# Patient Record
Sex: Male | Born: 1980 | Race: White | Hispanic: No | Marital: Single | Smoking: Current every day smoker
Health system: Southern US, Community
[De-identification: ages and names within clinical notes are randomized; demographics above are authoritative.]

## PROBLEM LIST (undated history)

## (undated) DIAGNOSIS — E785 Hyperlipidemia, unspecified: Secondary | ICD-10-CM

## (undated) DIAGNOSIS — F1010 Alcohol abuse, uncomplicated: Secondary | ICD-10-CM

## (undated) DIAGNOSIS — I609 Nontraumatic subarachnoid hemorrhage, unspecified: Secondary | ICD-10-CM

## (undated) DIAGNOSIS — Z765 Malingerer [conscious simulation]: Secondary | ICD-10-CM

## (undated) DIAGNOSIS — D1802 Hemangioma of intracranial structures: Secondary | ICD-10-CM

## (undated) DIAGNOSIS — G8929 Other chronic pain: Secondary | ICD-10-CM

## (undated) DIAGNOSIS — R45851 Suicidal ideations: Secondary | ICD-10-CM

---

## 2021-03-28 ENCOUNTER — Emergency Department (HOSPITAL_COMMUNITY): Payer: PRIVATE HEALTH INSURANCE

## 2021-03-28 ENCOUNTER — Inpatient Hospital Stay (HOSPITAL_COMMUNITY)
Admission: EM | Admit: 2021-03-28 | Discharge: 2021-04-02 | DRG: 082 | Disposition: A | Discharge status: Home / Self Care | Payer: PRIVATE HEALTH INSURANCE | Attending: Family Medicine | Admitting: Family Medicine

## 2021-03-28 ENCOUNTER — Emergency Department (HOSPITAL_COMMUNITY)
Admission: EM | Admit: 2021-03-28 | Discharge: 2021-03-28 | Discharge status: Absent without leave | Payer: Self-pay | Source: Home / Self Care | Attending: Emergency Medicine | Admitting: Emergency Medicine

## 2021-03-28 ENCOUNTER — Inpatient Hospital Stay (HOSPITAL_COMMUNITY): Payer: PRIVATE HEALTH INSURANCE

## 2021-03-28 ENCOUNTER — Encounter (HOSPITAL_COMMUNITY): Payer: Self-pay

## 2021-03-28 ENCOUNTER — Other Ambulatory Visit: Payer: Self-pay

## 2021-03-28 DIAGNOSIS — S0990XA Unspecified injury of head, initial encounter: Secondary | ICD-10-CM

## 2021-03-28 DIAGNOSIS — Z681 Body mass index (BMI) 19 or less, adult: Secondary | ICD-10-CM

## 2021-03-28 DIAGNOSIS — F10931 Alcohol use, unspecified with withdrawal delirium: Secondary | ICD-10-CM | POA: Diagnosis present

## 2021-03-28 DIAGNOSIS — S066X9A Traumatic subarachnoid hemorrhage with loss of consciousness of unspecified duration, initial encounter: Secondary | ICD-10-CM | POA: Diagnosis present

## 2021-03-28 DIAGNOSIS — I609 Nontraumatic subarachnoid hemorrhage, unspecified: Secondary | ICD-10-CM | POA: Diagnosis not present

## 2021-03-28 DIAGNOSIS — E871 Hypo-osmolality and hyponatremia: Secondary | ICD-10-CM | POA: Diagnosis present

## 2021-03-28 DIAGNOSIS — Z23 Encounter for immunization: Secondary | ICD-10-CM | POA: Diagnosis not present

## 2021-03-28 DIAGNOSIS — Z87891 Personal history of nicotine dependence: Secondary | ICD-10-CM

## 2021-03-28 DIAGNOSIS — I10 Essential (primary) hypertension: Secondary | ICD-10-CM | POA: Diagnosis present

## 2021-03-28 DIAGNOSIS — E43 Unspecified severe protein-calorie malnutrition: Secondary | ICD-10-CM | POA: Diagnosis present

## 2021-03-28 DIAGNOSIS — Z79899 Other long term (current) drug therapy: Secondary | ICD-10-CM | POA: Diagnosis not present

## 2021-03-28 DIAGNOSIS — Y92481 Parking lot as the place of occurrence of the external cause: Secondary | ICD-10-CM

## 2021-03-28 DIAGNOSIS — Y92009 Unspecified place in unspecified non-institutional (private) residence as the place of occurrence of the external cause: Secondary | ICD-10-CM

## 2021-03-28 DIAGNOSIS — W1839XA Other fall on same level, initial encounter: Secondary | ICD-10-CM | POA: Diagnosis present

## 2021-03-28 DIAGNOSIS — S0003XA Contusion of scalp, initial encounter: Secondary | ICD-10-CM | POA: Diagnosis present

## 2021-03-28 DIAGNOSIS — Z20822 Contact with and (suspected) exposure to covid-19: Secondary | ICD-10-CM | POA: Diagnosis present

## 2021-03-28 DIAGNOSIS — F10231 Alcohol dependence with withdrawal delirium: Principal | ICD-10-CM | POA: Diagnosis present

## 2021-03-28 DIAGNOSIS — E876 Hypokalemia: Secondary | ICD-10-CM | POA: Diagnosis present

## 2021-03-28 DIAGNOSIS — W19XXXA Unspecified fall, initial encounter: Secondary | ICD-10-CM

## 2021-03-28 DIAGNOSIS — R519 Headache, unspecified: Secondary | ICD-10-CM | POA: Diagnosis present

## 2021-03-28 LAB — CBC WITH DIFFERENTIAL/PLATELET
Abs Immature Granulocytes: 0.03 10*3/uL (ref 0.00–0.07)
Basophils Absolute: 0 10*3/uL (ref 0.0–0.1)
Basophils Relative: 0 %
Eosinophils Absolute: 0 10*3/uL (ref 0.0–0.5)
Eosinophils Relative: 0 %
HCT: 45.4 % (ref 39.0–52.0)
Hemoglobin: 16.1 g/dL (ref 13.0–17.0)
Immature Granulocytes: 0 %
Lymphocytes Relative: 9 %
Lymphs Abs: 0.8 10*3/uL (ref 0.7–4.0)
MCH: 32.2 pg (ref 26.0–34.0)
MCHC: 35.5 g/dL (ref 30.0–36.0)
MCV: 90.8 fL (ref 80.0–100.0)
Monocytes Absolute: 0.9 10*3/uL (ref 0.1–1.0)
Monocytes Relative: 10 %
Neutro Abs: 7.3 10*3/uL (ref 1.7–7.7)
Neutrophils Relative %: 81 %
Platelets: 120 10*3/uL — ABNORMAL LOW (ref 150–400)
RBC: 5 MIL/uL (ref 4.22–5.81)
RDW: 12.5 % (ref 11.5–15.5)
WBC: 9.1 10*3/uL (ref 4.0–10.5)
nRBC: 0 % (ref 0.0–0.2)

## 2021-03-28 LAB — CBC
HCT: 40.1 % (ref 39.0–52.0)
Hemoglobin: 13.9 g/dL (ref 13.0–17.0)
MCH: 31.9 pg (ref 26.0–34.0)
MCHC: 34.7 g/dL (ref 30.0–36.0)
MCV: 92 fL (ref 80.0–100.0)
Platelets: 104 10*3/uL — ABNORMAL LOW (ref 150–400)
RBC: 4.36 MIL/uL (ref 4.22–5.81)
RDW: 12.5 % (ref 11.5–15.5)
WBC: 6.2 10*3/uL (ref 4.0–10.5)
nRBC: 0 % (ref 0.0–0.2)

## 2021-03-28 LAB — HIV ANTIBODY (ROUTINE TESTING W REFLEX): HIV Screen 4th Generation wRfx: NONREACTIVE

## 2021-03-28 LAB — COMPREHENSIVE METABOLIC PANEL
ALT: 42 U/L (ref 0–44)
AST: 50 U/L — ABNORMAL HIGH (ref 15–41)
Albumin: 4.5 g/dL (ref 3.5–5.0)
Alkaline Phosphatase: 94 U/L (ref 38–126)
Anion gap: 19 — ABNORMAL HIGH (ref 5–15)
BUN: 6 mg/dL (ref 6–20)
CO2: 23 mmol/L (ref 22–32)
Calcium: 9.4 mg/dL (ref 8.9–10.3)
Chloride: 87 mmol/L — ABNORMAL LOW (ref 98–111)
Creatinine, Ser: 0.69 mg/dL (ref 0.61–1.24)
GFR, Estimated: >60 mL/min (ref >60)
Glucose, Bld: 142 mg/dL — ABNORMAL HIGH (ref 70–99)
Potassium: 3 mmol/L — ABNORMAL LOW (ref 3.5–5.1)
Sodium: 129 mmol/L — ABNORMAL LOW (ref 135–145)
Total Bilirubin: 2.8 mg/dL — ABNORMAL HIGH (ref 0.3–1.2)
Total Protein: 7.8 g/dL (ref 6.5–8.1)

## 2021-03-28 LAB — CREATININE, SERUM
Creatinine, Ser: 0.58 mg/dL — ABNORMAL LOW (ref 0.61–1.24)
GFR, Estimated: >60 mL/min (ref >60)

## 2021-03-28 LAB — URINALYSIS, ROUTINE W REFLEX MICROSCOPIC
Bilirubin Urine: NEGATIVE
Glucose, UA: NEGATIVE mg/dL
Ketones, ur: 5 mg/dL — AB
Leukocytes,Ua: NEGATIVE
Nitrite: NEGATIVE
Protein, ur: 100 mg/dL — AB
Specific Gravity, Urine: >1.046 — ABNORMAL HIGH (ref 1.005–1.030)
pH: 6 (ref 5.0–8.0)

## 2021-03-28 LAB — PHOSPHORUS: Phosphorus: 2.7 mg/dL (ref 2.5–4.6)

## 2021-03-28 LAB — MRSA NEXT GEN BY PCR, NASAL: MRSA by PCR Next Gen: NOT DETECTED

## 2021-03-28 LAB — MAGNESIUM: Magnesium: 1.5 mg/dL — ABNORMAL LOW (ref 1.7–2.4)

## 2021-03-28 LAB — ETHANOL: Alcohol, Ethyl (B): <10 mg/dL (ref <10)

## 2021-03-28 MED ORDER — ORAL CARE MOUTH RINSE
15.0000 mL | Freq: Two times a day (BID) | OROMUCOSAL | Status: DC
  Administered 2021-03-29 – 2021-04-02 (×6): 15 mL via OROMUCOSAL

## 2021-03-28 MED ORDER — POLYETHYLENE GLYCOL 3350 17 G PO PACK: 17.0000 g | PACK | Freq: Every day | ORAL | Status: DC | PRN

## 2021-03-28 MED ORDER — ENOXAPARIN SODIUM 40 MG/0.4ML IJ SOSY: 40.0000 mg | PREFILLED_SYRINGE | INTRAMUSCULAR | Status: DC

## 2021-03-28 MED ORDER — SODIUM CHLORIDE 0.9 % IV BOLUS
500.0000 mL | Freq: Once | INTRAVENOUS | Status: AC
  Administered 2021-03-28: 500 mL via INTRAVENOUS

## 2021-03-28 MED ORDER — POTASSIUM CHLORIDE CRYS ER 20 MEQ PO TBCR: 40.0000 meq | EXTENDED_RELEASE_TABLET | Freq: Two times a day (BID) | ORAL | Status: DC

## 2021-03-28 MED ORDER — PANTOPRAZOLE SODIUM 40 MG PO TBEC: 40.0000 mg | DELAYED_RELEASE_TABLET | Freq: Every day | ORAL | Status: DC

## 2021-03-28 MED ORDER — ADULT MULTIVITAMIN W/MINERALS CH: 1.0000 | ORAL_TABLET | Freq: Every day | ORAL | Status: DC

## 2021-03-28 MED ORDER — SODIUM CHLORIDE 0.9 % IV SOLN: INTRAVENOUS | Status: DC

## 2021-03-28 MED ORDER — SODIUM CHLORIDE (PF) 0.9 % IJ SOLN
INTRAMUSCULAR | Status: AC
  Filled 2021-03-28: qty 50

## 2021-03-28 MED ORDER — ENSURE ENLIVE PO LIQD
237.0000 mL | Freq: Two times a day (BID) | ORAL | Status: DC
  Administered 2021-03-29: 237 mL via ORAL

## 2021-03-28 MED ORDER — CHLORHEXIDINE GLUCONATE CLOTH 2 % EX PADS
6.0000 | MEDICATED_PAD | Freq: Every day | CUTANEOUS | Status: DC
  Administered 2021-03-28 – 2021-03-31 (×4): 6 via TOPICAL

## 2021-03-28 MED ORDER — LORAZEPAM 1 MG PO TABS: 1.0000 mg | ORAL_TABLET | ORAL | Status: DC | PRN

## 2021-03-28 MED ORDER — LORAZEPAM 2 MG/ML IJ SOLN: 0.0000 mg | Freq: Two times a day (BID) | INTRAMUSCULAR | Status: DC

## 2021-03-28 MED ORDER — LORAZEPAM 2 MG/ML IJ SOLN
0.0000 mg | Freq: Four times a day (QID) | INTRAMUSCULAR | Status: DC
  Filled 2021-03-28: qty 2

## 2021-03-28 MED ORDER — FOLIC ACID 1 MG PO TABS: 1.0000 mg | ORAL_TABLET | Freq: Every day | ORAL | Status: DC

## 2021-03-28 MED ORDER — DEXTROSE IN LACTATED RINGERS 5 % IV SOLN: INTRAVENOUS | Status: DC

## 2021-03-28 MED ORDER — DOCUSATE SODIUM 100 MG PO CAPS: 100.0000 mg | ORAL_CAPSULE | Freq: Two times a day (BID) | ORAL | Status: DC | PRN

## 2021-03-28 MED ORDER — ADULT MULTIVITAMIN LIQUID CH
15.0000 mL | Freq: Every day | ORAL | Status: DC
  Administered 2021-03-28 – 2021-03-29 (×2): 15 mL via ORAL
  Filled 2021-03-28 (×2): qty 15

## 2021-03-28 MED ORDER — THIAMINE HCL 100 MG/ML IJ SOLN
100.0000 mg | Freq: Every day | INTRAMUSCULAR | Status: DC
  Administered 2021-03-28: 100 mg via INTRAVENOUS
  Filled 2021-03-28: qty 2

## 2021-03-28 MED ORDER — FOLIC ACID 1 MG PO TABS
1.0000 mg | ORAL_TABLET | Freq: Every day | ORAL | Status: DC
  Administered 2021-03-29 – 2021-04-02 (×5): 1 mg via ORAL
  Filled 2021-03-28 (×6): qty 1

## 2021-03-28 MED ORDER — NIMODIPINE 6 MG/ML PO SOLN
60.0000 mg | ORAL | Status: DC
  Administered 2021-03-28 – 2021-03-29 (×6): 60 mg via ORAL
  Filled 2021-03-28 (×9): qty 10

## 2021-03-28 MED ORDER — MAGNESIUM SULFATE 2 GM/50ML IV SOLN
2.0000 g | Freq: Once | INTRAVENOUS | Status: AC
  Administered 2021-03-28: 2 g via INTRAVENOUS
  Filled 2021-03-28: qty 50

## 2021-03-28 MED ORDER — LABETALOL HCL 5 MG/ML IV SOLN
20.0000 mg | INTRAVENOUS | Status: DC | PRN
  Administered 2021-03-30: 20 mg via INTRAVENOUS
  Filled 2021-03-28: qty 4

## 2021-03-28 MED ORDER — THIAMINE HCL 100 MG/ML IJ SOLN: 100.0000 mg | Freq: Every day | INTRAMUSCULAR | Status: DC

## 2021-03-28 MED ORDER — IOHEXOL 350 MG/ML SOLN
100.0000 mL | Freq: Once | INTRAVENOUS | Status: AC | PRN
  Administered 2021-03-28: 100 mL via INTRAVENOUS

## 2021-03-28 MED ORDER — LORAZEPAM 2 MG/ML IJ SOLN
1.0000 mg | Freq: Once | INTRAMUSCULAR | Status: DC
Start: 2021-03-28 — End: 2021-03-28
  Filled 2021-03-28: qty 1

## 2021-03-28 MED ORDER — POTASSIUM CHLORIDE 20 MEQ PO PACK
40.0000 meq | PACK | ORAL | Status: AC
  Administered 2021-03-28 (×2): 40 meq via ORAL
  Filled 2021-03-28 (×2): qty 2

## 2021-03-28 MED ORDER — LORAZEPAM 2 MG/ML IJ SOLN
1.0000 mg | INTRAMUSCULAR | Status: DC | PRN
Start: 2021-03-28 — End: 2021-03-29
  Administered 2021-03-28 (×2): 1 mg via INTRAVENOUS

## 2021-03-28 MED ORDER — LORAZEPAM 2 MG/ML IJ SOLN: 1.0000 mg | INTRAMUSCULAR | Status: DC | PRN

## 2021-03-28 MED ORDER — THIAMINE HCL 100 MG PO TABS: 100.0000 mg | ORAL_TABLET | Freq: Every day | ORAL | Status: DC

## 2021-03-28 MED ORDER — DEXMEDETOMIDINE HCL IN NACL 200 MCG/50ML IV SOLN
0.2000 ug/kg/h | INTRAVENOUS | Status: DC
  Administered 2021-03-28 – 2021-03-29 (×2): 0.2 ug/kg/h via INTRAVENOUS
  Filled 2021-03-28 (×3): qty 50

## 2021-03-28 MED ORDER — PNEUMOCOCCAL VAC POLYVALENT 25 MCG/0.5ML IJ INJ
0.5000 mL | INJECTION | INTRAMUSCULAR | Status: DC
  Filled 2021-03-28: qty 0.5

## 2021-03-28 MED ORDER — PANTOPRAZOLE SODIUM 40 MG PO PACK
40.0000 mg | PACK | Freq: Every day | ORAL | Status: DC
  Administered 2021-03-28: 40 mg
  Filled 2021-03-28 (×2): qty 20

## 2021-03-28 MED ORDER — LEVETIRACETAM 100 MG/ML PO SOLN
500.0000 mg | Freq: Two times a day (BID) | ORAL | Status: DC
  Administered 2021-03-28 – 2021-03-29 (×3): 500 mg via ORAL
  Filled 2021-03-28 (×4): qty 5

## 2021-03-28 MED ORDER — THIAMINE HCL 100 MG PO TABS
100.0000 mg | ORAL_TABLET | Freq: Every day | ORAL | Status: DC
  Administered 2021-03-29 – 2021-04-02 (×5): 100 mg via ORAL
  Filled 2021-03-28 (×5): qty 1

## 2021-03-28 MED ORDER — LORAZEPAM 2 MG/ML IJ SOLN
0.0000 mg | Freq: Four times a day (QID) | INTRAMUSCULAR | Status: DC
  Administered 2021-03-28: 1 mg via INTRAVENOUS
  Administered 2021-03-28: 2 mg via INTRAVENOUS
  Administered 2021-03-28: 3 mg via INTRAVENOUS
  Filled 2021-03-28 (×3): qty 1

## 2021-03-28 NOTE — Progress Notes (Signed)
[facility] Progress Note Patient Name: Charles Graves DOB: 1981/03/10 MRN: [id number]   Date of Service  03/28/2021  HPI/Events of Note  Patient not tested for COVID on admission. Nursing request for order for COVID screening test. Patient asymptomatic.   eICU Interventions  Plan: 6 hour TAT PCR COVID test.      Intervention Category Major Interventions: Other:  [name] 03/28/2021, 9:16 PM

## 2021-03-28 NOTE — ED Provider Notes (Signed)
[facility] DEPT Provider Note   CSN: [id number] Arrival date & time: 03/28/21  3532     History No chief complaint on file.   Charles Graves is a 40 y.o. male.  Level 5 caveat for altered mental status.  Patient brought by EMS after fall.  Fall apparently occurred 12 hours ago.  Patient does not recall details.  States he fell backwards and struck his head and his having pain in his head and upper back.  States he was trying to detox from alcohol on his own and has not had anything to drink in the past 5 days.  He normally drinks 12 pack of beer a day.  States he did have 3 beers last night and attempt to sleep.  He endorses feeling shaky and believes someone is spying on him and he is seeing someone at the doorway to his room.  He denies any other drug use.  States he has a history of hypertension but has not been a has an unloader pain for a month or more.  Denies any chest pain or shortness of breath.  No abdominal pain, nausea or vomiting.  No focal weakness, numbness or tingling.  The history is provided by the patient.      No past medical history on file.  There are no problems to display for this patient.   * The histories are not reviewed yet. Please review them in the "History" navigator section and refresh this [place].     No family history on file.     Home Medications Prior to Admission medications   Not on File    Allergies    Patient has no allergy information on record.  Review of Systems   Review of Systems  Unable to perform ROS: Mental status change   Physical Exam Updated Vital Signs BP (!) 189/129   Pulse (!) 145   Temp 98 F (36.7 C)   Resp (!) 22   SpO2 100%   Physical Exam Vitals and nursing note reviewed.  Constitutional:      General: He is in acute distress.     Appearance: He is well-developed.     Comments: Tremulous and paranoid.  Tachycardic to the 150s. Refusing to stay in bed because he is  concerned someone is trying to look through the doorway at him  HENT:     Head: Normocephalic.     Comments: 3 cm hematoma and abrasion to the left occipital scalp    Mouth/Throat:     Pharynx: No oropharyngeal exudate.  Eyes:     Conjunctiva/sclera: Conjunctivae normal.     Pupils: Pupils are equal, round, and reactive to light.  Neck:     Comments: No meningismus. Cardiovascular:     Rate and Rhythm: Regular rhythm.     Heart sounds: Normal heart sounds. No murmur heard. Pulmonary:     Effort: Pulmonary effort is normal. No respiratory distress.     Breath sounds: Normal breath sounds.  Abdominal:     Palpations: Abdomen is soft.     Tenderness: There is no abdominal tenderness. There is no guarding or rebound.  Musculoskeletal:        General: Tenderness present. Normal range of motion.     Cervical back: Normal range of motion and neck supple.     Comments: Abrasion to mid thoracic spine no step-off  Skin:    General: Skin is warm.  Neurological:     Mental Status: He  is alert.     Cranial Nerves: No cranial nerve deficit.     Motor: No abnormal muscle tone.     Coordination: Coordination normal.     Comments: Oriented to person place and time but having hallucinations.  Answers questions appropriately but is seeing people in the room who are not there.  Moves all extremities equally.  5/5 strength throughout, cranial nerves II to XII intact  Psychiatric:        Behavior: Behavior normal.    ED Results / Procedures / Treatments   Labs (all labs ordered are listed, but only abnormal results are displayed) Labs Reviewed  CBC WITH DIFFERENTIAL/PLATELET - Abnormal; Notable for the following components:      Result Value   Platelets 120 (*)    All other components within normal limits  COMPREHENSIVE METABOLIC PANEL - Abnormal; Notable for the following components:   Sodium 129 (*)    Potassium 3.0 (*)    Chloride 87 (*)    Glucose, Bld 142 (*)    AST 50 (*)    Total  Bilirubin 2.8 (*)    Anion gap 19 (*)    All other components within normal limits  MAGNESIUM - Abnormal; Notable for the following components:   Magnesium 1.5 (*)    All other components within normal limits  ETHANOL  PHOSPHORUS    EKG None  Radiology No results found.  Procedures .Critical Care  Date/Time: 03/28/2021 7:26 AM Performed by: Ezequiel Essex, MD Authorized by: Ezequiel Essex, MD   Critical care provider statement:    Critical care time (minutes):  60   Critical care was necessary to treat or prevent imminent or life-threatening deterioration of the following conditions: delirium tremens.   Critical care was time spent personally by me on the following activities:  Discussions with consultants, evaluation of patient's response to treatment, examination of patient, ordering and performing treatments and interventions, ordering and review of laboratory studies, ordering and review of radiographic studies, pulse oximetry, re-evaluation of patient's condition, obtaining history from patient or surrogate and review of old charts   Medications Ordered in ED Medications  LORazepam (ATIVAN) tablet 1-4 mg (has no administration in time range)    Or  LORazepam (ATIVAN) injection 1-4 mg (has no administration in time range)  thiamine tablet 100 mg (has no administration in time range)    Or  thiamine (B-1) injection 100 mg (has no administration in time range)  folic acid (FOLVITE) tablet 1 mg (has no administration in time range)  multivitamin with minerals tablet 1 tablet (has no administration in time range)  LORazepam (ATIVAN) injection 0-4 mg (has no administration in time range)    Followed by  LORazepam (ATIVAN) injection 0-4 mg (has no administration in time range)    ED Course  I have reviewed the triage vital signs and the nursing notes.  Pertinent labs & imaging results that were available during my care of the patient were reviewed by me and considered  in my medical decision making (see chart for details).    MDM Rules/Calculators/A&P                         Fall with alcohol withdrawals and delirium tremens.  Patient is tachycardic, tremulous, hypertensive, hallucinating.  IV ativan, CIWA Protocol initiated.  CT head, C spine, T spine.   IV fluids, thiamine folate.  Patient paranoid, hallucinating, cowering in corner of room. Believes his ex-coworker is "around  the corner with two guns". CIWA scores elevated.  Patient has received 6 mg ativan so far with some improvement.  Will initiate precedex gttt.  Admission d/w Theadora Rama NP for CCM.  CT head delayed. Found to have small SAH, likely post traumatic but concern for possible vascular density at R MCA. CTA recommended to r/o aneurysm.  Critical care team aware and will arrange transfer to [facility] neuro ICU. Protecting airway.    Final Clinical Impression(s) / ED Diagnoses Final diagnoses:  Delirium tremens ([place])  Fall in home, initial encounter  Closed head injury, initial encounter    Rx / DC Orders ED Discharge Orders     None        Darnette Lampron, Annie Main, MD 03/28/21 [phone]

## 2021-03-28 NOTE — Progress Notes (Signed)
Unable to get a bed at neuro ICU at present  Will admit to [facility] ICU

## 2021-03-28 NOTE — Progress Notes (Signed)
Ct head shows no aneurysmal dilatation

## 2021-03-28 NOTE — ED Triage Notes (Signed)
Pt to ED by EMS from hotel he resides in. Pt was sitting on a curb yesterday around 1700 when he fell backwards from a sitting position and hit his head, mild bleeding and moderate swelling noted. Pt is also actively withdrawing from alcohol and arrives with a considerable tremor and audial and visual hallucinations.

## 2021-03-28 NOTE — ED Triage Notes (Signed)
Pt to ED by EMS from [facility] with c/o fall. Pt was sitting on a curb last night when he fell backwards and struck the back of his head, denies any LOC or neck or back pain. Pt is currently going through alcohol withdrawal and is experiencing visual hallucinations. Fall occurred at [place] yesterday.

## 2021-03-28 NOTE — H&P (Signed)
NAME:  Charles Graves, MRN:  [id number], DOB:  [birth year], LOS: 0 ADMISSION DATE:  03/28/2021, CONSULTATION DATE:  03/28/2021 REFERRING MD:  Dr Wyvonnia Dusky, CHIEF COMPLAINT:  Alcohol withdrawal with Delirium tremens   History of Present Illness:  Patient brought in by EMS following a fall Quirt drinking cold [place] 5 days ago Having auditory and visual hallucinations [place] about 12 hours prior to presentation  Hypertensive, tachycardic  Denies any pain, no focal weakness on exam  History of HTN  Pertinent  Medical History  HTN  Significant Hospital Events: Including procedures, antibiotic start and stop dates in addition to other pertinent events   SAH on CT head  Interim History / Subjective:  Feels fair, no pain or discomfort, denies a HA t present  Objective   Blood pressure 128/86, pulse (!) 126, temperature 98 F (36.7 C), resp. rate (!) 24, height 5\' 6"  (1.676 m), weight 59.9 kg, SpO2 99 %.       No intake or output data in the 24 hours ending 03/28/21 0805 Filed Weights   03/28/21 0722  Weight: 59.9 kg    Examination: General: middle aged, comfortable HENT: moist oral mucosa Lungs: clear breath sounds Cardiovascular: S1S2 appreciated Abdomen: BS appreciated, no hepatomegaly Extremities: no edema Neuro: awake, alert, moving all extremities, following commands GU: fair output  Resolved Hospital Problem list     Assessment & Plan:  Delirium tremens -hallucinations,tremulous,tachycardic, hypertensive  Alcohol withdrawal - daily drinker, quit 5 days ago cold [place]  Sub arachnoid hemorrhage secondary to fall - concern for aneurysm  Hx of HTN - was on Amlodipine  . Admit to neuro ICU . Obtain CTA . Precedex . Correct lytes . Anti seizure prophylaxis . Nimotop . BP control- MAP<160 . Stool softeners . MVI . Thiamine . Analgesia as needed . Saline drip  Easily arousable and interactive, able to protect his airway    Best Practice (right click and  "Reselect all SmartList Selections" daily)   Diet/type: Regular consistency (see orders) DVT prophylaxis: SCD GI prophylaxis: PPI Lines: N/A Foley:  N/A Code Status:  full code Last date of multidisciplinary goals of care discussion [pending]  Labs   CBC: Recent Labs  Lab 03/28/21 0610  WBC 9.1  NEUTROABS 7.3  HGB 16.1  HCT 45.4  MCV 90.8  PLT 120*    Basic Metabolic Panel: Recent Labs  Lab 03/28/21 0610  NA 129*  K 3.0*  CL 87*  CO2 23  GLUCOSE 142*  BUN 6  CREATININE 0.69  CALCIUM 9.4  MG 1.5*  PHOS 2.7   GFR: Estimated Creatinine Clearance: 103 mL/min (by C-G formula based on SCr of 0.69 mg/dL). Recent Labs  Lab 03/28/21 0610  WBC 9.1    Liver Function Tests: Recent Labs  Lab 03/28/21 0610  AST 50*  ALT 42  ALKPHOS 94  BILITOT 2.8*  PROT 7.8  ALBUMIN 4.5   No results for input(s): LIPASE, AMYLASE in the last 168 hours. No results for input(s): AMMONIA in the last 168 hours.  ABG No results found for: PHART, PCO2ART, PO2ART, HCO3, TCO2, ACIDBASEDEF, O2SAT   Coagulation Profile: No results for input(s): INR, PROTIME in the last 168 hours.  Cardiac Enzymes: No results for input(s): CKTOTAL, CKMB, CKMBINDEX, TROPONINI in the last 168 hours.  HbA1C: No results found for: HGBA1C  CBG: No results for input(s): GLUCAP in the last 168 hours.  Review of Systems:   Denies HA, no pain or discomfort  Past Medical History:  He,  has no past medical history on file.   Surgical History:  History reviewed. No pertinent surgical history.   Social History:      Family History:  His family history is not on file.   Allergies No Known Allergies   Home Medications  Prior to Admission medications   Not on File     Critical care time: 30    Risk of decompensation is very high with his SAH and alcohol withdrawal

## 2021-03-28 NOTE — ED Notes (Addendum)
Unable to obtain EKG due to extreme shaking.

## 2021-03-29 ENCOUNTER — Encounter (HOSPITAL_COMMUNITY): Payer: Self-pay | Admitting: Pulmonary Disease

## 2021-03-29 DIAGNOSIS — E43 Unspecified severe protein-calorie malnutrition: Secondary | ICD-10-CM | POA: Insufficient documentation

## 2021-03-29 LAB — CBC
HCT: 39 % (ref 39.0–52.0)
Hemoglobin: 13.2 g/dL (ref 13.0–17.0)
MCH: 32.2 pg (ref 26.0–34.0)
MCHC: 33.8 g/dL (ref 30.0–36.0)
MCV: 95.1 fL (ref 80.0–100.0)
Platelets: 100 10*3/uL — ABNORMAL LOW (ref 150–400)
RBC: 4.1 MIL/uL — ABNORMAL LOW (ref 4.22–5.81)
RDW: 12.7 % (ref 11.5–15.5)
WBC: 5.3 10*3/uL (ref 4.0–10.5)
nRBC: 0 % (ref 0.0–0.2)

## 2021-03-29 LAB — BASIC METABOLIC PANEL
Anion gap: 9 (ref 5–15)
BUN: 7 mg/dL (ref 6–20)
CO2: 22 mmol/L (ref 22–32)
Calcium: 8.4 mg/dL — ABNORMAL LOW (ref 8.9–10.3)
Chloride: 102 mmol/L (ref 98–111)
Creatinine, Ser: 0.42 mg/dL — ABNORMAL LOW (ref 0.61–1.24)
GFR, Estimated: >60 mL/min (ref >60)
Glucose, Bld: 94 mg/dL (ref 70–99)
Potassium: 3.4 mmol/L — ABNORMAL LOW (ref 3.5–5.1)
Sodium: 133 mmol/L — ABNORMAL LOW (ref 135–145)

## 2021-03-29 LAB — C DIFFICILE QUICK SCREEN W PCR REFLEX
C Diff antigen: NEGATIVE
C Diff interpretation: NOT DETECTED
C Diff toxin: NEGATIVE

## 2021-03-29 LAB — SARS CORONAVIRUS 2 (TAT 6-24 HRS): SARS Coronavirus 2: NEGATIVE

## 2021-03-29 MED ORDER — LORAZEPAM 1 MG PO TABS
1.0000 mg | ORAL_TABLET | Freq: Two times a day (BID) | ORAL | Status: AC
  Administered 2021-03-29 – 2021-03-30 (×4): 1 mg via ORAL
  Filled 2021-03-29 (×4): qty 1

## 2021-03-29 MED ORDER — POTASSIUM CHLORIDE CRYS ER 20 MEQ PO TBCR
40.0000 meq | EXTENDED_RELEASE_TABLET | Freq: Once | ORAL | Status: AC
  Administered 2021-03-29: 40 meq via ORAL
  Filled 2021-03-29: qty 2

## 2021-03-29 MED ORDER — ADULT MULTIVITAMIN W/MINERALS CH
1.0000 | ORAL_TABLET | Freq: Every day | ORAL | Status: DC
  Administered 2021-03-30 – 2021-04-02 (×4): 1 via ORAL
  Filled 2021-03-29 (×4): qty 1

## 2021-03-29 MED ORDER — PROSOURCE PLUS PO LIQD
30.0000 mL | Freq: Every day | ORAL | Status: DC
  Administered 2021-03-29 – 2021-03-31 (×3): 30 mL via ORAL
  Filled 2021-03-29 (×4): qty 30

## 2021-03-29 MED ORDER — PANTOPRAZOLE SODIUM 40 MG PO TBEC
40.0000 mg | DELAYED_RELEASE_TABLET | Freq: Every day | ORAL | Status: DC
  Administered 2021-03-29 – 2021-04-02 (×5): 40 mg via ORAL
  Filled 2021-03-29 (×5): qty 1

## 2021-03-29 MED ORDER — ENSURE ENLIVE PO LIQD
237.0000 mL | Freq: Two times a day (BID) | ORAL | Status: DC
  Administered 2021-03-29 – 2021-04-02 (×8): 237 mL via ORAL

## 2021-03-29 NOTE — Progress Notes (Signed)
Initial Nutrition Assessment  DOCUMENTATION CODES:   Underweight, Severe malnutrition in context of social or environmental circumstances  INTERVENTION:  - continue Ensure Enlive po BID, each supplement provides 350 kcal and 20 grams of protein - will order 30 ml Prosource Plus once/day, each supplement provides 100 kcal and 15 grams protein.    NUTRITION DIAGNOSIS:   Severe Malnutrition related to social / environmental circumstances (alcohol abuse) as evidenced by moderate fat depletion, moderate muscle depletion, severe fat depletion, severe muscle depletion.  GOAL:   Patient will meet greater than or equal to 90% of their needs  MONITOR:   PO intake, Supplement acceptance, Labs, Weight trends  REASON FOR ASSESSMENT:   Malnutrition Screening Tool  ASSESSMENT:   40 year-old male with medical history of alcohol abuse. Patient presented to the ED after a fall. He quit drinking cold [place] 5 days PTA.  Patient sitting in the chair with no family or visitors present. His responses are slightly delayed and his speech is slightly delayed.  Patient discussed in rounds this AM and with RN after visit to patient's room.   Patient reports that he ate 100% of breakfast this AM which consisted of pancakes and bacon. He denies abdominal pain/pressure or nausea after eating.   Prior to stopping drinking, he had a great appetite. He would graze throughout the day and eat a large dinner meal each day. Since he stopped drinking, he has been eating and drinking non-alcoholic beverages very minimally. He was feeling very nauseous during those days and experienced vomiting x1 after drinking chocolate milk.  He reports having some muscle cramp-like discomfort to calves intermittently PTA, but has not experienced that today. He reports severe R hip pain that "feels like it is breaking in half." Let RN know.  He weighs himself at home and the last time was 2 weeks ago at which time he weighed 133  lb. Reports UBW of 132-135 lb and that he has weighed this for a long time.   Weight yesterday documented as both 119 lb and 132 lb. No other weight recordings in the chart.    Labs reviewed; Na: 133 mmol/l, K: 3.4 mmol/l, creatinine: 0.42 mg/dl, Ca: 8.4 mg/dl. Medications reviewed; 1 mg folvite/day, 1 mg ativan BID, 1 tablet multivitamin with minerals/day, 40 mg oral protonix/day, 40 mEq Klor-Con x2 doses 7/18 and x1 dose 7/19, 100 mg oral thiamine/day.  IVF; NS @ 100 ml/hr.     NUTRITION - FOCUSED PHYSICAL EXAM:  Flowsheet Row Most Recent Value  Orbital Region Moderate depletion  Upper Arm Region Severe depletion  Thoracic and Lumbar Region Unable to assess  Buccal Region Moderate depletion  Temple Region Moderate depletion  Clavicle Bone Region Moderate depletion  Clavicle and Acromion Bone Region Severe depletion  Scapular Bone Region Unable to assess  Dorsal Hand Moderate depletion  Patellar Region Severe depletion  Anterior Thigh Region Severe depletion  Posterior Calf Region Severe depletion  Edema (RD Assessment) None  Hair Reviewed  Eyes Reviewed  Mouth Reviewed  Skin Reviewed  Nails Reviewed       Diet Order:   Diet Order             Diet Carb Modified Fluid consistency: Thin; Room service appropriate? Yes  Diet effective now                   EDUCATION NEEDS:   Not appropriate for education at this time  Skin:  Skin Assessment: Reviewed RN Assessment  Last  BM:  7/19 x2  Height:   Ht Readings from Last 1 Encounters:  03/28/21 5\' 8"  (1.727 m)    Weight:   Wt Readings from Last 1 Encounters:  03/28/21 54.2 kg      Estimated Nutritional Needs:  Kcal:  1800-2000 kcal Protein:  90-105 grams Fluid:  >/= 2 L/day     Jarome Matin, MS, RD, LDN, CNSC Inpatient Clinical Dietitian RD pager # available in [facility]  After hours/weekend pager # available in [facility]

## 2021-03-29 NOTE — Progress Notes (Signed)
Did discuss with neurosurgery, CT reviewed  Trivial amount of hemorrhage  Patient has remained hemodynamically stable  Will discontinue the Nimotop, discontinue Keppra  Continue management for his DTs  Will continue to follow closely

## 2021-03-29 NOTE — Progress Notes (Signed)
NAME:  Charles Graves, MRN:  [id number], DOB:  1980/12/19, LOS: 1 ADMISSION DATE:  03/28/2021, CONSULTATION DATE:  03/28/2021 REFERRING MD:  Dr Wyvonnia Dusky, CHIEF COMPLAINT:  Alcohol withdrawal with Delirium tremens   History of Present Illness:  Patient brought in by EMS following a fall Quirt drinking cold [place] 5 days ago Having auditory and visual hallucinations [place] about 12 hours prior to presentation  Hypertensive, tachycardic  Denies any pain, no focal weakness on exam  History of HTN  Pertinent  Medical History  HTN  Significant Hospital Events: Including procedures, antibiotic start and stop dates in addition to other pertinent events   SAH on CT head 7/19-stable  Interim History / Subjective:  No overnight events Still on low-dose Precedex Denies pain or discomfort, does not appear agitated  Objective   Blood pressure 100/79, pulse 61, temperature (!) 97.1 F (36.2 C), temperature source Axillary, resp. rate 11, height 5\' 8"  (1.727 m), weight 54.2 kg, SpO2 98 %.        Intake/Output Summary (Last 24 hours) at 03/29/2021 0750 Last data filed at 03/29/2021 0442 Gross per 24 hour  Intake 926.6 ml  Output 1000 ml  Net -73.4 ml   Filed Weights   03/28/21 0722 03/28/21 1755  Weight: 59.9 kg 54.2 kg    Examination: General: Middle-aged, comfortable HENT: Moist oral mucosa, anicteric Lungs: Clear breath sounds bilaterally Cardiovascular: S1-S2 appreciated, no murmur Abdomen: Bowel sounds appreciated Extremities: No clubbing, no edema Neuro: Moving all extremities with no focal findings GU: fair output  Resolved Hospital Problem list     Assessment & Plan:   Delirium tremens -Appears to be settling -Hypotension resolved, tachycardia resolved -Convert to oral medications  Alcohol withdrawal -Was a daily drinker, quit cold [place] 5 days prior to presentation  Subarachnoid hemorrhage secondary to fall -CTA negative for aneurysmal dilatation -On  antiseizure prophylaxis -On nimodipine GCS -15  History of hypertension -Continue to monitor  .  Wean off Precedex .  Ativan 1 mg p.o. twice daily .  Continue CIWA .  Blood pressure control, maintain MAP less than 160 .  Analgesia as needed .  Continue MVI .  Continue thiamine .  Advance diet  Best Practice (right click and "Reselect all SmartList Selections" daily)   Diet/type: Regular consistency (see orders) DVT prophylaxis: SCD GI prophylaxis: PPI Lines: N/A Foley:  N/A Code Status:  full code Last date of multidisciplinary goals of care discussion [pending]  Labs   CBC: Recent Labs  Lab 03/28/21 0610 03/28/21 1702 03/29/21 0253  WBC 9.1 6.2 5.3  NEUTROABS 7.3  --   --   HGB 16.1 13.9 13.2  HCT 45.4 40.1 39.0  MCV 90.8 92.0 95.1  PLT 120* 104* 100*    Basic Metabolic Panel: Recent Labs  Lab 03/28/21 0610 03/28/21 1702 03/29/21 0253  NA 129*  --  133*  K 3.0*  --  3.4*  CL 87*  --  102  CO2 23  --  22  GLUCOSE 142*  --  94  BUN 6  --  7  CREATININE 0.69 0.58* 0.42*  CALCIUM 9.4  --  8.4*  MG 1.5*  --   --   PHOS 2.7  --   --    GFR: Estimated Creatinine Clearance: 93.2 mL/min (A) (by C-G formula based on SCr of 0.42 mg/dL (L)). Recent Labs  Lab 03/28/21 0610 03/28/21 1702 03/29/21 0253  WBC 9.1 6.2 5.3    Liver Function Tests: Recent Labs  Lab 03/28/21 0610  AST 50*  ALT 42  ALKPHOS 94  BILITOT 2.8*  PROT 7.8  ALBUMIN 4.5   No results for input(s): LIPASE, AMYLASE in the last 168 hours. No results for input(s): AMMONIA in the last 168 hours.  ABG No results found for: PHART, PCO2ART, PO2ART, HCO3, TCO2, ACIDBASEDEF, O2SAT   Coagulation Profile: No results for input(s): INR, PROTIME in the last 168 hours.  Cardiac Enzymes: No results for input(s): CKTOTAL, CKMB, CKMBINDEX, TROPONINI in the last 168 hours.  HbA1C: No results found for: HGBA1C  CBG: No results for input(s): GLUCAP in the last 168 hours.  Review of  Systems:   Denies HA, no pain or discomfort  Past Medical History:  He,  has no past medical history on file.   Surgical History:  History reviewed. No pertinent surgical history.   Social History:      Family History:  His family history is not on file.   Allergies No Known Allergies   Home Medications  Prior to Admission medications   Not on File     Critical care time: 65

## 2021-03-29 NOTE — Progress Notes (Signed)
[facility] ADULT ICU REPLACEMENT PROTOCOL   The patient does apply for the [facility] Adult ICU Electrolyte Replacment Protocol based on the criteria listed below:   1.Exclusion criteria: TCTS patients, ECMO patients and Hypothermia Protocol, and   Dialysis patients 2. Is GFR >/= 30 ml/min? Yes.    Patient's GFR today is >60 3. Is SCr </= 2? No. Patient's SCr is 0.42 mg/dL 4. Did SCr increase >/= 0.5 in 24 hours? No. 5.Pt's weight >40kg  Yes.   6. Abnormal electrolyte(s): K+ 3.4  7. Electrolytes replaced per protocol 8.  Call MD STAT for K+ </= 2.5, Phos </= 1, or Mag </= 1 Physician:  n/a  Charles Graves 03/29/2021 4:02 AM

## 2021-03-30 ENCOUNTER — Encounter (HOSPITAL_COMMUNITY): Payer: Self-pay | Admitting: Pulmonary Disease

## 2021-03-30 DIAGNOSIS — E43 Unspecified severe protein-calorie malnutrition: Secondary | ICD-10-CM

## 2021-03-30 LAB — COMPREHENSIVE METABOLIC PANEL
ALT: 45 U/L — ABNORMAL HIGH (ref 0–44)
AST: 66 U/L — ABNORMAL HIGH (ref 15–41)
Albumin: 3.4 g/dL — ABNORMAL LOW (ref 3.5–5.0)
Alkaline Phosphatase: 77 U/L (ref 38–126)
Anion gap: 10 (ref 5–15)
BUN: 6 mg/dL (ref 6–20)
CO2: 23 mmol/L (ref 22–32)
Calcium: 8.6 mg/dL — ABNORMAL LOW (ref 8.9–10.3)
Chloride: 104 mmol/L (ref 98–111)
Creatinine, Ser: 0.51 mg/dL — ABNORMAL LOW (ref 0.61–1.24)
GFR, Estimated: >60 mL/min (ref >60)
Glucose, Bld: 99 mg/dL (ref 70–99)
Potassium: 3.1 mmol/L — ABNORMAL LOW (ref 3.5–5.1)
Sodium: 137 mmol/L (ref 135–145)
Total Bilirubin: 1 mg/dL (ref 0.3–1.2)
Total Protein: 5.9 g/dL — ABNORMAL LOW (ref 6.5–8.1)

## 2021-03-30 LAB — URINE CULTURE
Culture: <10000 — AB
Special Requests: NORMAL

## 2021-03-30 LAB — CBC WITH DIFFERENTIAL/PLATELET
Abs Immature Granulocytes: 0.02 10*3/uL (ref 0.00–0.07)
Basophils Absolute: 0 10*3/uL (ref 0.0–0.1)
Basophils Relative: 1 %
Eosinophils Absolute: 0.1 10*3/uL (ref 0.0–0.5)
Eosinophils Relative: 1 %
HCT: 38.7 % — ABNORMAL LOW (ref 39.0–52.0)
Hemoglobin: 13 g/dL (ref 13.0–17.0)
Immature Granulocytes: 0 %
Lymphocytes Relative: 25 %
Lymphs Abs: 1.3 10*3/uL (ref 0.7–4.0)
MCH: 31.9 pg (ref 26.0–34.0)
MCHC: 33.6 g/dL (ref 30.0–36.0)
MCV: 94.9 fL (ref 80.0–100.0)
Monocytes Absolute: 0.5 10*3/uL (ref 0.1–1.0)
Monocytes Relative: 10 %
Neutro Abs: 3.3 10*3/uL (ref 1.7–7.7)
Neutrophils Relative %: 63 %
Platelets: 130 10*3/uL — ABNORMAL LOW (ref 150–400)
RBC: 4.08 MIL/uL — ABNORMAL LOW (ref 4.22–5.81)
RDW: 12.8 % (ref 11.5–15.5)
WBC: 5.2 10*3/uL (ref 4.0–10.5)
nRBC: 0 % (ref 0.0–0.2)

## 2021-03-30 LAB — MAGNESIUM: Magnesium: 1.6 mg/dL — ABNORMAL LOW (ref 1.7–2.4)

## 2021-03-30 LAB — POTASSIUM: Potassium: 3.9 mmol/L (ref 3.5–5.1)

## 2021-03-30 MED ORDER — MAGNESIUM SULFATE 4 GM/100ML IV SOLN
4.0000 g | Freq: Once | INTRAVENOUS | Status: AC
  Administered 2021-03-30: 4 g via INTRAVENOUS
  Filled 2021-03-30: qty 100

## 2021-03-30 MED ORDER — POTASSIUM CHLORIDE CRYS ER 20 MEQ PO TBCR
40.0000 meq | EXTENDED_RELEASE_TABLET | Freq: Once | ORAL | Status: AC
  Administered 2021-03-30: 40 meq via ORAL
  Filled 2021-03-30: qty 2

## 2021-03-30 MED ORDER — AMLODIPINE BESYLATE 10 MG PO TABS
10.0000 mg | ORAL_TABLET | Freq: Every day | ORAL | Status: DC
  Administered 2021-03-30 – 2021-04-02 (×4): 10 mg via ORAL
  Filled 2021-03-30 (×4): qty 1

## 2021-03-30 MED ORDER — HYDRALAZINE HCL 20 MG/ML IJ SOLN
10.0000 mg | INTRAMUSCULAR | Status: AC | PRN
  Administered 2021-03-30 (×2): 20 mg via INTRAVENOUS
  Filled 2021-03-30 (×2): qty 1

## 2021-03-30 MED ORDER — POTASSIUM CHLORIDE 10 MEQ/100ML IV SOLN
10.0000 meq | INTRAVENOUS | Status: DC
  Administered 2021-03-30: 10 meq via INTRAVENOUS
  Filled 2021-03-30 (×2): qty 100

## 2021-03-30 MED ORDER — POTASSIUM CHLORIDE CRYS ER 10 MEQ PO TBCR
20.0000 meq | EXTENDED_RELEASE_TABLET | ORAL | Status: AC
  Administered 2021-03-30 (×2): 20 meq via ORAL
  Filled 2021-03-30: qty 1
  Filled 2021-03-30: qty 2

## 2021-03-30 NOTE — Progress Notes (Signed)
NAME:  Charles Graves, MRN:  [id number], DOB:  [birth year], LOS: 2 ADMISSION DATE:  03/28/2021, CONSULTATION DATE:  03/28/2021 REFERRING MD:  Dr Wyvonnia Dusky, CHIEF COMPLAINT:  Alcohol withdrawal with Delirium tremens   History of Present Illness:  Patient brought in by EMS following a fall Quit drinking cold [place] 5 days prior to presentation He was having auditory and visual hallucinations [place] about 12 hours prior to presentation  Hypertensive, tachycardic  Denies any pain, no focal weakness on exam  History of HTN  Pertinent  Medical History  HTN  Significant Hospital Events: Including procedures, antibiotic start and stop dates in addition to other pertinent events   SAH on CT head 7/19-stable  Interim History / Subjective:  No overnight events Discontinued low-dose Precedex On Ativan  Objective   Blood pressure (!) 172/121, pulse 86, temperature 97.6 F (36.4 C), temperature source Oral, resp. rate 12, height 5\' 8"  (1.727 m), weight 56.6 kg, SpO2 99 %.        Intake/Output Summary (Last 24 hours) at 03/30/2021 0845 Last data filed at 03/30/2021 0800 Gross per 24 hour  Intake 3841.12 ml  Output 2450 ml  Net 1391.12 ml   Filed Weights   03/28/21 0722 03/28/21 1755 03/30/21 0438  Weight: 59.9 kg 54.2 kg 56.6 kg    Examination: General: Middle-aged, comfortable HENT: Moist oral mucosa, anicteric Lungs: Clear breath sounds bilaterally Cardiovascular: S1-S2 appreciated, no murmur Abdomen: Bowel sounds appreciated Extremities: No clubbing, no edema Neuro: Moving all extremities with no focal findings GU: fair output  Resolved Hospital Problem list     Assessment & Plan:   Delirium tremens -Improving symptoms -On oral Ativan  Alcohol withdrawal -Daily drink adequate 5 days prior to presentation  Subarachnoid hemorrhage secondary to fall -Discussed and reviewed CT with neuro surgery -Very small hemorrhage -Antiseizure medications, pneumonia TPN  discontinued -GCS of 15  History of hypertension -Required hydralazine -We will initiate amlodipine  Continue Ativan 1 mg p.o. twice daily Replete electrolytes -On potassium replacement  Continue MVI, continue thiamine, advance diet  Transfer to medical floor  Best Practice (right click and "Reselect all SmartList Selections" daily)   Diet/type: Regular consistency (see orders) DVT prophylaxis: SCD GI prophylaxis: PPI Lines: N/A Foley:  N/A Code Status:  full code Last date of multidisciplinary goals of care discussion [pending]  Labs   CBC: Recent Labs  Lab 03/28/21 0610 03/28/21 1702 03/29/21 0253 03/30/21 0547  WBC 9.1 6.2 5.3 5.2  NEUTROABS 7.3  --   --  3.3  HGB 16.1 13.9 13.2 13.0  HCT 45.4 40.1 39.0 38.7*  MCV 90.8 92.0 95.1 94.9  PLT 120* 104* 100* 130*    Basic Metabolic Panel: Recent Labs  Lab 03/28/21 0610 03/28/21 1702 03/29/21 0253 03/30/21 0547  NA 129*  --  133* 137  K 3.0*  --  3.4* 3.1*  CL 87*  --  102 104  CO2 23  --  22 23  GLUCOSE 142*  --  94 99  BUN 6  --  7 6  CREATININE 0.69 0.58* 0.42* 0.51*  CALCIUM 9.4  --  8.4* 8.6*  MG 1.5*  --   --  1.6*  PHOS 2.7  --   --   --    GFR: Estimated Creatinine Clearance: 97.3 mL/min (A) (by C-G formula based on SCr of 0.51 mg/dL (L)). Recent Labs  Lab 03/28/21 0610 03/28/21 1702 03/29/21 0253 03/30/21 0547  WBC 9.1 6.2 5.3 5.2    Liver Function  Tests: Recent Labs  Lab 03/28/21 0610 03/30/21 0547  AST 50* 66*  ALT 42 45*  ALKPHOS 94 77  BILITOT 2.8* 1.0  PROT 7.8 5.9*  ALBUMIN 4.5 3.4*   No results for input(s): LIPASE, AMYLASE in the last 168 hours. No results for input(s): AMMONIA in the last 168 hours.  ABG No results found for: PHART, PCO2ART, PO2ART, HCO3, TCO2, ACIDBASEDEF, O2SAT   Coagulation Profile: No results for input(s): INR, PROTIME in the last 168 hours.  Cardiac Enzymes: No results for input(s): CKTOTAL, CKMB, CKMBINDEX, TROPONINI in the last 168  hours.  HbA1C: No results found for: HGBA1C  CBG: No results for input(s): GLUCAP in the last 168 hours.  Review of Systems:   Denies HA, no pain or discomfort  Past Medical History:  He,  has no past medical history on file.   Surgical History:  History reviewed. No pertinent surgical history.   Social History:   reports that he has quit smoking. His smoking use included cigarettes. He has never used smokeless tobacco. He reports current alcohol use. He reports previous drug use.   Family History:  His family history is not on file.   Allergies Allergies  Allergen Reactions   Oxycodone Hcl Swelling     Home Medications  Prior to Admission medications   Not on File    Sherrilyn Rist, MD [place] [facility] Pager: See Shea Evans

## 2021-03-30 NOTE — Progress Notes (Signed)
[facility] ADULT ICU REPLACEMENT PROTOCOL   The patient does apply for the [facility] Adult ICU Electrolyte Replacment Protocol based on the criteria listed below:   1.Exclusion criteria: TCTS patients, ECMO patients and Hypothermia Protocol, and   Dialysis patients 2. Is GFR >/= 30 ml/min? Yes.    Patient's GFR today is >60 3. Is SCr </= 2? Yes.   Patient's SCr is 0.51 mg/dL 4. Did SCr increase >/= 0.5 in 24 hours? No. 5.Pt's weight >40kg  Yes.   6. Abnormal electrolyte(s): K= 3.1, mag 1.6  7. Electrolytes replaced per protocol 8.  Call MD STAT for K+ </= 2.5, Phos </= 1, or Mag </= 1 Physician:  n/a  Charles Graves 03/30/2021 6:35 AM

## 2021-03-30 NOTE — TOC Initial Note (Signed)
Transition of Care [facility]) - Initial/Assessment Note    Patient Details  Name: Charles Graves MRN: [id number] Date of Birth: [birth year]  [facility] CM/SW Contact:    Leeroy Cha, RN Phone Number: 03/30/2021, 8:49 AM  Clinical Narrative:                 Patient brought in by EMS following a fall [place]irt drinking cold [place] 5 days ago Having auditory and visual hallucinations [place] about 12 hours prior to presentation   Hypertensive, tachycardic   Denies any pain, no focal weakness on exam   History of HTN   Pertinent  Medical History  HTN   Significant Hospital Events: Including procedures, antibiotic start and stop dates in addition to other pertinent events  SAH on CT head 7/19-stable TOC PLAN OF CARE: following for progression and toc needs.  Patient is from [employer]. no insurance.   Expected Discharge Plan: Home/Self Care Barriers to Discharge: Continued Medical Work up   Patient Goals and CMS Choice        Expected Discharge Plan and Services Expected Discharge Plan: Home/Self Care   Discharge Planning Services: CM Consult                                          Prior Living Arrangements/Services                       Activities of Daily Living Home Assistive Devices/Equipment: Eyeglasses ADL Screening (condition at time of admission) Patient's cognitive ability adequate to safely complete daily activities?: No Is the patient deaf or have difficulty hearing?: No Does the patient have difficulty seeing, even when wearing glasses/contacts?: No Does the patient have difficulty concentrating, remembering, or making decisions?: Yes Patient able to express need for assistance with ADLs?: Yes Does the patient have difficulty dressing or bathing?: No Independently performs ADLs?: Yes (appropriate for developmental age) Does the patient have difficulty walking or climbing stairs?: Yes Weakness of Legs: Both Weakness of  Arms/Hands: Both  Permission Sought/Granted                  Emotional Assessment Appearance:: Appears stated age     Orientation: : Fluctuating Orientation (Suspected and/or reported Sundowners) Alcohol / Substance Use: Alcohol Use, Illicit Drugs Psych Involvement: No (comment)  Admission diagnosis:  Subarachnoid hemorrhage ([place]) [I60.9] Delirium tremens ([place]) [F10.231] Closed head injury, initial encounter [S09.90XA] Fall in home, initial encounter [W19.Merril Abbe, E72.094] Patient Active Problem List   Diagnosis Date Noted   Protein-calorie malnutrition, severe 03/29/2021   Delirium tremens ([place]) 03/28/2021   Subarachnoid hemorrhage ([place]) 03/28/2021   PCP:  Pcp, No Pharmacy:   [id number] - [address], [place] [address] Phone: [phone] Fax: [phone]     Social Determinants of Health (SDOH) Interventions    Readmission Risk Interventions No flowsheet data found.

## 2021-03-30 NOTE — Progress Notes (Signed)
Pt received on the unit from ICU. Patient was oriented to the unit, as well as the phone and call bell. Pt appears comfortable at this time.

## 2021-03-30 NOTE — Progress Notes (Signed)
Patient complaining that left arm was hurting due to potassium infusion. Patient already received 20 meq PO. MD Olalere ordered to discontinue IV piggy back potassium order and order 40 meq PO once. Patient's K on morning BMET was 3.1.

## 2021-03-30 NOTE — Progress Notes (Signed)
[facility] Progress Note Patient Name: Charles Graves DOB: [birth year] MRN: [id number]   Date of Service  03/30/2021  HPI/Events of Note  Patient with sub-optimal blood pressure control.  eICU Interventions  PRN Hydralazine ordered.        [name] [name] 03/30/2021, 4:10 AM

## 2021-03-31 DIAGNOSIS — I609 Nontraumatic subarachnoid hemorrhage, unspecified: Secondary | ICD-10-CM

## 2021-03-31 DIAGNOSIS — E43 Unspecified severe protein-calorie malnutrition: Secondary | ICD-10-CM

## 2021-03-31 MED ORDER — LORAZEPAM 1 MG PO TABS
1.0000 mg | ORAL_TABLET | ORAL | Status: DC | PRN
  Administered 2021-04-01: 1 mg via ORAL
  Filled 2021-03-31: qty 1

## 2021-03-31 MED ORDER — ACETAMINOPHEN 325 MG PO TABS
650.0000 mg | ORAL_TABLET | Freq: Four times a day (QID) | ORAL | Status: DC | PRN
  Administered 2021-03-31 – 2021-04-01 (×2): 650 mg via ORAL
  Filled 2021-03-31: qty 2

## 2021-03-31 NOTE — Progress Notes (Signed)
PROGRESS NOTE    Charles Graves  KDX:[id number] DOB: September 03, 1981 DOA: 03/28/2021 PCP: Pcp, No    Brief Narrative:  Charles Graves was admitted to the hospital with alcohol withdrawal syndrome.   40 year old male who was brought to the hospital by EMS after experiencing a fall.  Apparently patient stopped drinking alcohol about 5 days ago he has been experiencing auditory and visual hallucinations for about 12 hours prior to presentation.  On his initial physical examination his blood pressure was 128/86, heart rate 126, temperature 98, respiratory rate 24, oxygen saturation 99%.  She was awake and alert moving all 4 extremities and following commands, lungs clear to auscultation bilaterally, heart S1-S2, present, rhythmic, soft abdomen, no lower extremity edema.  Sodium 129, potassium 3.0, chloride 87, bicarb 23, glucose 142, BUN 6, creatinine 0.69, magnesium 1.5, white count 9.1, hemoglobin 16.1, hematocrit 45.4, platelets 120. SARS COVID negative.  Urinalysis specific gravity > 1.046, 100 protein, negative nitrates. Alcohol level less than 10.  CT head with small focus of subarachnoid hemorrhage in the right parietal region.  Moderate sized left posterior scalp hematoma. 8 mm vascular prominence seen in the region of the right middle cerebral artery.   Multilevel degenerative disc disease cervical spine. CT thoracic spine no acute fracture or traumatic malalignment.  Multilevel degenerative disc disease.  EKG 118 bpm, normal axis, normal intervals, sinus rhythm, no ST segment or T wave changes.  Patient was admitted to the intensive care unit, he was placed on dexmedetomidine for his acute withdrawal syndrome.  CT angiography negative for aneurysmal dilatation.  Placed on seizure prophylaxis, and nimodipine for blood pressure control. Neurosurgery recommended continue conservative medical therapy.  Transferred to [facility] on 07/21   Assessment & Plan:   Active Problems:   Delirium tremens  (HCC)   Subarachnoid hemorrhage (HCC)   Protein-calorie malnutrition, severe   Severe alcohol withdrawal with delirium tremens. Patient is more calm. No tremors, tolerating po well, no nausea or vomiting.   Continue as needed lorazepam and multivitamins.  2. Subarachnoid hemorrhage. Clinically stable, mild headache this am that improved with acetaminophen.   No further inpatient workup per neurosurgery.  Need to check with neurosurgery if still need seizure prophylaxis.   3. Severe protein calorie malnutrition. Continue with nutritional supplements.   4. HTN. Continue blood pressure control with amlodipine.   5. Hypokalemia and hypomagnesemia., renal function stable with serum cr at 0,51, K is 3,8 and serum Mg at 1,6 labs from yesterday. Will check electrolytes in am.   Status is: Inpatient  Remains inpatient appropriate because:Inpatient level of care appropriate due to severity of illness  Dispo: The patient is from: Home              Anticipated d/c is to: Home              Patient currently is not medically stable to d/c.   Difficult to place patient No   DVT prophylaxis: Scd   Code Status:    full  Family Communication:  No family at the bedside      Nutrition Status: Nutrition Problem: Severe Malnutrition Etiology: social / environmental circumstances (alcohol abuse) Signs/Symptoms: moderate fat depletion, moderate muscle depletion, severe fat depletion, severe muscle depletion Interventions: Ensure Enlive (each supplement provides 350kcal and 20 grams of protein), Prostat, MVI      Consultants:  Neurosurgery    Subjective: Patient is calm and cooperative, no nausea or vomiting, no chest pain or dyspnea   Objective: Vitals:  03/31/21 0415 03/31/21 0444 03/31/21 0500 03/31/21 1325  BP: (!) 148/104 (!) 143/99  139/86  Pulse: 79 82  83  Resp: 15 16  20   Temp: 98.4 F (36.9 C)   (!) 97.5 F (36.4 C)  TempSrc:    Oral  SpO2: 100% 100%  100%  Weight:    57.6 kg   Height:        Intake/Output Summary (Last 24 hours) at 03/31/2021 1720 Last data filed at 03/31/2021 1503 Gross per 24 hour  Intake 708 ml  Output --  Net 708 ml   Filed Weights   03/28/21 1755 03/30/21 0438 03/31/21 0500  Weight: 54.2 kg 56.6 kg 57.6 kg    Examination:   General: Not in pain or dyspnea, deconditioned  Neurology: Awake and alert, non focal, no tremors or anxiety.  E ENT: no pallor, no icterus, oral mucosa moist Cardiovascular: No JVD. S1-S2 present, rhythmic, no gallops, rubs, or murmurs. No lower extremity edema. Pulmonary:  positive breath sounds bilaterally, adequate air movement, no wheezing, rhonchi or rales. Gastrointestinal. Abdomen soft and non tender Skin. No rashes Musculoskeletal: no joint deformities     Data Reviewed: I have personally reviewed following labs and imaging studies  CBC: Recent Labs  Lab 03/28/21 0610 03/28/21 1702 03/29/21 0253 03/30/21 0547  WBC 9.1 6.2 5.3 5.2  NEUTROABS 7.3  --   --  3.3  HGB 16.1 13.9 13.2 13.0  HCT 45.4 40.1 39.0 38.7*  MCV 90.8 92.0 95.1 94.9  PLT 120* 104* 100* 259*   Basic Metabolic Panel: Recent Labs  Lab 03/28/21 0610 03/28/21 1702 03/29/21 0253 03/30/21 0547 03/30/21 1616  NA 129*  --  133* 137  --   K 3.0*  --  3.4* 3.1* 3.9  CL 87*  --  102 104  --   CO2 23  --  22 23  --   GLUCOSE 142*  --  94 99  --   BUN 6  --  7 6  --   CREATININE 0.69 0.58* 0.42* 0.51*  --   CALCIUM 9.4  --  8.4* 8.6*  --   MG 1.5*  --   --  1.6*  --   PHOS 2.7  --   --   --   --    GFR: Estimated Creatinine Clearance: 99 mL/min (A) (by C-G formula based on SCr of 0.51 mg/dL (L)). Liver Function Tests: Recent Labs  Lab 03/28/21 0610 03/30/21 0547  AST 50* 66*  ALT 42 45*  ALKPHOS 94 77  BILITOT 2.8* 1.0  PROT 7.8 5.9*  ALBUMIN 4.5 3.4*   No results for input(s): LIPASE, AMYLASE in the last 168 hours. No results for input(s): AMMONIA in the last 168 hours. Coagulation Profile: No  results for input(s): INR, PROTIME in the last 168 hours. Cardiac Enzymes: No results for input(s): CKTOTAL, CKMB, CKMBINDEX, TROPONINI in the last 168 hours. BNP (last 3 results) No results for input(s): PROBNP in the last 8760 hours. HbA1C: No results for input(s): HGBA1C in the last 72 hours. CBG: No results for input(s): GLUCAP in the last 168 hours. Lipid Profile: No results for input(s): CHOL, HDL, LDLCALC, TRIG, CHOLHDL, LDLDIRECT in the last 72 hours. Thyroid Function Tests: No results for input(s): TSH, T4TOTAL, FREET4, T3FREE, THYROIDAB in the last 72 hours. Anemia Panel: No results for input(s): VITAMINB12, FOLATE, FERRITIN, TIBC, IRON, RETICCTPCT in the last 72 hours.    Radiology Studies: I have reviewed all of the imaging  during this hospital visit personally     Scheduled Meds:  (feeding supplement) PROSource Plus  30 mL Oral Daily   amLODipine  10 mg Oral Daily   Chlorhexidine Gluconate Cloth  6 each Topical Daily   feeding supplement  237 mL Oral BID BM   folic acid  1 mg Oral Daily   mouth rinse  15 mL Mouth Rinse BID   multivitamin with minerals  1 tablet Oral Daily   pantoprazole  40 mg Oral Daily   pneumococcal 23 valent vaccine  0.5 mL Intramuscular Tomorrow-1000   thiamine  100 mg Oral Daily   Or   thiamine  100 mg Intravenous Daily   Continuous Infusions:   LOS: 3 days        Elia Nunley Gerome Apley, MD

## 2021-04-01 DIAGNOSIS — F10231 Alcohol dependence with withdrawal delirium: Secondary | ICD-10-CM

## 2021-04-01 LAB — BASIC METABOLIC PANEL
Anion gap: 7 (ref 5–15)
BUN: 10 mg/dL (ref 6–20)
CO2: 27 mmol/L (ref 22–32)
Calcium: 9.7 mg/dL (ref 8.9–10.3)
Chloride: 101 mmol/L (ref 98–111)
Creatinine, Ser: 0.63 mg/dL (ref 0.61–1.24)
GFR, Estimated: >60 mL/min (ref >60)
Glucose, Bld: 103 mg/dL — ABNORMAL HIGH (ref 70–99)
Potassium: 4.6 mmol/L (ref 3.5–5.1)
Sodium: 135 mmol/L (ref 135–145)

## 2021-04-01 LAB — MAGNESIUM: Magnesium: 1.9 mg/dL (ref 1.7–2.4)

## 2021-04-01 NOTE — Progress Notes (Signed)
PROGRESS NOTE    Charles Graves  [id number] DOB: 19-Jan-1981 DOA: 03/28/2021 PCP: Pcp, No   Brief Narrative:  This 40 years old male with PMH significant for alcohol abuse brought to the hospital by EMS after experiencing a fall.  Apparently patient stopped drinking alcohol about 5 days ago, he has been experiencing auditory and visual hallucination for about 12 hours prior to the presentation.  He was found to have hyponatremia sodium 129 , hypokalemia potassium 3.0.  CT head with small focus of subarachnoid hemorrhage in the right parietal region,  moderate size left posterior scalp hematoma.  CT thoracic spine no acute fracture or traumatic malalignment. Patient was initially admitted in the ICU,  he was placed on Precedex drip for his acute withdrawal syndrome.  CTA head and neck negative for aneurysmal dilatation.  Patient placed on seizure prophylaxis and nimodipine  for blood pressure control.  Neurosurgery recommended conservative medical therapy.  Assessment & Plan:   Active Problems:   Delirium tremens (HCC)   Subarachnoid hemorrhage (HCC)   Protein-calorie malnutrition, severe   Severe alcohol withdrawal with delirium tremens.  Patient presented with severe delirium tremens.  Initially admitted in ICU. Patient is much calmer now, tremors improved.  Patient is tolerating p.o. He denies any nausea vomiting or abdominal pain. Continue as needed lorazepam and multivitamins.   Subarachnoid hemorrhage.  Clinically stable, He reports mild headache this am that improved with acetaminophen.   No further inpatient workup per neurosurgery. Need to check with neurosurgery if still need seizure prophylaxis.   Severe protein calorie malnutrition. Continue with nutritional supplements.   HTN. Continue blood pressure control with amlodipine.   Hypokalemia and hypomagnesemia.: Replaced, continue to monitor. Renal function back to normal, electrolytes improved.   DVT prophylaxis:  SCDs Code Status: Full code. Family Communication: No family at bed side. Disposition Plan:   Status is: Inpatient  Remains inpatient appropriate because:Inpatient level of care appropriate due to severity of illness  Dispo: The patient is from: Home              Anticipated d/c is to: Home              Patient currently is not medically stable to d/c.   Difficult to place patient No  Consultants:  PCCM Neurosurgery  Procedures:  None Antimicrobials:  None  Subjective: Patient was seen and examined at bedside.  Overnight events noted.   Patient reports feeling improved.  he was brushing his teeth,  denies any cough and shortness of breath.  Objective: Vitals:   04/01/21 0553 04/01/21 0603 04/01/21 1034 04/01/21 1412  BP:  (!) 154/116 (!) 133/102 124/89  Pulse: 82 86  83  Resp:  (!) 22  18  Temp:  98.8 F (37.1 C)  98.7 F (37.1 C)  TempSrc:  Oral  Oral  SpO2: 100%   100%  Weight:      Height:        Intake/Output Summary (Last 24 hours) at 04/01/2021 1428 Last data filed at 04/01/2021 0900 Gross per 24 hour  Intake 712 ml  Output --  Net 712 ml   Filed Weights   03/30/21 0438 03/31/21 0500 04/01/21 0500  Weight: 56.6 kg 57.6 kg 58.9 kg    Examination:  General exam: Appears calm and comfortable, not in any acute distress. Respiratory system: Clear to auscultation. Respiratory effort normal. Cardiovascular system: S1 & S2 heard, RRR. No JVD, murmurs, rubs, gallops or clicks. No pedal edema. Gastrointestinal system: Abdomen  is nondistended, soft and nontender. No organomegaly or masses felt. Normal bowel sounds heard. Central nervous system: Alert and oriented. No focal neurological deficits. Extremities: Symmetric 5 x 5 power. No edema, no cyanosis, no clubbing. Skin: No rashes, lesions or ulcers Psychiatry: Judgement and insight appear normal. Mood & affect appropriate.     Data Reviewed: I have personally reviewed following labs and imaging  studies  CBC: Recent Labs  Lab 03/28/21 0610 03/28/21 1702 03/29/21 0253 03/30/21 0547  WBC 9.1 6.2 5.3 5.2  NEUTROABS 7.3  --   --  3.3  HGB 16.1 13.9 13.2 13.0  HCT 45.4 40.1 39.0 38.7*  MCV 90.8 92.0 95.1 94.9  PLT 120* 104* 100* [id number]   Basic Metabolic Panel: Recent Labs  Lab 03/28/21 0610 03/28/21 1702 03/29/21 0253 03/30/21 0547 03/30/21 1616 04/01/21 0441  NA 129*  --  133* 137  --  135  K 3.0*  --  3.4* 3.1* 3.9 4.6  CL 87*  --  102 104  --  101  CO2 23  --  22 23  --  27  GLUCOSE 142*  --  94 99  --  103*  BUN 6  --  7 6  --  10  CREATININE 0.69 0.58* 0.42* 0.51*  --  0.63  CALCIUM 9.4  --  8.4* 8.6*  --  9.7  MG 1.5*  --   --  1.6*  --  1.9  PHOS 2.7  --   --   --   --   --    GFR: Estimated Creatinine Clearance: 100.2 mL/min (by C-G formula based on SCr of 0.63 mg/dL). Liver Function Tests: Recent Labs  Lab 03/28/21 0610 03/30/21 0547  AST 50* 66*  ALT 42 45*  ALKPHOS 94 77  BILITOT 2.8* 1.0  PROT 7.8 5.9*  ALBUMIN 4.5 3.4*   No results for input(s): LIPASE, AMYLASE in the last 168 hours. No results for input(s): AMMONIA in the last 168 hours. Coagulation Profile: No results for input(s): INR, PROTIME in the last 168 hours. Cardiac Enzymes: No results for input(s): CKTOTAL, CKMB, CKMBINDEX, TROPONINI in the last 168 hours. BNP (last 3 results) No results for input(s): PROBNP in the last 8760 hours. HbA1C: No results for input(s): HGBA1C in the last 72 hours. CBG: No results for input(s): GLUCAP in the last 168 hours. Lipid Profile: No results for input(s): CHOL, HDL, LDLCALC, TRIG, CHOLHDL, LDLDIRECT in the last 72 hours. Thyroid Function Tests: No results for input(s): TSH, T4TOTAL, FREET4, T3FREE, THYROIDAB in the last 72 hours. Anemia Panel: No results for input(s): VITAMINB12, FOLATE, FERRITIN, TIBC, IRON, RETICCTPCT in the last 72 hours. Sepsis Labs: No results for input(s): PROCALCITON, LATICACIDVEN in the last 168 hours.  Recent  Results (from the past 240 hour(s))  MRSA Next Gen by PCR, Nasal     Status: None   Collection Time: 03/28/21  5:47 PM   Specimen: Nasal Mucosa; Nasal Swab  Result Value Ref Range Status   MRSA by PCR Next Gen NOT DETECTED NOT DETECTED Final    Comment: (NOTE) The GeneXpert MRSA Assay (FDA approved for NASAL specimens only), is one component of a comprehensive MRSA colonization surveillance program. It is not intended to diagnose MRSA infection nor to guide or monitor treatment for MRSA infections. Test performance is not FDA approved in patients less than 13 years old. Performed at [facility], [place] [address] [place], [place] [address]   Urine Culture  Status: Abnormal   Collection Time: 03/28/21  6:57 PM   Specimen: Urine, Clean Catch  Result Value Ref Range Status   Specimen Description   Final    URINE, CLEAN CATCH Performed at [facility], [place] [address] [place], [place] [address]    Special Requests   Final    Normal Performed at [facility], [place] [address], [place], [place] [address]    Culture (A)  Final    <10,000 COLONIES/mL INSIGNIFICANT GROWTH Performed at [place] [address] [place], [place] [address]    Report Status 03/30/2021 FINAL  Final  SARS CORONAVIRUS 2 (TAT 6-24 HRS) Nasopharyngeal Nasopharyngeal Swab     Status: None   Collection Time: 03/28/21  9:14 PM   Specimen: Nasopharyngeal Swab  Result Value Ref Range Status   SARS Coronavirus 2 NEGATIVE NEGATIVE Final    Comment: (NOTE) SARS-CoV-2 target nucleic acids are NOT DETECTED.  The SARS-CoV-2 RNA is generally detectable in upper and lower respiratory specimens during the acute phase of infection. Negative results do not preclude SARS-CoV-2 infection, do not rule out co-infections with other pathogens, and should not be used as the sole basis for treatment or other patient management decisions. Negative results must be  combined with clinical observations, patient history, and epidemiological information. The expected result is Negative.  Fact Sheet for Patients: SugarRoll.be  Fact Sheet for Healthcare Providers: https://www.woods-mathews.com/  This test is not yet approved or cleared by the [place] FDA and  has been authorized for detection and/or diagnosis of SARS-CoV-2 by FDA under an Emergency Use Authorization (EUA). This EUA will remain  in effect (meaning this test can be used) for the duration of the COVID-19 declaration under Se ction 564(b)(1) of the Act, 21 U.S.C. section 360bbb-3(b)(1), unless the authorization is terminated or revoked sooner.  Performed at [facility], [place] [address] [place], [place] [address]   C Difficile Quick Screen w PCR reflex     Status: None   Collection Time: 03/29/21  7:00 PM   Specimen: STOOL  Result Value Ref Range Status   C Diff antigen NEGATIVE NEGATIVE Final   C Diff toxin NEGATIVE NEGATIVE Final   C Diff interpretation No C. difficile detected.  Final    Comment: Performed at [place], [place] [address] [place], [place] [address]    Radiology Studies: No results found.  Scheduled Meds:  (feeding supplement) PROSource Plus  30 mL Oral Daily   amLODipine  10 mg Oral Daily   feeding supplement  237 mL Oral BID BM   folic acid  1 mg Oral Daily   mouth rinse  15 mL Mouth Rinse BID   multivitamin with minerals  1 tablet Oral Daily   pantoprazole  40 mg Oral Daily   pneumococcal 23 valent vaccine  0.5 mL Intramuscular Tomorrow-1000   thiamine  100 mg Oral Daily   Continuous Infusions:   LOS: 4 days    Time spent: 25 mins    Shawna Clamp, MD [facility]   If 7PM-7AM, please contact night-coverage

## 2021-04-02 LAB — MAGNESIUM: Magnesium: 2 mg/dL (ref 1.7–2.4)

## 2021-04-02 LAB — BASIC METABOLIC PANEL
Anion gap: 10 (ref 5–15)
BUN: 16 mg/dL (ref 6–20)
CO2: 29 mmol/L (ref 22–32)
Calcium: 9.9 mg/dL (ref 8.9–10.3)
Chloride: 99 mmol/L (ref 98–111)
Creatinine, Ser: 0.64 mg/dL (ref 0.61–1.24)
GFR, Estimated: >60 mL/min (ref >60)
Glucose, Bld: 91 mg/dL (ref 70–99)
Potassium: 4.3 mmol/L (ref 3.5–5.1)
Sodium: 138 mmol/L (ref 135–145)

## 2021-04-02 LAB — PHOSPHORUS: Phosphorus: 4.9 mg/dL — ABNORMAL HIGH (ref 2.5–4.6)

## 2021-04-02 MED ORDER — PANTOPRAZOLE SODIUM 40 MG PO TBEC: 40.0000 mg | DELAYED_RELEASE_TABLET | Freq: Every day | ORAL | 0 refills | Status: DC

## 2021-04-02 MED ORDER — HYDRALAZINE HCL 25 MG PO TABS: 25.0000 mg | ORAL_TABLET | Freq: Three times a day (TID) | ORAL | 1 refills | Status: DC

## 2021-04-02 MED ORDER — AMLODIPINE BESYLATE 10 MG PO TABS: 10.0000 mg | ORAL_TABLET | Freq: Every day | ORAL | 1 refills | Status: DC

## 2021-04-02 MED ORDER — HYDRALAZINE HCL 25 MG PO TABS
25.0000 mg | ORAL_TABLET | Freq: Three times a day (TID) | ORAL | Status: DC
  Administered 2021-04-02: 25 mg via ORAL
  Filled 2021-04-02: qty 1

## 2021-04-02 NOTE — Discharge Instructions (Signed)
Advised to follow-up with primary care physician in 1 week. Advised to take amlodipine 10 mg and hydralazine 25 mg 3 times daily for blood pressure. Will get PCP to adjust his blood pressure medications as needed.

## 2021-04-02 NOTE — Progress Notes (Signed)
Patient Discharged to home through safe transport. Education about medications provided.

## 2021-04-02 NOTE — Discharge Summary (Addendum)
Physician Discharge Summary  Charles Graves [id number] DOB: 1981/01/19 DOA: 03/28/2021  PCP: Pcp, No  Admit date: 03/28/2021  Discharge date: 04/02/2021  Admitted From: Home. Disposition:  Home.  Recommendations for Outpatient Follow-up:  Follow up with PCP in 1-2 weeks. Please obtain BMP/CBC in one week Advised to take amlodipine 10 mg daily and hydralazine 25 mg 3 times daily for HTN. Will request PCP to adjust his blood pressure medications as needed. Advised to follow-up with neurosurgery as scheduled.  Home Health: None Equipment/Devices: None  Discharge Condition: Good CODE STATUS:Full code Diet recommendation: Heart Healthy   Brief [facility] Course: This 40 years old male with PMH significant for alcohol abuse brought to the hospital by EMS after experiencing a fall.  Apparently patient stopped drinking alcohol about 5 days ago, he has been experiencing auditory and visual hallucination for about 12 hours prior to the presentation.  He was found to have hyponatremia (sodium 129) , hypokalemia (potassium 3.0). CT head with small focus of subarachnoid hemorrhage in the right parietal region,  moderate size left posterior scalp hematoma. CT thoracic spine no acute fracture or traumatic malalignment. Patient was initially admitted in the ICU,  he was placed on Precedex drip for his acute withdrawal syndrome. CTA head and neck negative for aneurysmal dilatation. Patient placed on seizure prophylaxis and nimodipine for blood pressure control.  Neurosurgery recommended conservative medical therapy.  Patient was transferred to the medical floor.  Patient continued to have elevated blood pressure.  Patient is started on amlodipine 10 mg daily and later hydralazine 25 mg 3 times daily was added.  Delirium tremens has resolved.  Patient has been tolerating p.o. medications.  Electrolyte deraingement has improved.  Patient has ambulated in the hallway without any problems.  Patient feels  better and wants to be discharged.  Patient is being discharged home.  He was managed for below problems.  Discharge Diagnoses:  Active Problems:   Delirium tremens (HCC)   Subarachnoid hemorrhage (HCC)   Protein-calorie malnutrition, severe  Severe alcohol withdrawal with delirium tremens.  Patient presented with severe delirium tremens.  Initially admitted in ICU. Patient is much calmer now, tremors improved.  Patient is tolerating p.o. He denies any nausea, vomiting or abdominal pain. Continue as needed lorazepam and multivitamins.   Subarachnoid hemorrhage. Clinically stable, He reports mild headache this am that improved with acetaminophen.   No further inpatient workup per neurosurgery. Needs outpatient neurosurgical follow-up.   Severe protein calorie malnutrition. Continue with nutritional supplements.   HTN. Continue blood pressure control with amlodipine. Hydralazine 25 mg 3 times daily was added for better blood pressure control.   Hypokalemia and hypomagnesemia.: Replaced, continue to monitor. Renal function back to normal, electrolytes improved.   Discharge Instructions  Discharge Instructions     Call MD for:  difficulty breathing, headache or visual disturbances   Complete by: As directed    Call MD for:  persistant dizziness or light-headedness   Complete by: As directed    Call MD for:  persistant nausea and vomiting   Complete by: As directed    Diet - low sodium heart healthy   Complete by: As directed    Diet Carb Modified   Complete by: As directed    Discharge instructions   Complete by: As directed    Advised to follow-up with primary care physician in 1 week. Advised to take amlodipine 10 mg and hydralazine 25 mg 3 times daily for blood pressure. Will get PCP to adjust his  blood pressure medications as needed.   Increase activity slowly   Complete by: As directed       Allergies as of 04/02/2021       Reactions   Oxycodone Hcl Swelling         Medication List     TAKE these medications    acetaminophen 500 MG tablet Commonly known as: TYLENOL Take 1,000 mg by mouth every 6 (six) hours as needed for mild pain.   amLODipine 10 MG tablet Commonly known as: NORVASC Take 1 tablet (10 mg total) by mouth daily. Start taking on: April 03, 2021   hydrALAZINE 25 MG tablet Commonly known as: APRESOLINE Take 1 tablet (25 mg total) by mouth every 8 (eight) hours.   pantoprazole 40 MG tablet Commonly known as: PROTONIX Take 1 tablet (40 mg total) by mouth daily. Start taking on: April 03, 2021        Follow-up Information     Primary [facility] at [facility] Follow up.   Specialty: Family Medicine Why: One of your options for a local primary care clinic Contact information: [address] [place] Follow up.   Specialty: Internal Medicine Why: The other option Contact information: [place] [id number] [place], [place] [facility] Follow up in 1 week(s).   Specialty: Neurosurgery Contact information: [address] [place]  [address] [phone]                Allergies  Allergen Reactions   Oxycodone Hcl Swelling    Consultations: PCCM   Procedures/Studies: CT ANGIO HEAD W OR WO CONTRAST  Result Date: 03/28/2021 CLINICAL DATA:  Fall with subarachnoid hemorrhage in suspicion of right MCA aneurysm. EXAM: CT ANGIOGRAPHY HEAD TECHNIQUE: Multidetector CT imaging of the head was performed using the standard protocol during bolus administration of intravenous contrast. Multiplanar CT image reconstructions and MIPs were obtained to evaluate the vascular anatomy. CONTRAST:  137m OMNIPAQUE IOHEXOL 350 MG/ML SOLN COMPARISON:  Head CT earlier same day. FINDINGS: CTA HEAD Anterior circulation: Both internal carotid arteries are  widely patent through the skull base and siphon regions. The anterior and middle cerebral vessels are patent without proximal stenosis, aneurysm or vascular malformation. Specifically, no MCA aneurysm on the right. Posterior circulation: Both vertebral arteries widely patent to the basilar. No basilar stenosis. Posterior circulation branch vessels are normal. Venous sinuses: Patent and normal. Anatomic variants: None significant. Review of the MIP images confirms the above findings. IMPRESSION: Normal intracranial CT angiography. No aneurysm. Density questioned at CT was artifactual. No intracranial atherosclerotic disease.  No stenosis or occlusion. Electronically Signed   By: MNelson ChimesM.D.   On: 03/28/2021 12:56   CT Head Wo Contrast  Result Date: 03/28/2021 CLINICAL DATA:  Head injury after fall. EXAM: CT HEAD WITHOUT CONTRAST CT CERVICAL SPINE WITHOUT CONTRAST TECHNIQUE: Multidetector CT imaging of the head and cervical spine was performed following the standard protocol without intravenous contrast. Multiplanar CT image reconstructions of the cervical spine were also generated. COMPARISON:  None. FINDINGS: CT HEAD FINDINGS Brain: There is a small focus of subarachnoid hemorrhage seen in right parietal region. Ventricular size is within normal limits. No mass effect or midline shift is noted. No acute infarction or mass lesion is noted. Vascular: 8 mm vascular prominence is seen in the  region of the right middle cerebral artery concerning for possible aneurysm. Skull: Normal. Negative for fracture or focal lesion. Sinuses/Orbits: No acute finding. Other: Left posterior scalp hematoma is noted. CT CERVICAL SPINE FINDINGS Alignment: Normal. Skull base and vertebrae: No acute fracture. No primary bone lesion or focal pathologic process. Soft tissues and spinal canal: No prevertebral fluid or swelling. No visible canal hematoma. Disc levels: Mild degenerative disc disease is noted at C4-5, C5-6 and C6-7.  Upper chest: Negative. Other: None. IMPRESSION: Small focus of subarachnoid hemorrhage is seen in right parietal region. Moderate size left posterior scalp hematoma is noted. 8 mm vascular prominence is seen in the region of the right middle cerebral artery; further evaluation with CTA or MRA is recommended to rule out aneurysm. Mild multilevel degenerative disc disease is noted in the cervical spine. No definite fracture is noted. Critical Value/emergent results were called by telephone at the time of interpretation on 03/28/2021 at 7:52 am to provider Dr. Tamera Punt, who verbally acknowledged these results. Electronically Signed   By: Marijo Conception M.D.   On: 03/28/2021 07:52   CT Cervical Spine Wo Contrast  Result Date: 03/28/2021 CLINICAL DATA:  Head injury after fall. EXAM: CT HEAD WITHOUT CONTRAST CT CERVICAL SPINE WITHOUT CONTRAST TECHNIQUE: Multidetector CT imaging of the head and cervical spine was performed following the standard protocol without intravenous contrast. Multiplanar CT image reconstructions of the cervical spine were also generated. COMPARISON:  None. FINDINGS: CT HEAD FINDINGS Brain: There is a small focus of subarachnoid hemorrhage seen in right parietal region. Ventricular size is within normal limits. No mass effect or midline shift is noted. No acute infarction or mass lesion is noted. Vascular: 8 mm vascular prominence is seen in the region of the right middle cerebral artery concerning for possible aneurysm. Skull: Normal. Negative for fracture or focal lesion. Sinuses/Orbits: No acute finding. Other: Left posterior scalp hematoma is noted. CT CERVICAL SPINE FINDINGS Alignment: Normal. Skull base and vertebrae: No acute fracture. No primary bone lesion or focal pathologic process. Soft tissues and spinal canal: No prevertebral fluid or swelling. No visible canal hematoma. Disc levels: Mild degenerative disc disease is noted at C4-5, C5-6 and C6-7. Upper chest: Negative. Other: None.  IMPRESSION: Small focus of subarachnoid hemorrhage is seen in right parietal region. Moderate size left posterior scalp hematoma is noted. 8 mm vascular prominence is seen in the region of the right middle cerebral artery; further evaluation with CTA or MRA is recommended to rule out aneurysm. Mild multilevel degenerative disc disease is noted in the cervical spine. No definite fracture is noted. Critical Value/emergent results were called by telephone at the time of interpretation on 03/28/2021 at 7:52 am to provider Dr. Tamera Punt, who verbally acknowledged these results. Electronically Signed   By: Marijo Conception M.D.   On: 03/28/2021 07:52   CT Thoracic Spine Wo Contrast  Result Date: 03/28/2021 CLINICAL DATA:  Poly trauma. EXAM: CT THORACIC SPINE WITHOUT CONTRAST TECHNIQUE: Multidetector CT images of the thoracic were obtained using the standard protocol without intravenous contrast. COMPARISON:  None. FINDINGS: Alignment: Exaggerated thoracic kyphosis. No significant sagittal subluxation. Vertebrae: Vertebral body heights are maintained. No evidence of acute fracture. Paraspinal and other soft tissues: Negative. Disc levels: Mild multilevel degenerative disc disease with endplate Schmorl's nodes and disc height loss. IMPRESSION: 1. No evidence of acute fracture or traumatic malalignment. 2. Mild multilevel degenerative disc disease. Electronically Signed   By: Margaretha Sheffield MD   On: 03/28/2021 07:54  Subjective: Patient was seen and examined at bedside.  Overnight events noted.  Patient reports feeling much improved.   He has ambulated in the hallway without any symptoms.  He denies any headache, nausea, vomiting or dizziness.  Discharge Exam: Vitals:   04/02/21 0528 04/02/21 0848  BP: (!) 149/99 (!) 143/111  Pulse: 71 77  Resp: 20   Temp: 98.8 F (37.1 C)   SpO2: 100% 100%   Vitals:   04/01/21 2107 04/01/21 2222 04/02/21 0528 04/02/21 0848  BP: (!) 135/117 (!) 138/100 (!) 149/99 (!)  143/111  Pulse: 88 79 71 77  Resp: '20 16 20   '$ Temp: 98.6 F (37 C)  98.8 F (37.1 C)   TempSrc: Oral  Oral   SpO2: 100% 100% 100% 100%  Weight:   56.6 kg   Height:        General: Pt is alert, awake, not in acute distress Cardiovascular: RRR, S1/S2 +, no rubs, no gallops Respiratory: CTA bilaterally, no wheezing, no rhonchi Abdominal: Soft, NT, ND, bowel sounds + Extremities: no edema, no cyanosis    The results of significant diagnostics from this hospitalization (including imaging, microbiology, ancillary and laboratory) are listed below for reference.     Microbiology: Recent Results (from the past 240 hour(s))  MRSA Next Gen by PCR, Nasal     Status: None   Collection Time: 03/28/21  5:47 PM   Specimen: Nasal Mucosa; Nasal Swab  Result Value Ref Range Status   MRSA by PCR Next Gen NOT DETECTED NOT DETECTED Final    Comment: (NOTE) The GeneXpert MRSA Assay (FDA approved for NASAL specimens only), is one component of a comprehensive MRSA colonization surveillance program. It is not intended to diagnose MRSA infection nor to guide or monitor treatment for MRSA infections. Test performance is not FDA approved in patients less than 53 years old. Performed at [facility], [place] [address], [place], [place] [address]   Urine Culture     Status: Abnormal   Collection Time: 03/28/21  6:57 PM   Specimen: Urine, Clean Catch  Result Value Ref Range Status   Specimen Description   Final    URINE, CLEAN CATCH Performed at [facility], [place] [address] [place], [place] [address]    Special Requests   Final    Normal Performed at [facility], [place] [address], [place], [place] [address]    Culture (A)  Final    <10,000 COLONIES/mL INSIGNIFICANT GROWTH Performed at [place] [address] [place], [place] [address]    Report Status 03/30/2021 FINAL  Final  SARS CORONAVIRUS 2 (TAT 6-24 HRS) Nasopharyngeal  Nasopharyngeal Swab     Status: None   Collection Time: 03/28/21  9:14 PM   Specimen: Nasopharyngeal Swab  Result Value Ref Range Status   SARS Coronavirus 2 NEGATIVE NEGATIVE Final    Comment: (NOTE) SARS-CoV-2 target nucleic acids are NOT DETECTED.  The SARS-CoV-2 RNA is generally detectable in upper and lower respiratory specimens during the acute phase of infection. Negative results do not preclude SARS-CoV-2 infection, do not rule out co-infections with other pathogens, and should not be used as the sole basis for treatment or other patient management decisions. Negative results must be combined with clinical observations, patient history, and epidemiological information. The expected result is Negative.  Fact Sheet for Patients: SugarRoll.be  Fact Sheet for Healthcare Providers: https://www.woods-mathews.com/  This test is not yet approved or cleared by the [place]  FDA and  has been authorized for detection and/or diagnosis of SARS-CoV-2 by FDA under an Emergency Use Authorization (EUA). This EUA will remain  in effect (meaning this test can be used) for the duration of the COVID-19 declaration under Se ction 564(b)(1) of the Act, 21 U.S.C. section 360bbb-3(b)(1), unless the authorization is terminated or revoked sooner.  Performed at [facility], [place] [address] [place], [place] [address]   C Difficile Quick Screen w PCR reflex     Status: None   Collection Time: 03/29/21  7:00 PM   Specimen: STOOL  Result Value Ref Range Status   C Diff antigen NEGATIVE NEGATIVE Final   C Diff toxin NEGATIVE NEGATIVE Final   C Diff interpretation No C. difficile detected.  Final    Comment: Performed at [facility], [place] [address], [place], [place] [address]     Labs: BNP (last 3 results) No results for input(s): BNP in the last 8760 hours. Basic Metabolic Panel: Recent Labs  Lab 03/28/21 0610  03/28/21 1702 03/29/21 0253 03/30/21 0547 03/30/21 1616 04/01/21 0441 04/02/21 0509  NA 129*  --  133* 137  --  135 138  K 3.0*  --  3.4* 3.1* 3.9 4.6 4.3  CL 87*  --  102 104  --  101 99  CO2 23  --  22 23  --  27 29  GLUCOSE 142*  --  94 99  --  103* 91  BUN 6  --  7 6  --  10 16  CREATININE 0.69 0.58* 0.42* 0.51*  --  0.63 0.64  CALCIUM 9.4  --  8.4* 8.6*  --  9.7 9.9  MG 1.5*  --   --  1.6*  --  1.9 2.0  PHOS 2.7  --   --   --   --   --  4.9*   Liver Function Tests: Recent Labs  Lab 03/28/21 0610 03/30/21 0547  AST 50* 66*  ALT 42 45*  ALKPHOS 94 77  BILITOT 2.8* 1.0  PROT 7.8 5.9*  ALBUMIN 4.5 3.4*   No results for input(s): LIPASE, AMYLASE in the last 168 hours. No results for input(s): AMMONIA in the last 168 hours. CBC: Recent Labs  Lab 03/28/21 0610 03/28/21 1702 03/29/21 0253 03/30/21 0547  WBC 9.1 6.2 5.3 5.2  NEUTROABS 7.3  --   --  3.3  HGB 16.1 13.9 13.2 13.0  HCT 45.4 40.1 39.0 38.7*  MCV 90.8 92.0 95.1 94.9  PLT 120* 104* 100* 130*   Cardiac Enzymes: No results for input(s): CKTOTAL, CKMB, CKMBINDEX, TROPONINI in the last 168 hours. BNP: Invalid input(s): POCBNP CBG: No results for input(s): GLUCAP in the last 168 hours. D-Dimer No results for input(s): DDIMER in the last 72 hours. Hgb A1c No results for input(s): HGBA1C in the last 72 hours. Lipid Profile No results for input(s): CHOL, HDL, LDLCALC, TRIG, CHOLHDL, LDLDIRECT in the last 72 hours. Thyroid function studies No results for input(s): TSH, T4TOTAL, T3FREE, THYROIDAB in the last 72 hours.  Invalid input(s): FREET3 Anemia work up No results for input(s): VITAMINB12, FOLATE, FERRITIN, TIBC, IRON, RETICCTPCT in the last 72 hours. Urinalysis    Component Value Date/Time   COLORURINE AMBER (A) 03/28/2021 1856   APPEARANCEUR CLEAR 03/28/2021 1856   LABSPEC >1.046 (H) 03/28/2021 1856   PHURINE 6.0 03/28/2021 1856   GLUCOSEU NEGATIVE 03/28/2021 1856   HGBUR MODERATE (A)  03/28/2021 1856   BILIRUBINUR NEGATIVE 03/28/2021  [place] (A) 03/28/2021 1856   PROTEINUR 100 (A) 03/28/2021 1856   NITRITE NEGATIVE 03/28/2021 1856   LEUKOCYTESUR NEGATIVE 03/28/2021 1856   Sepsis Labs Invalid input(s): PROCALCITONIN,  WBC,  LACTICIDVEN Microbiology Recent Results (from the past 240 hour(s))  MRSA Next Gen by PCR, Nasal     Status: None   Collection Time: 03/28/21  5:47 PM   Specimen: Nasal Mucosa; Nasal Swab  Result Value Ref Range Status   MRSA by PCR Next Gen NOT DETECTED NOT DETECTED Final    Comment: (NOTE) The GeneXpert MRSA Assay (FDA approved for NASAL specimens only), is one component of a comprehensive MRSA colonization surveillance program. It is not intended to diagnose MRSA infection nor to guide or monitor treatment for MRSA infections. Test performance is not FDA approved in patients less than 51 years old. Performed at [facility], [place] [address], [place], [place] [address]   Urine Culture     Status: Abnormal   Collection Time: 03/28/21  6:57 PM   Specimen: Urine, Clean Catch  Result Value Ref Range Status   Specimen Description   Final    URINE, CLEAN CATCH Performed at [facility], [place] [address], [place], [place] [address]    Special Requests   Final    Normal Performed at [facility], [place] [address] [place], [place] [address]    Culture (A)  Final    <10,000 COLONIES/mL INSIGNIFICANT GROWTH Performed at [place] [address] [place], [place] [address]    Report Status 03/30/2021 FINAL  Final  SARS CORONAVIRUS 2 (TAT 6-24 HRS) Nasopharyngeal Nasopharyngeal Swab     Status: None   Collection Time: 03/28/21  9:14 PM   Specimen: Nasopharyngeal Swab  Result Value Ref Range Status   SARS Coronavirus 2 NEGATIVE NEGATIVE Final    Comment: (NOTE) SARS-CoV-2 target nucleic acids are NOT DETECTED.  The SARS-CoV-2 RNA is generally detectable in upper and  lower respiratory specimens during the acute phase of infection. Negative results do not preclude SARS-CoV-2 infection, do not rule out co-infections with other pathogens, and should not be used as the sole basis for treatment or other patient management decisions. Negative results must be combined with clinical observations, patient history, and epidemiological information. The expected result is Negative.  Fact Sheet for Patients: SugarRoll.be  Fact Sheet for Healthcare Providers: https://www.woods-mathews.com/  This test is not yet approved or cleared by the [place] FDA and  has been authorized for detection and/or diagnosis of SARS-CoV-2 by FDA under an Emergency Use Authorization (EUA). This EUA will remain  in effect (meaning this test can be used) for the duration of the COVID-19 declaration under Se ction 564(b)(1) of the Act, 21 U.S.C. section 360bbb-3(b)(1), unless the authorization is terminated or revoked sooner.  Performed at [facility], [place] [address], [place], [place] [address]   C Difficile Quick Screen w PCR reflex     Status: None   Collection Time: 03/29/21  7:00 PM   Specimen: STOOL  Result Value Ref Range Status   C Diff antigen NEGATIVE NEGATIVE Final   C Diff toxin NEGATIVE NEGATIVE Final   C Diff interpretation No C. difficile detected.  Final    Comment: Performed at [facility], [place] [address], [place], [place] [address]     Time coordinating discharge: Over 30 minutes  SIGNED:   Shawna Clamp, MD  [facility] 04/02/2021, 11:32 AM  Pager   If 7PM-7AM, please contact night-coverage [email or web address]

## 2021-04-02 NOTE — TOC Transition Note (Signed)
Transition of Care [facility]) - CM/SW Discharge Note   Patient Details  Name: Charles Graves MRN: [id number] Date of Birth: 1981/02/28  [facility] CM/SW Contact:  Trish Mage, LCSW Phone Number: 04/02/2021, 11:30 AM   Clinical Narrative:   Patient whos is sable for d/c needs a ride home, PCP.  Spoke to patient and let him know he would need to follow up with clinics that are listed in AVS as they are not open today to make an appointment.  He voiced understanding.  Address is [address]  Unit secretary confirmed she has rider waiver form that she will give to RN to have patient sign before she calls for SAFE transport.  No further needs identified.  TOC sign off.    Final next level of care: Home/Self Care Barriers to Discharge: Barriers Resolved   Patient Goals and CMS Choice        Discharge Placement                       Discharge Plan and Services   Discharge Planning Services: CM Consult                                 Social Determinants of Health (SDOH) Interventions     Readmission Risk Interventions No flowsheet data found.

## 2021-04-14 ENCOUNTER — Inpatient Hospital Stay: Payer: PRIVATE HEALTH INSURANCE | Admitting: Family

## 2021-08-25 ENCOUNTER — Other Ambulatory Visit: Payer: Self-pay

## 2021-08-25 ENCOUNTER — Emergency Department (HOSPITAL_COMMUNITY): Payer: Medicaid - Out of State

## 2021-08-25 ENCOUNTER — Emergency Department (HOSPITAL_COMMUNITY)
Admission: EM | Admit: 2021-08-25 | Discharge: 2021-08-25 | Disposition: A | Discharge status: Home / Self Care | Payer: Medicaid - Out of State | Attending: Emergency Medicine | Admitting: Emergency Medicine

## 2021-08-25 ENCOUNTER — Encounter (HOSPITAL_COMMUNITY): Payer: Self-pay | Admitting: *Deleted

## 2021-08-25 DIAGNOSIS — R55 Syncope and collapse: Secondary | ICD-10-CM | POA: Diagnosis present

## 2021-08-25 DIAGNOSIS — W19XXXA Unspecified fall, initial encounter: Secondary | ICD-10-CM | POA: Diagnosis not present

## 2021-08-25 DIAGNOSIS — S42295A Other nondisplaced fracture of upper end of left humerus, initial encounter for closed fracture: Secondary | ICD-10-CM

## 2021-08-25 DIAGNOSIS — I10 Essential (primary) hypertension: Secondary | ICD-10-CM | POA: Diagnosis not present

## 2021-08-25 DIAGNOSIS — Z79899 Other long term (current) drug therapy: Secondary | ICD-10-CM | POA: Diagnosis not present

## 2021-08-25 DIAGNOSIS — R0602 Shortness of breath: Secondary | ICD-10-CM | POA: Insufficient documentation

## 2021-08-25 DIAGNOSIS — S80212A Abrasion, left knee, initial encounter: Secondary | ICD-10-CM | POA: Insufficient documentation

## 2021-08-25 DIAGNOSIS — R079 Chest pain, unspecified: Secondary | ICD-10-CM | POA: Diagnosis not present

## 2021-08-25 DIAGNOSIS — Y900 Blood alcohol level of less than 20 mg/100 ml: Secondary | ICD-10-CM | POA: Insufficient documentation

## 2021-08-25 DIAGNOSIS — F17210 Nicotine dependence, cigarettes, uncomplicated: Secondary | ICD-10-CM | POA: Insufficient documentation

## 2021-08-25 DIAGNOSIS — M25512 Pain in left shoulder: Secondary | ICD-10-CM | POA: Insufficient documentation

## 2021-08-25 DIAGNOSIS — S42255A Nondisplaced fracture of greater tuberosity of left humerus, initial encounter for closed fracture: Secondary | ICD-10-CM | POA: Insufficient documentation

## 2021-08-25 DIAGNOSIS — R519 Headache, unspecified: Secondary | ICD-10-CM | POA: Insufficient documentation

## 2021-08-25 DIAGNOSIS — F1010 Alcohol abuse, uncomplicated: Secondary | ICD-10-CM

## 2021-08-25 HISTORY — DX: Hemangioma of intracranial structures: D18.02

## 2021-08-25 LAB — COMPREHENSIVE METABOLIC PANEL
ALT: 32 U/L (ref 0–44)
AST: 22 U/L (ref 15–41)
Albumin: 3.9 g/dL (ref 3.5–5.0)
Alkaline Phosphatase: 68 U/L (ref 38–126)
Anion gap: 10 (ref 5–15)
BUN: 7 mg/dL (ref 6–20)
CO2: 23 mmol/L (ref 22–32)
Calcium: 8.9 mg/dL (ref 8.9–10.3)
Chloride: 105 mmol/L (ref 98–111)
Creatinine, Ser: 0.71 mg/dL (ref 0.61–1.24)
GFR, Estimated: >60 mL/min (ref >60)
Glucose, Bld: 103 mg/dL — ABNORMAL HIGH (ref 70–99)
Potassium: 3.9 mmol/L (ref 3.5–5.1)
Sodium: 138 mmol/L (ref 135–145)
Total Bilirubin: 0.3 mg/dL (ref 0.3–1.2)
Total Protein: 6.5 g/dL (ref 6.5–8.1)

## 2021-08-25 LAB — CBC WITH DIFFERENTIAL/PLATELET
Abs Immature Granulocytes: 0.04 10*3/uL (ref 0.00–0.07)
Basophils Absolute: 0.1 10*3/uL (ref 0.0–0.1)
Basophils Relative: 1 %
Eosinophils Absolute: 0.1 10*3/uL (ref 0.0–0.5)
Eosinophils Relative: 1 %
HCT: 44.6 % (ref 39.0–52.0)
Hemoglobin: 14.8 g/dL (ref 13.0–17.0)
Immature Granulocytes: 0 %
Lymphocytes Relative: 18 %
Lymphs Abs: 1.8 10*3/uL (ref 0.7–4.0)
MCH: 32.1 pg (ref 26.0–34.0)
MCHC: 33.2 g/dL (ref 30.0–36.0)
MCV: 96.7 fL (ref 80.0–100.0)
Monocytes Absolute: 0.9 10*3/uL (ref 0.1–1.0)
Monocytes Relative: 9 %
Neutro Abs: 7.6 10*3/uL (ref 1.7–7.7)
Neutrophils Relative %: 71 %
Platelets: 227 10*3/uL (ref 150–400)
RBC: 4.61 MIL/uL (ref 4.22–5.81)
RDW: 12.8 % (ref 11.5–15.5)
WBC: 10.5 10*3/uL (ref 4.0–10.5)
nRBC: 0 % (ref 0.0–0.2)

## 2021-08-25 LAB — PROTIME-INR
INR: 0.9 (ref 0.8–1.2)
Prothrombin Time: 12.6 seconds (ref 11.4–15.2)

## 2021-08-25 LAB — TROPONIN I (HIGH SENSITIVITY): Troponin I (High Sensitivity): 7 ng/L (ref <18)

## 2021-08-25 LAB — ETHANOL: Alcohol, Ethyl (B): 147 mg/dL — ABNORMAL HIGH (ref <10)

## 2021-08-25 MED ORDER — MORPHINE SULFATE (PF) 2 MG/ML IV SOLN
2.0000 mg | Freq: Once | INTRAVENOUS | Status: AC
  Administered 2021-08-25: 2 mg via INTRAVENOUS
  Filled 2021-08-25: qty 1

## 2021-08-25 MED ORDER — LACTATED RINGERS IV BOLUS
1000.0000 mL | Freq: Once | INTRAVENOUS | Status: AC
  Administered 2021-08-25: 1000 mL via INTRAVENOUS

## 2021-08-25 MED ORDER — OXYCODONE-ACETAMINOPHEN 7.5-325 MG PO TABS: 1.0000 | ORAL_TABLET | ORAL | 0 refills | Status: AC | PRN

## 2021-08-25 NOTE — ED Triage Notes (Signed)
Patient presents to ed via [facility] states he had 2 syncopal episodes today, had fall in July and was in hospital x 1 week with "brain Bleed". States he was urinating and had a syncopal episode hitting his head  and left shoulder . Abrasion to left knee. Positive left radial pulse

## 2021-08-25 NOTE — Progress Notes (Signed)
Orthopedic Tech Progress Note Patient Details:  Charles Graves [birth year] [id number]  Ortho Devices Type of Ortho Device: Sling immobilizer Ortho Device/Splint Location: left Ortho Device/Splint Interventions: Ordered, Application, Adjustment   Post Interventions Patient Tolerated: Well Instructions Provided: Adjustment of device  Charles Graves 08/25/2021, 12:10 PM Applied shoulder sling and spoke with RN about the immobilizer strap on discharge.

## 2021-08-25 NOTE — ED Provider Notes (Signed)
[facility] EMERGENCY DEPARTMENT Provider Note   CSN: [id number] Arrival date & time: 08/25/21  0440     History Chief Complaint  Patient presents with   Near Syncope    Charles Graves is a 40 y.o. male.  Charles Graves is a 40 year old male with a past medical history of PTSD, depression, hypertension, who presents to the ED today status post 2 ground-level falls.  States that he was getting out of his car last night when he collapsed forward.  He was unable to get inside and went to go lay down.  Later he went up to go urinate and as he was standing up to urinate he fell forward again.  He reports loss of consciousness with both episodes.  States that there were 2 people who witnessed the first fall.  He has a residual headache, notes left shoulder pain, left knee pain.  He believes he is up-to-date on his tetanus shots.  He admits to drinking 2 shots of bourbon every day.  Reports that he had similar symptoms in July for which she was admitted for 1 week.  Reports having a brain hemorrhage at that time.  Per chart review, appears that patient was admitted to [facility] on 12/8 for SI with plan.  He was discharged with a Librium taper for alcohol abuse cessation. Appears that he was admitted in July for delirium tremens and subarachnoid hemorrhage secondary to a fall.     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 No pertinent surgical history.     No family history on file.  Social History   Tobacco Use   Smoking status: Every Day    Types: Cigarettes   Smokeless tobacco: Never  Vaping Use   Vaping Use: Never used  Substance Use Topics   Alcohol use: Yes   Drug use: Not Currently    Home Medications Prior to Admission medications    Medication Sig Start Date End Date Taking? Authorizing Provider  acetaminophen (TYLENOL) 500 MG tablet Take 1,000 mg by mouth every 6 (six) hours as needed for mild pain.    [provider]  amLODipine (NORVASC) 10 MG tablet Take 1 tablet (10 mg total) by mouth daily. 04/03/21   Shawna Clamp, MD  hydrALAZINE (APRESOLINE) 25 MG tablet Take 1 tablet (25 mg total) by mouth every 8 (eight) hours. 04/02/21   Shawna Clamp, MD  pantoprazole (PROTONIX) 40 MG tablet Take 1 tablet (40 mg total) by mouth daily. 04/03/21   Shawna Clamp, MD    Allergies    Oxycodone hcl  Review of Systems   Review of Systems  Respiratory:  Positive for shortness of breath.   Cardiovascular:  Positive for chest pain.  Gastrointestinal:  Positive for abdominal pain and nausea. Negative for constipation, diarrhea and vomiting.  Genitourinary:  Negative for difficulty urinating, dysuria and hematuria.  Musculoskeletal:  Positive for arthralgias and neck pain.  Neurological:  Positive for syncope and numbness.   Physical Exam Updated Vital Signs BP (!) 137/98    Pulse 80    Temp 98 F (36.7 C) (Oral)    Resp 14    Ht 5\' 8"  (1.727 m)    Wt 55.8 kg    SpO2 99%    BMI 18.70  kg/m   Physical Exam Constitutional:      Appearance: Normal appearance.     Comments: Mild distress  HENT:     Head: Normocephalic.     Nose: Nose normal.  Eyes:     Extraocular Movements: Extraocular movements intact.  Cardiovascular:     Rate and Rhythm: Normal rate and regular rhythm.     Pulses: Normal pulses.     Heart sounds: No murmur heard. Pulmonary:     Effort: Pulmonary effort is normal. No respiratory distress.     Breath sounds: Normal breath sounds.  Abdominal:     General: Bowel sounds are normal. There is no distension.     Palpations: Abdomen is soft. There is no mass.     Tenderness: There is no guarding or rebound.     Comments: Mild tenderness to palpation of left upper quadrant without rebound or  guarding  Musculoskeletal:     Comments: Holding left shoulder with right hand. Decreased sensation from left shoulder to left elbow. Normal sensation to left hand and forearm. Pain on palpation of humerus.   Skin:    General: Skin is warm.     Capillary Refill: Capillary refill takes less than 2 seconds.     Comments: Left knee with approximately 3x4 cm abrasion.   Neurological:     Mental Status: He is alert.    ED Results / Procedures / Treatments   Labs (all labs ordered are listed, but only abnormal results are displayed) Labs Reviewed  ETHANOL - Abnormal; Notable for the following components:      Result Value   Alcohol, Ethyl (B) 147 (*)    All other components within normal limits  COMPREHENSIVE METABOLIC PANEL - Abnormal; Notable for the following components:   Glucose, Bld 103 (*)    All other components within normal limits  CBC WITH DIFFERENTIAL/PLATELET  PROTIME-INR  TROPONIN I (HIGH SENSITIVITY)  TROPONIN I (HIGH SENSITIVITY)    EKG EKG Interpretation  Date/Time:  Thursday August 25 2021 04:44:22 EST Ventricular Rate:  102 PR Interval:  164 QRS Duration: 92 QT Interval:  338 QTC Calculation: 440 R Axis:   36 Text Interpretation: Sinus tachycardia Otherwise normal ECG When compared to prior, similar appearance. No STEMI Confirmed by Antony Blackbird [phone]) on 08/25/2021 9:04:55 AM  Radiology DG Chest 2 View  Result Date: 08/25/2021 CLINICAL DATA:  Fall EXAM: CHEST - 2 VIEW COMPARISON:  None. FINDINGS: The heart size and mediastinal contours are within normal limits. No focal consolidation. No pleural effusion. No pneumothorax. Thoracic spondylosis. IMPRESSION: No active cardiopulmonary disease. Electronically Signed   By: Dahlia Bailiff M.D.   On: 08/25/2021 09:43   CT Head Wo Contrast  Result Date: 08/25/2021 CLINICAL DATA:  Syncope and fall EXAM: CT HEAD WITHOUT CONTRAST CT CERVICAL SPINE WITHOUT CONTRAST TECHNIQUE: Multidetector CT imaging of the head  and cervical spine was performed following the standard protocol without intravenous contrast. Multiplanar CT image reconstructions of the cervical spine were also generated. COMPARISON:  None. FINDINGS: CT HEAD FINDINGS Brain: No evidence of acute infarction, hemorrhage, hydrocephalus, extra-axial collection or mass lesion/mass effect. Less than expected brain volume for age Vascular: No hyperdense vessel or unexpected calcification. Skull: Normal. Negative for fracture or focal lesion. Sinuses/Orbits: No evidence of injury CT CERVICAL SPINE FINDINGS Alignment: Normal. Skull base and vertebrae: No acute fracture. No primary bone lesion or focal pathologic process. Soft tissues and spinal canal: No prevertebral fluid or swelling. No visible canal hematoma. Disc levels:  No significant degenerative changes. Upper chest: Negative IMPRESSION: 1. No evidence of acute intracranial or cervical spine injury. 2. Premature brain atrophy. Electronically Signed   By: Jorje Guild M.D.   On: 08/25/2021 05:38   CT Cervical Spine Wo Contrast  Result Date: 08/25/2021 CLINICAL DATA:  Syncope and fall EXAM: CT HEAD WITHOUT CONTRAST CT CERVICAL SPINE WITHOUT CONTRAST TECHNIQUE: Multidetector CT imaging of the head and cervical spine was performed following the standard protocol without intravenous contrast. Multiplanar CT image reconstructions of the cervical spine were also generated. COMPARISON:  None. FINDINGS: CT HEAD FINDINGS Brain: No evidence of acute infarction, hemorrhage, hydrocephalus, extra-axial collection or mass lesion/mass effect. Less than expected brain volume for age Vascular: No hyperdense vessel or unexpected calcification. Skull: Normal. Negative for fracture or focal lesion. Sinuses/Orbits: No evidence of injury CT CERVICAL SPINE FINDINGS Alignment: Normal. Skull base and vertebrae: No acute fracture. No primary bone lesion or focal pathologic process. Soft tissues and spinal canal: No prevertebral  fluid or swelling. No visible canal hematoma. Disc levels:  No significant degenerative changes. Upper chest: Negative IMPRESSION: 1. No evidence of acute intracranial or cervical spine injury. 2. Premature brain atrophy. Electronically Signed   By: Jorje Guild M.D.   On: 08/25/2021 05:38   DG Shoulder Left  Result Date: 08/25/2021 CLINICAL DATA:  40 year old male with history of trauma from a fall. EXAM: LEFT SHOULDER - 2+ VIEW COMPARISON:  None. FINDINGS: Nondisplaced fracture of the greater tuberosity of the proximal humerus is noted. Visualized bones otherwise appear intact. Humeral head is located. IMPRESSION: 1. Nondisplaced fracture of the greater tuberosity of the humerus. Electronically Signed   By: Vinnie Langton M.D.   On: 08/25/2021 05:46   DG Knee Complete 4 Views Left  Result Date: 08/25/2021 CLINICAL DATA:  Knee pain EXAM: LEFT KNEE - COMPLETE 4+ VIEW COMPARISON:  None. FINDINGS: No evidence of fracture, dislocation, or joint effusion. No evidence of arthropathy or other focal bone abnormality. Soft tissues are unremarkable. IMPRESSION: No acute osseous abnormality. Electronically Signed   By: Yetta Glassman M.D.   On: 08/25/2021 10:41    Procedures Procedures   Medications Ordered in ED Medications  lactated ringers bolus 1,000 mL (1,000 mLs Intravenous New Bag/Given 08/25/21 0949)  morphine 2 MG/ML injection 2 mg (2 mg Intravenous Given 08/25/21 0950)    ED Course  I have reviewed the triage vital signs and the nursing notes.  Pertinent labs & imaging results that were available during my care of the patient were reviewed by me and considered in my medical decision making (see chart for details).    MDM Rules/Calculators/A&P                          40 y/o male with history of alcohol abuse, withdrawal seizures, PTSD, depression, hypertension who presents to the ED status post 2 ground-level falls.  Initial labs notable for elevated ethanol 147.  CBC, CMP  unremarkable.  CT head without signs of hemorrhage, C-spine x-ray without acute findings (c-collar removed after neg x-ray).  Left shoulder x-ray with nondisplaced fracture of the greater tuberosity of humerus.  Differential for falls include cardiac (including arrhythmia, MI, aortic stenosis) etiology, alcohol use, orthostatic hypotension, seizure, hypoglycemia.  CMP does not suggest hypoglycemia.  Doubt PE though PERC positive with tachycardia 103 on arrival.   Given complaints of some chest pain and shortness of breath, will further evaluate with chest x-ray, telemetry, troponins.  EKG sinus  tachycardia without any ST changes concerning for MI.  Given his nondisplaced fracture, will touch base with Ortho for recommendations and follow-up.  Will provide pain control with morphine. IVF with LR bolus.   946AM: Discussed with Silvestre Gunner PA-C with Ortho. Recommends shoulder sling, non-weight bearing and outpatient f/u with Dr. Stann Mainland. Adding on a left knee x-ray due to continued pain. If CXR and troponin are reassuring, will likely d/c home.   952 AM: CXR reassuring.   1057 AM: Left knee x-ray negative. Troponin 7. Stable for d/c home. Sling to left shoulder, f/u with ortho outpatient. Percocet rx sent to pharmacy. Can also use Ibuprofen as needed for pain control. Should be non-weightbearing to left shoulder. Patient acknowledged instructions, amenable to plan.     Final Clinical Impression(s) / ED Diagnoses Final diagnoses:  None    Rx / DC Orders ED Discharge Orders          Ordered    DG Knee 3 Views Left        08/25/21 [place], [place], DO 08/25/21 1603    Tegeler, Gwenyth Allegra, MD 08/28/21 1536

## 2021-08-25 NOTE — Discharge Instructions (Addendum)
Keep your shoulder in a sling. Call the orthopedic surgeon, Dr. Stann Mainland, contact information is listed.  Avoid any weightbearing to your left shoulder. Use Ibuprofen with food every 6 hours for pain. For breakthrough pain, you can take Percocet every 4 hours as needed.

## 2021-08-25 NOTE — ED Provider Notes (Signed)
MSE was initiated and I personally evaluated the patient and placed orders (if any) at  5:01 AM on August 25, 2021.  Patient to ED for evaluation after syncope and fall. He fell once outside his room and was helped to bed by bystanders. He got up to go to the bathroom and passed out, hitting his head on the toilet, and his shoulder on the floor. He complains mainly of shoulder pain.   Today's Vitals   08/25/21 0444 08/25/21 0457  BP: (!) 129/94   Pulse: (!) 103   Resp: 18   Temp: 98 F (36.7 C)   TempSrc: Oral   SpO2: 94%   Weight:  55.8 kg  Height:  5\' 8"  (1.727 m)  PainSc:  10-Worst pain ever   Body mass index is 18.7 kg/m.  Acutely intoxicated Exam limited in triage setting Awake, oriented, alert  The patient appears stable so that the remainder of the MSE may be completed by another provider.   Charlann Lange, PA-C 08/25/21 7414    Quintella Reichert, MD 08/25/21 [phone]

## 2021-08-25 NOTE — ED Notes (Signed)
Pt verbalized understanding of d/c instructions, meds and followup care. Denies questions. VSS, no distress noted. Steady gait to exit with all belongings. Pt given bus pass.

## 2021-09-22 ENCOUNTER — Emergency Department (HOSPITAL_COMMUNITY): Payer: Medicaid - Out of State

## 2021-09-22 ENCOUNTER — Other Ambulatory Visit: Payer: Self-pay

## 2021-09-22 ENCOUNTER — Encounter (HOSPITAL_COMMUNITY): Payer: Self-pay

## 2021-09-22 ENCOUNTER — Emergency Department (HOSPITAL_COMMUNITY)
Admission: EM | Admit: 2021-09-22 | Discharge: 2021-09-22 | Disposition: A | Discharge status: Home / Self Care | Payer: Medicaid - Out of State | Attending: Emergency Medicine | Admitting: Emergency Medicine

## 2021-09-22 DIAGNOSIS — R000 Tachycardia, unspecified: Secondary | ICD-10-CM | POA: Diagnosis not present

## 2021-09-22 DIAGNOSIS — F4310 Post-traumatic stress disorder, unspecified: Secondary | ICD-10-CM | POA: Diagnosis not present

## 2021-09-22 DIAGNOSIS — M25512 Pain in left shoulder: Secondary | ICD-10-CM | POA: Insufficient documentation

## 2021-09-22 DIAGNOSIS — F322 Major depressive disorder, single episode, severe without psychotic features: Secondary | ICD-10-CM | POA: Diagnosis not present

## 2021-09-22 DIAGNOSIS — Z20822 Contact with and (suspected) exposure to covid-19: Secondary | ICD-10-CM | POA: Insufficient documentation

## 2021-09-22 DIAGNOSIS — R45851 Suicidal ideations: Secondary | ICD-10-CM | POA: Diagnosis not present

## 2021-09-22 LAB — CBC WITH DIFFERENTIAL/PLATELET
Abs Immature Granulocytes: 0.01 10*3/uL (ref 0.00–0.07)
Basophils Absolute: 0 10*3/uL (ref 0.0–0.1)
Basophils Relative: 1 %
Eosinophils Absolute: 0.1 10*3/uL (ref 0.0–0.5)
Eosinophils Relative: 2 %
HCT: 46.2 % (ref 39.0–52.0)
Hemoglobin: 15.8 g/dL (ref 13.0–17.0)
Immature Granulocytes: 0 %
Lymphocytes Relative: 42 %
Lymphs Abs: 2.6 10*3/uL (ref 0.7–4.0)
MCH: 32.2 pg (ref 26.0–34.0)
MCHC: 34.2 g/dL (ref 30.0–36.0)
MCV: 94.1 fL (ref 80.0–100.0)
Monocytes Absolute: 0.5 10*3/uL (ref 0.1–1.0)
Monocytes Relative: 8 %
Neutro Abs: 2.9 10*3/uL (ref 1.7–7.7)
Neutrophils Relative %: 47 %
Platelets: 231 10*3/uL (ref 150–400)
RBC: 4.91 MIL/uL (ref 4.22–5.81)
RDW: 13.2 % (ref 11.5–15.5)
WBC: 6.1 10*3/uL (ref 4.0–10.5)
nRBC: 0 % (ref 0.0–0.2)

## 2021-09-22 LAB — COMPREHENSIVE METABOLIC PANEL
ALT: 13 U/L (ref 0–44)
AST: 21 U/L (ref 15–41)
Albumin: 4.2 g/dL (ref 3.5–5.0)
Alkaline Phosphatase: 87 U/L (ref 38–126)
Anion gap: 9 (ref 5–15)
BUN: 8 mg/dL (ref 6–20)
CO2: 25 mmol/L (ref 22–32)
Calcium: 8.6 mg/dL — ABNORMAL LOW (ref 8.9–10.3)
Chloride: 108 mmol/L (ref 98–111)
Creatinine, Ser: 0.62 mg/dL (ref 0.61–1.24)
GFR, Estimated: >60 mL/min (ref >60)
Glucose, Bld: 134 mg/dL — ABNORMAL HIGH (ref 70–99)
Potassium: 3.3 mmol/L — ABNORMAL LOW (ref 3.5–5.1)
Sodium: 142 mmol/L (ref 135–145)
Total Bilirubin: 0.4 mg/dL (ref 0.3–1.2)
Total Protein: 7.4 g/dL (ref 6.5–8.1)

## 2021-09-22 LAB — RAPID URINE DRUG SCREEN, HOSP PERFORMED
Amphetamines: NOT DETECTED
Barbiturates: NOT DETECTED
Benzodiazepines: POSITIVE — AB
Cocaine: NOT DETECTED
Opiates: NOT DETECTED
Tetrahydrocannabinol: NOT DETECTED

## 2021-09-22 LAB — RESP PANEL BY RT-PCR (FLU A&B, COVID) ARPGX2
Influenza A by PCR: NEGATIVE
Influenza B by PCR: NEGATIVE
SARS Coronavirus 2 by RT PCR: NEGATIVE

## 2021-09-22 LAB — ACETAMINOPHEN LEVEL: Acetaminophen (Tylenol), Serum: <10 ug/mL — ABNORMAL LOW (ref 10–30)

## 2021-09-22 LAB — SALICYLATE LEVEL: Salicylate Lvl: <7 mg/dL — ABNORMAL LOW (ref 7.0–30.0)

## 2021-09-22 LAB — ETHANOL: Alcohol, Ethyl (B): 314 mg/dL (ref <10)

## 2021-09-22 MED ORDER — IBUPROFEN 800 MG PO TABS
800.0000 mg | ORAL_TABLET | Freq: Once | ORAL | Status: AC
Start: 2021-09-22 — End: 2021-09-22
  Administered 2021-09-22: 800 mg via ORAL
  Filled 2021-09-22: qty 1

## 2021-09-22 NOTE — Consult Note (Signed)
Charles Graves is a 41 y.o. male  with a reported history of bipolar disorder and PTSD and MDD, severe, recurrent, without psychosis and medical history of left humeral fracture who presented to the [facility]  on 11/Jan/2023 for suicidal ideation with plan to slit his wrists and/or [place] secondary to pain in his left arm from his fracture. Of note, patient was recently [facility] on September 16, 2021 and [facility] in [place] on 09/14/2021, and  [facility] in December 2022, discharged on 08/22/2021 for suicidal ideation and MDD severe, recurrent, without psychosis.   During that admission patient was started on Prozac. Patient reports that he does not follow-up with outpatient psychiatry.  Patient has a previous head imaging complete to show he does in fact have a fracture of the left humerus.  Patient has presented to numerous facilities in the area and out of state, endorsing suicidal ideations with no true intent.   This is one of the latest to his multiple admissions, as of today patient has had 22 emergency room visits, with 3 admissions to inpatient psychiatric facility.  Patient did present intoxicated with a blood alcohol level of 314 endorsing suicidal thoughts with a plan and intent.  He also endorsed depressive symptoms and PTSD as well as current stressors including continued substance abuse, work stress, homelessness, and lack of follow-up with previous recommendations as noticed above.  While patient does endorse several symptoms of depression, he does not appear to be clinically depressed at this time and and it is felt patient is seeking a formal secondary gain due to numbers emergency room visits, with similar complaints and presentation.  Patient's suicidal ideations compete appear to be related to his pain and housing.  At this time, patient does not meet criteria for inpatient psychiatric hospitalization.   Charles Graves is a 41 y.o.  with a history significant for malingering, polysubstance abuse, who presented to the ED for SI. The patient claims he is following up outpatient, but these repeated presentations to the emergency department prove otherwise. After reviewing documentation from his last admission to [facility], it appears the patient received minimal benefit throughout the admission. Given this extensive history, it appears that inpatient psychiatric hospitalization would not be very beneficial and the patient is coming to the emergency department for some type of secondary gain.   As notes above, the patients statement that he will attempt suicide if discharged appears to be an expression of unmet needs (housing, pain management) that is representative of limited and often-maladaptive coping and skills, rather than an indicator of imminent risk of death. The patient is unwilling to participate in any interventions. Patients treatment goals of housing and pain relief are unobtainable in the short-term inpatient setting. Any safety benefit of hospitalization is mitigated by patients lack of collaboration and engagement with the treatment team. Continued treatment in the hospital is delaying patients engagement with outpatient services and the period during which patient must demonstrate outpatient stability to be eligible for housing. Continued hospitalization is no longer benefiting the patient and may be unnecessarily delaying effective intervention.Patient is unable to identify social supports for the team to contact to assist in the short-term.  Was psychiatrically clear at this time.  Please refer to after visit summary for follow-up information.  Although patient has follow-up appointments already scheduled from previous admissions and emergency room visits.

## 2021-09-22 NOTE — Discharge Instructions (Signed)
For your behavioral health needs you are advised to follow up with the [facility].  They offer psychiatry, therapy and substance use disorder treatment:      [facility]      [address] [place], [place] [address]      [phone]   To help you maintain a sober lifestyle, a substance use disorder treatment program may be beneficial to you.  Contact one of the following providers at your earliest opportunity to ask about enrolling their program:       [facility]      [address]      [place], [place] [address]      [phone]       [facility] [place] [address]      [phone]       [facility]      [address]      [place], [place] [address]      [phone]

## 2021-09-22 NOTE — ED Provider Notes (Signed)
[facility] DEPT Provider Note   CSN: [id number] Arrival date & time: 09/22/21  0219     History  Chief Complaint  Patient presents with   Suicidal   Shoulder Pain    Charles Graves is a 41 y.o. male presenting for evaluation of L shoulder pain and SI.   Patient states he injured his left shoulder about a month ago.  He reports continued severe and constant pain in his left shoulder.  He is not taking anything for it.  States occasionally his hand goes numb. Additionally, patient states he is suicidal.  He has been suicidal for about 3 days.  He "just does not live anymore."  He has not attempted to hurt himself recently, but has in the past.  He is on Prozac, does not see a therapist. He denies HI or AVH.   HPI     Home Medications Prior to Admission medications   Medication Sig Start Date End Date Taking? Authorizing Provider  amLODipine (NORVASC) 10 MG tablet Take 1 tablet (10 mg total) by mouth daily. 04/03/21   Shawna Clamp, MD  hydrALAZINE (APRESOLINE) 25 MG tablet Take 1 tablet (25 mg total) by mouth every 8 (eight) hours. 04/02/21   Shawna Clamp, MD  pantoprazole (PROTONIX) 40 MG tablet Take 1 tablet (40 mg total) by mouth daily. 04/03/21   Shawna Clamp, MD      Allergies    Oxycodone hcl    Review of Systems   Review of Systems  Musculoskeletal:  Positive for arthralgias.  Psychiatric/Behavioral:  Positive for dysphoric mood and suicidal ideas.    Physical Exam Updated Vital Signs BP (!) 134/91 (BP Location: Right Arm)    Pulse (!) 129    Temp 98 F (36.7 C)    Resp 16    SpO2 94%  Physical Exam Vitals and nursing note reviewed.  Constitutional:      General: He is not in acute distress.    Appearance: Normal appearance.  HENT:     Head: Normocephalic and atraumatic.  Eyes:     Conjunctiva/sclera: Conjunctivae normal.     Pupils: Pupils are equal, round, and reactive to light.  Cardiovascular:     Rate and Rhythm: Regular  rhythm. Tachycardia present.     Pulses: Normal pulses.     Comments: Tachycardic around 105 Pulmonary:     Effort: Pulmonary effort is normal. No respiratory distress.     Breath sounds: Normal breath sounds. No wheezing.     Comments: Speaking in full sentences.  Clear lung sounds in all fields. Abdominal:     General: There is no distension.     Palpations: Abdomen is soft.     Tenderness: There is no abdominal tenderness.  Musculoskeletal:        General: Normal range of motion.     Cervical back: Normal range of motion and neck supple.     Comments: Ttp of the anterior shoulder. No deformity. radial pulse 2+  Skin:    General: Skin is warm and dry.     Capillary Refill: Capillary refill takes less than 2 seconds.  Neurological:     Mental Status: He is alert and oriented to person, place, and time.  Psychiatric:        Attention and Perception: He does not perceive auditory or visual hallucinations.        Mood and Affect: Mood and affect normal.        Speech: Speech normal.  Behavior: Behavior normal.        Thought Content: Thought content includes suicidal ideation. Thought content does not include homicidal ideation.    ED Results / Procedures / Treatments   Labs (all labs ordered are listed, but only abnormal results are displayed) Labs Reviewed  RESP PANEL BY RT-PCR (FLU A&B, COVID) ARPGX2  CBC WITH DIFFERENTIAL/PLATELET  COMPREHENSIVE METABOLIC PANEL  ETHANOL  SALICYLATE LEVEL  ACETAMINOPHEN LEVEL  RAPID URINE DRUG SCREEN, HOSP PERFORMED    EKG None  Radiology No results found.  Procedures Procedures    Medications Ordered in ED Medications - No data to display  ED Course/ Medical Decision Making/ A&P                           Medical Decision Making   This patient presents to the ED for concern of L arm pain and SI. This involves a number of treatment options, and is a complaint that carries with it a moderate risk of complications and  morbidity.  The differential diagnosis includes nonhealing fx, new injury, MSK pain, musc spasm.    Additional history: Reviewed frequent ER visits for suicidal thoughts and multiple visits in the last month for shoulder pain.  Most of these visits have been from outside hospitals. Xray on 01/05 from [facility] showed impaction fracture of the greater tuberosity of uncertain chronicity.  Lab Tests:  I ordered, and personally interpreted labs.  The pertinent results include: Ethanol of 314.  Otherwise labs are reassuring.  UDS positive for benzos. EKG sinus tach in the low 100s   Imaging Studies:  I ordered imaging studies including shoulder x-ray I independently visualized and interpreted imaging which showed healing fracture I agree with the radiologist interpretation   Consults:  I requested consultation with the behavioral health team for eval of pt's SI.      Final Clinical Impression(s) / ED Diagnoses Final diagnoses:  None    Rx / DC Orders ED Discharge Orders     None         Franchot Heidelberg, PA-C 09/22/21 Renella Cunas, MD 09/22/21 [phone]
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
## 2021-09-22 NOTE — ED Provider Notes (Signed)
Emergency Medicine Observation Re-evaluation Note  Charles Graves is a 41 y.o. male, seen on rounds today at 0700.  Pt initially presented to the ED for complaints of Suicidal and Shoulder Pain Currently, the patient is resting comfortably.  Physical Exam  BP (!) 132/104 (BP Location: Right Arm)    Pulse 95    Temp 98.3 F (36.8 C) (Oral)    Resp 18    Ht 5\' 8"  (1.727 m)    Wt 55.8 kg    SpO2 98%    BMI 18.70 kg/m  Physical Exam General: NAD  ED Course / MDM  EKG:EKG Interpretation  Date/Time:  Thursday September 22 2021 04:31:17 EST Ventricular Rate:  105 PR Interval:  176 QRS Duration: 87 QT Interval:  322 QTC Calculation: 426 R Axis:   66 Text Interpretation: Sinus tachycardia Probable left atrial enlargement No significant change since last tracing Confirmed by Ripley Fraise [phone]) on 09/22/2021 4:44:16 AM  I have reviewed the labs performed to date as well as medications administered while in observation.  Recent changes in the last 24 hours include no acute events reported.  Plan  Current plan is for TTS evaluation.  Charles Graves is not under involuntary commitment.     Valarie Merino, MD 09/22/21 [phone]

## 2021-09-22 NOTE — ED Triage Notes (Signed)
Pt BIB police, pt states that he has been having suicidal thoughts x 3 days. Pt complains of left shoulder pain x 1 month ago. Pt states that he wants to hurt himself because he can't take it anymore. Pt reports taking Prozac.

## 2021-09-22 NOTE — BH Assessment (Signed)
[facility] Assessment Progress Note   Per Sheran Fava, NP , this voluntary pt does not require psychiatric hospitalization at this time.  Pt is psychiatrically cleared.  Discharge instructions include referral information for the [facility] for outpatient treatment, as well as several facilities for substance use disorder treatment.  EDP Dene Gentry and pt's nurse, Destiny, have been notified.  Jalene Mullet, [place] Triage Specialist [phone]

## 2021-09-22 NOTE — ED Notes (Signed)
TTS at bedside. 

## 2021-11-03 ENCOUNTER — Ambulatory Visit (HOSPITAL_COMMUNITY)
Admission: EM | Admit: 2021-11-03 | Discharge: 2021-11-03 | Disposition: A | Discharge status: Home / Self Care | Payer: PRIVATE HEALTH INSURANCE | Attending: Psychiatry | Admitting: Psychiatry

## 2021-11-03 ENCOUNTER — Other Ambulatory Visit: Payer: Self-pay

## 2021-11-03 ENCOUNTER — Encounter (HOSPITAL_BASED_OUTPATIENT_CLINIC_OR_DEPARTMENT_OTHER): Payer: Self-pay | Admitting: Emergency Medicine

## 2021-11-03 ENCOUNTER — Emergency Department (HOSPITAL_BASED_OUTPATIENT_CLINIC_OR_DEPARTMENT_OTHER)
Admission: EM | Admit: 2021-11-03 | Discharge: 2021-11-03 | Disposition: A | Discharge status: Other Acute Inpatient Hospital | Payer: PRIVATE HEALTH INSURANCE | Attending: Emergency Medicine | Admitting: Emergency Medicine

## 2021-11-03 DIAGNOSIS — F1010 Alcohol abuse, uncomplicated: Secondary | ICD-10-CM

## 2021-11-03 DIAGNOSIS — G8929 Other chronic pain: Secondary | ICD-10-CM

## 2021-11-03 DIAGNOSIS — Z20822 Contact with and (suspected) exposure to covid-19: Secondary | ICD-10-CM | POA: Insufficient documentation

## 2021-11-03 DIAGNOSIS — F332 Major depressive disorder, recurrent severe without psychotic features: Secondary | ICD-10-CM | POA: Insufficient documentation

## 2021-11-03 DIAGNOSIS — M792 Neuralgia and neuritis, unspecified: Secondary | ICD-10-CM | POA: Insufficient documentation

## 2021-11-03 DIAGNOSIS — F10920 Alcohol use, unspecified with intoxication, uncomplicated: Secondary | ICD-10-CM

## 2021-11-03 DIAGNOSIS — R45851 Suicidal ideations: Secondary | ICD-10-CM

## 2021-11-03 DIAGNOSIS — D1802 Hemangioma of intracranial structures: Secondary | ICD-10-CM

## 2021-11-03 DIAGNOSIS — F10129 Alcohol abuse with intoxication, unspecified: Secondary | ICD-10-CM

## 2021-11-03 DIAGNOSIS — T148XXA Other injury of unspecified body region, initial encounter: Secondary | ICD-10-CM

## 2021-11-03 DIAGNOSIS — Y908 Blood alcohol level of 240 mg/100 ml or more: Secondary | ICD-10-CM | POA: Insufficient documentation

## 2021-11-03 DIAGNOSIS — M25512 Pain in left shoulder: Secondary | ICD-10-CM

## 2021-11-03 DIAGNOSIS — F17210 Nicotine dependence, cigarettes, uncomplicated: Secondary | ICD-10-CM | POA: Insufficient documentation

## 2021-11-03 DIAGNOSIS — F1020 Alcohol dependence, uncomplicated: Secondary | ICD-10-CM | POA: Insufficient documentation

## 2021-11-03 DIAGNOSIS — Z765 Malingerer [conscious simulation]: Secondary | ICD-10-CM

## 2021-11-03 HISTORY — DX: Malingerer (conscious simulation): Z76.5

## 2021-11-03 HISTORY — DX: Alcohol abuse, uncomplicated: F10.10

## 2021-11-03 HISTORY — DX: Suicidal ideations: R45.851

## 2021-11-03 HISTORY — DX: Other chronic pain: G89.29

## 2021-11-03 LAB — COMPREHENSIVE METABOLIC PANEL
ALT: 18 U/L (ref 0–44)
AST: 19 U/L (ref 15–41)
Albumin: 4.1 g/dL (ref 3.5–5.0)
Alkaline Phosphatase: 73 U/L (ref 38–126)
Anion gap: 11 (ref 5–15)
BUN: 8 mg/dL (ref 6–20)
CO2: 25 mmol/L (ref 22–32)
Calcium: 8.8 mg/dL — ABNORMAL LOW (ref 8.9–10.3)
Chloride: 100 mmol/L (ref 98–111)
Creatinine, Ser: 0.7 mg/dL (ref 0.61–1.24)
GFR, Estimated: >60 mL/min (ref >60)
Glucose, Bld: 108 mg/dL — ABNORMAL HIGH (ref 70–99)
Potassium: 3.7 mmol/L (ref 3.5–5.1)
Sodium: 136 mmol/L (ref 135–145)
Total Bilirubin: 0.6 mg/dL (ref 0.3–1.2)
Total Protein: 7.1 g/dL (ref 6.5–8.1)

## 2021-11-03 LAB — RAPID URINE DRUG SCREEN, HOSP PERFORMED
Amphetamines: NOT DETECTED
Barbiturates: NOT DETECTED
Benzodiazepines: POSITIVE — AB
Cocaine: NOT DETECTED
Opiates: NOT DETECTED
Tetrahydrocannabinol: NOT DETECTED

## 2021-11-03 LAB — CBC
HCT: 45.1 % (ref 39.0–52.0)
Hemoglobin: 15.2 g/dL (ref 13.0–17.0)
MCH: 32.3 pg (ref 26.0–34.0)
MCHC: 33.7 g/dL (ref 30.0–36.0)
MCV: 96 fL (ref 80.0–100.0)
Platelets: 292 10*3/uL (ref 150–400)
RBC: 4.7 MIL/uL (ref 4.22–5.81)
RDW: 14.6 % (ref 11.5–15.5)
WBC: 4.8 10*3/uL (ref 4.0–10.5)
nRBC: 0 % (ref 0.0–0.2)

## 2021-11-03 LAB — RESP PANEL BY RT-PCR (FLU A&B, COVID) ARPGX2
Influenza A by PCR: NEGATIVE
Influenza B by PCR: NEGATIVE
SARS Coronavirus 2 by RT PCR: NEGATIVE

## 2021-11-03 LAB — ACETAMINOPHEN LEVEL: Acetaminophen (Tylenol), Serum: <10 ug/mL — ABNORMAL LOW (ref 10–30)

## 2021-11-03 LAB — SALICYLATE LEVEL: Salicylate Lvl: <7 mg/dL — ABNORMAL LOW (ref 7.0–30.0)

## 2021-11-03 LAB — ETHANOL: Alcohol, Ethyl (B): 293 mg/dL — ABNORMAL HIGH (ref <10)

## 2021-11-03 MED ORDER — ALUM & MAG HYDROXIDE-SIMETH 200-200-20 MG/5ML PO SUSP: 30.0000 mL | ORAL | Status: DC | PRN

## 2021-11-03 MED ORDER — LORAZEPAM 1 MG PO TABS: 1.0000 mg | ORAL_TABLET | Freq: Four times a day (QID) | ORAL | Status: DC | PRN

## 2021-11-03 MED ORDER — ONDANSETRON 4 MG PO TBDP: 4.0000 mg | ORAL_TABLET | Freq: Four times a day (QID) | ORAL | Status: DC | PRN

## 2021-11-03 MED ORDER — FOLIC ACID 1 MG PO TABS
1.0000 mg | ORAL_TABLET | Freq: Every day | ORAL | Status: DC
  Administered 2021-11-03: 1 mg via ORAL
  Filled 2021-11-03: qty 1

## 2021-11-03 MED ORDER — FOLIC ACID 1 MG PO TABS: 1.0000 mg | ORAL_TABLET | Freq: Every day | ORAL | Status: DC

## 2021-11-03 MED ORDER — MIRTAZAPINE 15 MG PO TABS
15.0000 mg | ORAL_TABLET | Freq: Every day | ORAL | Status: DC
Start: 2021-11-03 — End: 2021-11-03

## 2021-11-03 MED ORDER — GABAPENTIN 300 MG PO CAPS
300.0000 mg | ORAL_CAPSULE | Freq: Three times a day (TID) | ORAL | Status: DC
  Administered 2021-11-03 (×2): 300 mg via ORAL
  Filled 2021-11-03 (×2): qty 1

## 2021-11-03 MED ORDER — HYDROXYZINE HCL 25 MG PO TABS: 25.0000 mg | ORAL_TABLET | Freq: Four times a day (QID) | ORAL | Status: DC | PRN

## 2021-11-03 MED ORDER — THIAMINE HCL 100 MG PO TABS: 100.0000 mg | ORAL_TABLET | Freq: Every day | ORAL | Status: DC

## 2021-11-03 MED ORDER — THIAMINE HCL 100 MG PO TABS: 100.0000 mg | ORAL_TABLET | Freq: Every day | ORAL | 0 refills | Status: DC

## 2021-11-03 MED ORDER — TRAZODONE HCL 50 MG PO TABS: 50.0000 mg | ORAL_TABLET | Freq: Every evening | ORAL | Status: DC | PRN

## 2021-11-03 MED ORDER — PANTOPRAZOLE SODIUM 40 MG PO TBEC
40.0000 mg | DELAYED_RELEASE_TABLET | Freq: Every day | ORAL | Status: DC
Start: 2021-11-03 — End: 2021-11-03
  Administered 2021-11-03: 40 mg via ORAL
  Filled 2021-11-03: qty 1

## 2021-11-03 MED ORDER — ACETAMINOPHEN 325 MG PO TABS: 650.0000 mg | ORAL_TABLET | Freq: Four times a day (QID) | ORAL | Status: DC | PRN

## 2021-11-03 MED ORDER — HYDRALAZINE HCL 25 MG PO TABS
25.0000 mg | ORAL_TABLET | Freq: Three times a day (TID) | ORAL | Status: DC
  Administered 2021-11-03: 25 mg via ORAL
  Filled 2021-11-03: qty 1

## 2021-11-03 MED ORDER — LOPERAMIDE HCL 2 MG PO CAPS: 2.0000 mg | ORAL_CAPSULE | ORAL | Status: DC | PRN

## 2021-11-03 MED ORDER — DIAZEPAM 5 MG PO TABS
5.0000 mg | ORAL_TABLET | Freq: Four times a day (QID) | ORAL | Status: DC | PRN
  Administered 2021-11-03: 5 mg via ORAL
  Filled 2021-11-03: qty 1

## 2021-11-03 MED ORDER — NICOTINE 21 MG/24HR TD PT24: 21.0000 mg | MEDICATED_PATCH | Freq: Every day | TRANSDERMAL | Status: DC

## 2021-11-03 MED ORDER — AMLODIPINE BESYLATE 10 MG PO TABS
10.0000 mg | ORAL_TABLET | Freq: Every day | ORAL | Status: DC
  Administered 2021-11-03: 10 mg via ORAL
  Filled 2021-11-03: qty 1

## 2021-11-03 MED ORDER — MAGNESIUM HYDROXIDE 400 MG/5ML PO SUSP: 30.0000 mL | Freq: Every day | ORAL | Status: DC | PRN

## 2021-11-03 MED ORDER — DIAZEPAM 5 MG PO TABS: 10.0000 mg | ORAL_TABLET | Freq: Four times a day (QID) | ORAL | Status: DC | PRN

## 2021-11-03 MED ORDER — THIAMINE HCL 100 MG/ML IJ SOLN
100.0000 mg | Freq: Once | INTRAMUSCULAR | Status: AC
  Administered 2021-11-03: 100 mg via INTRAMUSCULAR
  Filled 2021-11-03: qty 2

## 2021-11-03 MED ORDER — ADULT MULTIVITAMIN W/MINERALS CH
1.0000 | ORAL_TABLET | Freq: Every day | ORAL | Status: DC
  Administered 2021-11-03: 1 via ORAL
  Filled 2021-11-03: qty 1

## 2021-11-03 NOTE — Discharge Instructions (Addendum)
Prescriptions sent to pharmacy at discharge. Patient agreeable to plan.  Outpatient substance abuse resources added to AVS. Given opportunity to ask questions.  Appears to feel comfortable with discharge denies any current suicidal or homicidal thought. Patient is also instructed prior to discharge to: Take all medications as prescribed by his/her mental healthcare provider. Report any adverse effects and or reactions from the medicines to his/her outpatient provider promptly. Patient has been instructed & cautioned: To not engage in alcohol and or illegal drug use while on prescription medicines. In the event of worsening symptoms, patient is instructed to call the crisis hotline, 911 and or go to the nearest ED for appropriate evaluation and treatment of symptoms. To follow-up with his/her primary care provider for your other medical issues, concerns and or health care needs.

## 2021-11-03 NOTE — ED Notes (Signed)
Patient verbalizes understanding of discharge instructions. Opportunity for questioning and answers were provided. Armband removed by staff, pt discharged from ED. Left for transfer to [facility] with all belongings in driver's possession.
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
## 2021-11-03 NOTE — ED Notes (Signed)
Pt is awake and alert. Has flat affect but good eye contact. Pt is redirectable.  Reports that his Left shoulder has a fx that causes him pain.   States he had thoughts of self harm but does not have a plan.  Pt's skin search was completed and no contraband found.  Pt was oriented to unit. Currently resting without problems  will continue to monitor.

## 2021-11-03 NOTE — ED Triage Notes (Signed)
Pt states high blood pressure and SI with plan. States has had a broken shoulder and has been told will need surgery but no one will help with it. States is tired of the pain.

## 2021-11-03 NOTE — ED Notes (Signed)
Telephone report given to [place], Therapist, sports at [facility]

## 2021-11-03 NOTE — ED Notes (Signed)
TTS in progress 
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
## 2021-11-03 NOTE — ED Provider Notes (Addendum)
[facility] Provider Note: Georgena Spurling, MD, FACEP  CSN: [id number] MRN: [id number] ARRIVAL: 11/03/21 at [place]: [place]  Suicidal   HISTORY OF PRESENT ILLNESS  11/03/21 3:18 AM Charles Graves is a 41 y.o. male with a nondisplaced greater tuberosity fracture of the left humerus that has partially healed and for which he states he require surgery due to intermittent paralysis of the left upper extremity.  He states he has been unable to get help and he is getting tired of the pain he describes as severe (9 out of 10).  He states he is unable to feel anything with his left arm and he is unable to move his left arm.  He is here with suicidal thoughts that have been present for months or years but have worsened recently due to the chronic pain associated with his left shoulder.  He has not worked out a Dance movement psychotherapist and decided to come to the hospital rather than do harm to himself.  He admits to drinking daily but states he does not drink excessively.   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 No pertinent surgical history.  History reviewed. No pertinent family history.  Social History   Tobacco Use   Smoking status: Every Day    Types: Cigarettes   Smokeless tobacco: Never  Vaping Use   Vaping Use: Never used  Substance Use Topics   Alcohol use: Yes   Drug use: Not Currently    Prior to Admission medications   Medication Sig Start Date End Date Taking? Authorizing Provider  amLODipine (NORVASC) 10 MG tablet Take 1 tablet (10 mg total) by mouth daily. 04/03/21   Shawna Clamp, MD  hydrALAZINE (APRESOLINE) 25 MG tablet Take 1 tablet (25 mg total) by mouth every 8 (eight) hours. 04/02/21   Shawna Clamp, MD  omeprazole (PRILOSEC) 20 MG capsule Take 20 mg by mouth daily. 08/16/21   [provider]   pantoprazole (PROTONIX) 40 MG tablet Take 1 tablet (40 mg total) by mouth daily. 04/03/21   Shawna Clamp, MD  traZODone (DESYREL) 50 MG tablet Take 50 mg by mouth at bedtime as needed. 08/10/21   [provider]    Allergies Lorazepam and Oxycodone hcl   REVIEW OF SYSTEMS  Negative except as noted here or in the History of Present Illness.   PHYSICAL EXAMINATION  Initial Vital Signs Blood pressure (!) 143/104, pulse (!) 106, temperature 98.6 F (37 C), temperature source Oral, resp. rate 18, height 5' 8.5" (1.74 m), weight 54.4 kg, SpO2 98 %.  Examination General: Well-developed, well-nourished male in no acute distress; appearance consistent with age of record HENT: normocephalic; atraumatic Eyes: pupils equal, round and reactive to light; extraocular muscles intact Neck: supple Heart: regular rate and rhythm Lungs: clear to auscultation bilaterally Abdomen: soft; nondistended; nontender; bowel sounds present Extremities: No deformity; paresis of left upper extremity Neurologic: Awake, alert and oriented; motor function intact in all extremities and symmetric; no facial droop Skin: Warm and dry Psychiatric: Depressed mood with congruent affect; SI   RESULTS  Summary of this visit's results, reviewed and interpreted by myself:   EKG Interpretation  Date/Time:    Ventricular Rate:    PR Interval:    QRS Duration:   QT Interval:    QTC  Calculation:   R Axis:     Text Interpretation:         Laboratory Studies: Results for orders placed or performed during the hospital encounter of 11/03/21 (from the past 24 hour(s))  Rapid urine drug screen (hospital performed)     Status: Abnormal   Collection Time: 11/03/21  1:08 AM  Result Value Ref Range   Opiates NONE DETECTED NONE DETECTED   Cocaine NONE DETECTED NONE DETECTED   Benzodiazepines POSITIVE (A) NONE DETECTED   Amphetamines NONE DETECTED NONE DETECTED   Tetrahydrocannabinol NONE DETECTED NONE  DETECTED   Barbiturates NONE DETECTED NONE DETECTED  Comprehensive metabolic panel     Status: Abnormal   Collection Time: 11/03/21  2:02 AM  Result Value Ref Range   Sodium 136 135 - 145 mmol/L   Potassium 3.7 3.5 - 5.1 mmol/L   Chloride 100 98 - 111 mmol/L   CO2 25 22 - 32 mmol/L   Glucose, Bld 108 (H) 70 - 99 mg/dL   BUN 8 6 - 20 mg/dL   Creatinine, Ser 0.70 0.61 - 1.24 mg/dL   Calcium 8.8 (L) 8.9 - 10.3 mg/dL   Total Protein 7.1 6.5 - 8.1 g/dL   Albumin 4.1 3.5 - 5.0 g/dL   AST 19 15 - 41 U/L   ALT 18 0 - 44 U/L   Alkaline Phosphatase 73 38 - 126 U/L   Total Bilirubin 0.6 0.3 - 1.2 mg/dL   GFR, Estimated >60 >60 mL/min   Anion gap 11 5 - 15  Ethanol     Status: Abnormal   Collection Time: 11/03/21  2:02 AM  Result Value Ref Range   Alcohol, Ethyl (B) 293 (H) <48 mg/dL  Salicylate level     Status: Abnormal   Collection Time: 11/03/21  2:02 AM  Result Value Ref Range   Salicylate Lvl <1.8 (L) 7.0 - 30.0 mg/dL  Acetaminophen level     Status: Abnormal   Collection Time: 11/03/21  2:02 AM  Result Value Ref Range   Acetaminophen (Tylenol), Serum <10 (L) 10 - 30 ug/mL  cbc     Status: None   Collection Time: 11/03/21  2:02 AM  Result Value Ref Range   WBC 4.8 4.0 - 10.5 K/uL   RBC 4.70 4.22 - 5.81 MIL/uL   Hemoglobin 15.2 13.0 - 17.0 g/dL   HCT 45.1 39.0 - 52.0 %   MCV 96.0 80.0 - 100.0 fL   MCH 32.3 26.0 - 34.0 pg   MCHC 33.7 30.0 - 36.0 g/dL   RDW 14.6 11.5 - 15.5 %   Platelets 292 150 - 400 K/uL   nRBC 0.0 0.0 - 0.2 %  Resp Panel by RT-PCR (Flu A&B, Covid) Nasopharyngeal Swab     Status: None   Collection Time: 11/03/21  3:47 AM   Specimen: Nasopharyngeal Swab; Nasopharyngeal(NP) swabs in vial transport medium  Result Value Ref Range   SARS Coronavirus 2 by RT PCR NEGATIVE NEGATIVE   Influenza A by PCR NEGATIVE NEGATIVE   Influenza B by PCR NEGATIVE NEGATIVE   Imaging Studies: No results found.  ED COURSE and MDM  Nursing notes, initial and subsequent  vitals signs, including pulse oximetry, reviewed and interpreted by myself.  Vitals:   11/03/21 0055 11/03/21 0058  BP: (!) 143/104   Pulse: (!) 106   Resp: 18   Temp: 98.6 F (37 C)   TempSrc: Oral   SpO2: 98%   Weight:  54.4 kg  Height:  5' 8.5" (1.74 m)   Medications  nicotine (NICODERM CQ - dosed in mg/24 hours) patch 21 mg (has no administration in time range)  diazepam (VALIUM) tablet 10 mg (has no administration in time range)  diazepam (VALIUM) tablet 5 mg (5 mg Oral Given 11/03/21 0352)    We will have TTS assess patient.  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  This is subacute as his injury occurred in December and the symptoms have been ongoing.  5:40 AM She is assessed and accepted for transfer to [facility].  PROCEDURES  Procedures   ED DIAGNOSES     ICD-10-CM   1. Suicidal ideation  R45.851     2. Alcoholic intoxication without complication ([place])  O16.073     3. Nerve damage  T14.8XXA          Cordney Barstow, Jenny Reichmann, MD 11/03/21 7106    Shanon Rosser, MD 11/03/21 [phone]
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
## 2021-11-17 ENCOUNTER — Other Ambulatory Visit: Payer: Self-pay

## 2021-11-17 ENCOUNTER — Emergency Department (HOSPITAL_BASED_OUTPATIENT_CLINIC_OR_DEPARTMENT_OTHER)
Admission: EM | Admit: 2021-11-17 | Discharge: 2021-11-17 | Disposition: A | Discharge status: Home / Self Care | Payer: PRIVATE HEALTH INSURANCE | Attending: Emergency Medicine | Admitting: Emergency Medicine

## 2021-11-17 ENCOUNTER — Encounter (HOSPITAL_BASED_OUTPATIENT_CLINIC_OR_DEPARTMENT_OTHER): Payer: Self-pay

## 2021-11-17 ENCOUNTER — Emergency Department (HOSPITAL_BASED_OUTPATIENT_CLINIC_OR_DEPARTMENT_OTHER): Payer: PRIVATE HEALTH INSURANCE

## 2021-11-17 DIAGNOSIS — R55 Syncope and collapse: Secondary | ICD-10-CM | POA: Insufficient documentation

## 2021-11-17 DIAGNOSIS — R410 Disorientation, unspecified: Secondary | ICD-10-CM | POA: Insufficient documentation

## 2021-11-17 DIAGNOSIS — Z79899 Other long term (current) drug therapy: Secondary | ICD-10-CM | POA: Insufficient documentation

## 2021-11-17 DIAGNOSIS — W19XXXA Unspecified fall, initial encounter: Secondary | ICD-10-CM | POA: Insufficient documentation

## 2021-11-17 LAB — CBC WITH DIFFERENTIAL/PLATELET
Abs Immature Granulocytes: 0.01 10*3/uL (ref 0.00–0.07)
Basophils Absolute: 0 10*3/uL (ref 0.0–0.1)
Basophils Relative: 1 %
Eosinophils Absolute: 0.1 10*3/uL (ref 0.0–0.5)
Eosinophils Relative: 2 %
HCT: 49.8 % (ref 39.0–52.0)
Hemoglobin: 16.7 g/dL (ref 13.0–17.0)
Immature Granulocytes: 0 %
Lymphocytes Relative: 34 %
Lymphs Abs: 2 10*3/uL (ref 0.7–4.0)
MCH: 31.9 pg (ref 26.0–34.0)
MCHC: 33.5 g/dL (ref 30.0–36.0)
MCV: 95 fL (ref 80.0–100.0)
Monocytes Absolute: 0.4 10*3/uL (ref 0.1–1.0)
Monocytes Relative: 7 %
Neutro Abs: 3.5 10*3/uL (ref 1.7–7.7)
Neutrophils Relative %: 56 %
Platelets: 154 10*3/uL (ref 150–400)
RBC: 5.24 MIL/uL (ref 4.22–5.81)
RDW: 14.6 % (ref 11.5–15.5)
WBC: 6.1 10*3/uL (ref 4.0–10.5)
nRBC: 0 % (ref 0.0–0.2)

## 2021-11-17 LAB — COMPREHENSIVE METABOLIC PANEL
ALT: 15 U/L (ref 0–44)
AST: 24 U/L (ref 15–41)
Albumin: 4.5 g/dL (ref 3.5–5.0)
Alkaline Phosphatase: 86 U/L (ref 38–126)
Anion gap: 13 (ref 5–15)
BUN: 7 mg/dL (ref 6–20)
CO2: 27 mmol/L (ref 22–32)
Calcium: 8.8 mg/dL — ABNORMAL LOW (ref 8.9–10.3)
Chloride: 102 mmol/L (ref 98–111)
Creatinine, Ser: 0.84 mg/dL (ref 0.61–1.24)
GFR, Estimated: >60 mL/min (ref >60)
Glucose, Bld: 92 mg/dL (ref 70–99)
Potassium: 3.4 mmol/L — ABNORMAL LOW (ref 3.5–5.1)
Sodium: 142 mmol/L (ref 135–145)
Total Bilirubin: 1 mg/dL (ref 0.3–1.2)
Total Protein: 7.3 g/dL (ref 6.5–8.1)

## 2021-11-17 LAB — ETHANOL: Alcohol, Ethyl (B): 329 mg/dL (ref <10)

## 2021-11-17 MED ORDER — SODIUM CHLORIDE 0.9 % IV BOLUS
1000.0000 mL | Freq: Once | INTRAVENOUS | Status: AC
  Administered 2021-11-17: 11:00:00 1000 mL via INTRAVENOUS

## 2021-11-17 NOTE — ED Triage Notes (Signed)
[facility] reports pt coming from hotel that he is staying at. Pt states he passed out when coming from out of the bathroom. Pt hit his head on the left area. ?

## 2021-11-17 NOTE — ED Provider Notes (Signed)
[facility] EMERGENCY DEPARTMENT Provider Note   CSN: [id number] Arrival date & time: 11/17/21  1046     History  Chief Complaint  Patient presents with   Loss of Consciousness    Charles Graves is a 41 y.o. male.  Patient here after syncopal type event.  States that he was in his hotel room when he passed out.  He denies any chest pain or shortness of breath.  He states he thinks that he has a history of seizures.  He thinks he hit his head when he fell.  He feels better now.  Came here with EMS.  The history is provided by the patient.  Loss of Consciousness Episode history:  Single Most recent episode:  Today Progression:  Resolved Chronicity:  New Context: standing up   Witnessed: no   Relieved by:  Nothing Worsened by:  Nothing Associated symptoms: confusion   Associated symptoms: no anxiety, no chest pain, no diaphoresis, no difficulty breathing, no dizziness, no fever, no focal sensory loss, no focal weakness, no headaches, no malaise/fatigue, no nausea, no palpitations, no recent fall, no recent injury, no recent surgery, no rectal bleeding, no seizures, no shortness of breath, no visual change, no vomiting and no weakness       Home Medications Prior to Admission medications   Medication Sig Start Date End Date Taking? Authorizing Provider  amLODipine (NORVASC) 10 MG tablet Take 1 tablet (10 mg total) by mouth daily. 04/03/21   Shawna Clamp, MD  Cholecalciferol 25 MCG (1000 UT) tablet Take 1,000 Units by mouth daily. 10/26/21   [provider]  folic acid (FOLVITE) 1 MG tablet Take 1 tablet (1 mg total) by mouth daily. 11/04/21   Armando Reichert, MD  gabapentin (NEURONTIN) 300 MG capsule Take 300 mg by mouth 3 (three) times daily. 10/26/21   [provider]  hydrALAZINE (APRESOLINE) 25 MG tablet Take 1 tablet (25 mg total) by mouth every 8 (eight) hours. Patient taking differently: Take 25 mg by mouth 2 (two) times daily. 04/02/21   Shawna Clamp, MD  methocarbamol (ROBAXIN) 500 MG tablet Take 500 mg by mouth 2 (two) times daily. 10/26/21   [provider]  mirtazapine (REMERON) 15 MG tablet Take 15 mg by mouth at bedtime. 10/26/21   [provider]  Multiple Vitamin (MULTIVITAMIN WITH MINERALS) TABS tablet Take 1 tablet by mouth daily.    [provider]  nicotine (NICODERM CQ - DOSED IN MG/24 HOURS) 21 mg/24hr patch Place 21 mg onto the skin daily. 09/27/21   [provider]  pantoprazole (PROTONIX) 40 MG tablet Take 1 tablet (40 mg total) by mouth daily. 04/03/21   Shawna Clamp, MD  thiamine 100 MG tablet Take 1 tablet (100 mg total) by mouth daily. 11/04/21   Armando Reichert, MD  traZODone (DESYREL) 50 MG tablet Take 50 mg by mouth at bedtime as needed for sleep. 08/10/21   [provider]      Allergies    Lorazepam and Oxycodone hcl    Review of Systems   Review of Systems  Constitutional:  Negative for diaphoresis, fever and malaise/fatigue.  Respiratory:  Negative for shortness of breath.   Cardiovascular:  Positive for syncope. Negative for chest pain and palpitations.  Gastrointestinal:  Negative for nausea and vomiting.  Neurological:  Negative for dizziness, focal weakness, seizures, weakness and headaches.  Psychiatric/Behavioral:  Positive for confusion.    Physical Exam Updated Vital Signs BP (!) 155/107    Pulse  89    Resp 18    Ht '5\' 8"'$  (1.727 m)    Wt 59 kg    SpO2 96%    BMI 19.77 kg/m  Physical Exam Vitals and nursing note reviewed.  Constitutional:      General: He is not in acute distress.    Appearance: He is well-developed. He is not ill-appearing.  HENT:     Head: Normocephalic and atraumatic.     Nose: Nose normal.     Mouth/Throat:     Mouth: Mucous membranes are moist.  Eyes:     Extraocular Movements: Extraocular movements intact.     Conjunctiva/sclera: Conjunctivae normal.     Pupils: Pupils are equal, round, and reactive to light.   Cardiovascular:     Rate and Rhythm: Normal rate and regular rhythm.     Pulses: Normal pulses.     Heart sounds: Normal heart sounds. No murmur heard. Pulmonary:     Effort: Pulmonary effort is normal. No respiratory distress.     Breath sounds: Normal breath sounds.  Abdominal:     Palpations: Abdomen is soft.     Tenderness: There is no abdominal tenderness.  Musculoskeletal:        General: No swelling.     Cervical back: Normal range of motion and neck supple.  Skin:    General: Skin is warm and dry.     Capillary Refill: Capillary refill takes less than 2 seconds.  Neurological:     General: No focal deficit present.     Mental Status: He is alert.     Comments: Appears to have normal strength and sensation, normal speech, no drift, normal visual fields  Psychiatric:        Mood and Affect: Mood normal.    ED Results / Procedures / Treatments   Labs (all labs ordered are listed, but only abnormal results are displayed) Labs Reviewed  COMPREHENSIVE METABOLIC PANEL - Abnormal; Notable for the following components:      Result Value   Potassium 3.4 (*)    Calcium 8.8 (*)    All other components within normal limits  CBC WITH DIFFERENTIAL/PLATELET  ETHANOL    EKG EKG Interpretation  Date/Time:  Thursday November 17 2021 10:53:03 EST Ventricular Rate:  97 PR Interval:  177 QRS Duration: 109 QT Interval:  358 QTC Calculation: 455 R Axis:   51 Text Interpretation: Sinus rhythm Confirmed by Lennice Sites ([id number]) on 11/17/2021 10:57:59 AM  Radiology CT Head Wo Contrast  Result Date: 11/17/2021 CLINICAL DATA:  Head trauma, moderate-severe; seizure, fall EXAM: CT HEAD WITHOUT CONTRAST TECHNIQUE: Contiguous axial images were obtained from the base of the skull through the vertex without intravenous contrast. RADIATION DOSE REDUCTION: This exam was performed according to the departmental dose-optimization program which includes automated exposure control, adjustment of the mA  and/or kV according to patient size and/or use of iterative reconstruction technique. COMPARISON:  08/25/2021 FINDINGS: Brain: There is no acute intracranial hemorrhage, mass effect, or edema. Gray-white differentiation is preserved. There is no extra-axial fluid collection. Ventricles and sulci are stable in size and configuration. Vascular: No hyperdense vessel or unexpected calcification. Skull: Calvarium is unremarkable. Sinuses/Orbits: No acute finding. Other: Mastoid air cells are clear. Soft tissue within the external auditory canal bilaterally presumably reflects cerumen. IMPRESSION: No acute intracranial abnormality. Electronically Signed   By: Macy Mis M.D.   On: 11/17/2021 11:31    Procedures Procedures    Medications Ordered in ED Medications  sodium  chloride 0.9 % bolus 1,000 mL (1,000 mLs Intravenous New Bag/Given 11/17/21 1103)    ED Course/ Medical Decision Making/ A&P                           Medical Decision Making Amount and/or Complexity of Data Reviewed Labs: ordered. Radiology: ordered.   Margarita Bobrowski is here after a suppose a syncopal event.  Normal vitals.  No fever.  Overall well-appearing.  Normal neurological exam.  Per chart review he has history significant for alcohol abuse, malingering, mental health issues.  States that he was at a hotel room when this happened and was not witnessed.  He states he has a history of seizures but do not see that in his medical chart.  Overall he appears well.  He states that he thinks he hit his head when he fell.  We will check basic labs, head CT.  Overall suspect dehydration versus head trauma versus less likely seizure.  Not having any chest pain or shortness of breath.  EKG per my review and interpretation shows sinus rhythm.  No ischemic changes.  Will give fluid bolus.  Check CBC, BMP, alcohol level.  Admits his last alcoholic beverage was this morning.  Less likely that this is alcohol withdrawal.  Per my review and  interpretation of labs, there is no significant anemia or electrolyte abnormality.  CT scan per radiology states no acute findings.  Patient's able throughout my care.  Episode likely in the setting of dehydration and alcohol use.  He is clinically sober.  Discharged in good condition.  This chart was dictated using voice recognition software.  Despite best efforts to proofread,  errors can occur which can change the documentation meaning.         Final Clinical Impression(s) / ED Diagnoses Final diagnoses:  Fall, initial encounter    Rx / DC Orders ED Discharge Orders     None         Lennice Sites, DO 11/17/21 1155

## 2021-11-18 ENCOUNTER — Emergency Department (HOSPITAL_BASED_OUTPATIENT_CLINIC_OR_DEPARTMENT_OTHER)
Admission: EM | Admit: 2021-11-18 | Discharge: 2021-11-18 | Disposition: A | Discharge status: Other Acute Inpatient Hospital | Payer: Medicaid - Out of State | Attending: Emergency Medicine | Admitting: Emergency Medicine

## 2021-11-18 ENCOUNTER — Ambulatory Visit (HOSPITAL_COMMUNITY)
Admission: EM | Admit: 2021-11-18 | Discharge: 2021-11-19 | Disposition: A | Discharge status: Home / Self Care | Payer: Medicaid - Out of State | Source: Home / Self Care

## 2021-11-18 ENCOUNTER — Encounter (HOSPITAL_BASED_OUTPATIENT_CLINIC_OR_DEPARTMENT_OTHER): Payer: Self-pay | Admitting: Emergency Medicine

## 2021-11-18 DIAGNOSIS — Z20822 Contact with and (suspected) exposure to covid-19: Secondary | ICD-10-CM | POA: Insufficient documentation

## 2021-11-18 DIAGNOSIS — Z5900 Homelessness unspecified: Secondary | ICD-10-CM | POA: Insufficient documentation

## 2021-11-18 DIAGNOSIS — Z79899 Other long term (current) drug therapy: Secondary | ICD-10-CM | POA: Diagnosis not present

## 2021-11-18 DIAGNOSIS — F332 Major depressive disorder, recurrent severe without psychotic features: Secondary | ICD-10-CM | POA: Insufficient documentation

## 2021-11-18 DIAGNOSIS — F4310 Post-traumatic stress disorder, unspecified: Secondary | ICD-10-CM | POA: Insufficient documentation

## 2021-11-18 DIAGNOSIS — F331 Major depressive disorder, recurrent, moderate: Secondary | ICD-10-CM | POA: Insufficient documentation

## 2021-11-18 DIAGNOSIS — R45851 Suicidal ideations: Secondary | ICD-10-CM | POA: Insufficient documentation

## 2021-11-18 DIAGNOSIS — Z046 Encounter for general psychiatric examination, requested by authority: Secondary | ICD-10-CM | POA: Diagnosis present

## 2021-11-18 DIAGNOSIS — F1010 Alcohol abuse, uncomplicated: Secondary | ICD-10-CM | POA: Diagnosis not present

## 2021-11-18 LAB — CBC WITH DIFFERENTIAL/PLATELET
Abs Immature Granulocytes: 0.02 10*3/uL (ref 0.00–0.07)
Basophils Absolute: 0 10*3/uL (ref 0.0–0.1)
Basophils Relative: 0 %
Eosinophils Absolute: 0 10*3/uL (ref 0.0–0.5)
Eosinophils Relative: 0 %
HCT: 49.4 % (ref 39.0–52.0)
Hemoglobin: 16.9 g/dL (ref 13.0–17.0)
Immature Granulocytes: 0 %
Lymphocytes Relative: 25 %
Lymphs Abs: 2 10*3/uL (ref 0.7–4.0)
MCH: 32.4 pg (ref 26.0–34.0)
MCHC: 34.2 g/dL (ref 30.0–36.0)
MCV: 94.8 fL (ref 80.0–100.0)
Monocytes Absolute: 0.4 10*3/uL (ref 0.1–1.0)
Monocytes Relative: 6 %
Neutro Abs: 5.2 10*3/uL (ref 1.7–7.7)
Neutrophils Relative %: 69 %
Platelets: 160 10*3/uL (ref 150–400)
RBC: 5.21 MIL/uL (ref 4.22–5.81)
RDW: 14.7 % (ref 11.5–15.5)
WBC: 7.7 10*3/uL (ref 4.0–10.5)
nRBC: 0 % (ref 0.0–0.2)

## 2021-11-18 LAB — COMPREHENSIVE METABOLIC PANEL
ALT: 20 U/L (ref 0–44)
AST: 32 U/L (ref 15–41)
Albumin: 5 g/dL (ref 3.5–5.0)
Alkaline Phosphatase: 91 U/L (ref 38–126)
Anion gap: 17 — ABNORMAL HIGH (ref 5–15)
BUN: 10 mg/dL (ref 6–20)
CO2: 23 mmol/L (ref 22–32)
Calcium: 8.7 mg/dL — ABNORMAL LOW (ref 8.9–10.3)
Chloride: 99 mmol/L (ref 98–111)
Creatinine, Ser: 0.72 mg/dL (ref 0.61–1.24)
GFR, Estimated: >60 mL/min (ref >60)
Glucose, Bld: 90 mg/dL (ref 70–99)
Potassium: 3.4 mmol/L — ABNORMAL LOW (ref 3.5–5.1)
Sodium: 139 mmol/L (ref 135–145)
Total Bilirubin: 1.7 mg/dL — ABNORMAL HIGH (ref 0.3–1.2)
Total Protein: 8.3 g/dL — ABNORMAL HIGH (ref 6.5–8.1)

## 2021-11-18 LAB — RESP PANEL BY RT-PCR (FLU A&B, COVID) ARPGX2
Influenza A by PCR: NEGATIVE
Influenza B by PCR: NEGATIVE
SARS Coronavirus 2 by RT PCR: NEGATIVE

## 2021-11-18 LAB — RAPID URINE DRUG SCREEN, HOSP PERFORMED
Amphetamines: NOT DETECTED
Barbiturates: NOT DETECTED
Benzodiazepines: POSITIVE — AB
Cocaine: NOT DETECTED
Opiates: NOT DETECTED
Tetrahydrocannabinol: NOT DETECTED

## 2021-11-18 LAB — ETHANOL: Alcohol, Ethyl (B): 360 mg/dL (ref <10)

## 2021-11-18 MED ORDER — ADULT MULTIVITAMIN W/MINERALS CH
1.0000 | ORAL_TABLET | Freq: Every day | ORAL | Status: DC
  Administered 2021-11-18 – 2021-11-19 (×2): 1 via ORAL
  Filled 2021-11-18 (×2): qty 1

## 2021-11-18 MED ORDER — HYDROXYZINE HCL 25 MG PO TABS: 25.0000 mg | ORAL_TABLET | Freq: Four times a day (QID) | ORAL | Status: DC | PRN

## 2021-11-18 MED ORDER — LORAZEPAM 2 MG/ML IJ SOLN
INTRAMUSCULAR | Status: AC
  Filled 2021-11-18: qty 1

## 2021-11-18 MED ORDER — THIAMINE HCL 100 MG/ML IJ SOLN
100.0000 mg | Freq: Once | INTRAMUSCULAR | Status: AC
  Administered 2021-11-18: 100 mg via INTRAMUSCULAR
  Filled 2021-11-18: qty 2

## 2021-11-18 MED ORDER — LORAZEPAM 2 MG/ML IJ SOLN
1.0000 mg | Freq: Once | INTRAMUSCULAR | Status: AC
  Administered 2021-11-18: 1 mg via INTRAMUSCULAR

## 2021-11-18 MED ORDER — LORAZEPAM 1 MG PO TABS: 1.0000 mg | ORAL_TABLET | Freq: Three times a day (TID) | ORAL | Status: DC

## 2021-11-18 MED ORDER — AMLODIPINE BESYLATE 5 MG PO TABS
5.0000 mg | ORAL_TABLET | Freq: Once | ORAL | Status: DC
  Filled 2021-11-18: qty 1

## 2021-11-18 MED ORDER — ALUM & MAG HYDROXIDE-SIMETH 200-200-20 MG/5ML PO SUSP: 30.0000 mL | ORAL | Status: DC | PRN

## 2021-11-18 MED ORDER — HYDRALAZINE HCL 25 MG PO TABS
25.0000 mg | ORAL_TABLET | Freq: Once | ORAL | Status: AC
  Administered 2021-11-18: 25 mg via ORAL
  Filled 2021-11-18: qty 1

## 2021-11-18 MED ORDER — ONDANSETRON 4 MG PO TBDP
4.0000 mg | ORAL_TABLET | Freq: Four times a day (QID) | ORAL | Status: DC | PRN
  Administered 2021-11-18: 4 mg via ORAL
  Filled 2021-11-18: qty 1

## 2021-11-18 MED ORDER — LORAZEPAM 1 MG PO TABS
1.0000 mg | ORAL_TABLET | Freq: Four times a day (QID) | ORAL | Status: DC
  Administered 2021-11-18 – 2021-11-19 (×4): 1 mg via ORAL
  Filled 2021-11-18 (×5): qty 1

## 2021-11-18 MED ORDER — LORAZEPAM 1 MG PO TABS: 1.0000 mg | ORAL_TABLET | Freq: Two times a day (BID) | ORAL | Status: DC

## 2021-11-18 MED ORDER — THIAMINE HCL 100 MG PO TABS
100.0000 mg | ORAL_TABLET | Freq: Every day | ORAL | Status: DC
  Administered 2021-11-19: 100 mg via ORAL
  Filled 2021-11-18: qty 1

## 2021-11-18 MED ORDER — AMLODIPINE BESYLATE 5 MG PO TABS
10.0000 mg | ORAL_TABLET | Freq: Once | ORAL | Status: AC
  Administered 2021-11-18: 10 mg via ORAL
  Filled 2021-11-18: qty 2

## 2021-11-18 MED ORDER — LORAZEPAM 1 MG PO TABS: 1.0000 mg | ORAL_TABLET | Freq: Every day | ORAL | Status: DC

## 2021-11-18 MED ORDER — ACETAMINOPHEN 325 MG PO TABS: 650.0000 mg | ORAL_TABLET | Freq: Four times a day (QID) | ORAL | Status: DC | PRN

## 2021-11-18 MED ORDER — MAGNESIUM HYDROXIDE 400 MG/5ML PO SUSP: 30.0000 mL | Freq: Every day | ORAL | Status: DC | PRN

## 2021-11-18 MED ORDER — LOPERAMIDE HCL 2 MG PO CAPS: 2.0000 mg | ORAL_CAPSULE | ORAL | Status: DC | PRN

## 2021-11-18 MED ORDER — LORAZEPAM 1 MG PO TABS: 1.0000 mg | ORAL_TABLET | Freq: Four times a day (QID) | ORAL | Status: DC | PRN

## 2021-11-18 NOTE — ED Notes (Signed)
Reached out to behavioral health via secure chat regarding update vital signs and BP medications administered. No response at this time ?

## 2021-11-18 NOTE — ED Provider Notes (Signed)
?  Physical Exam  ?BP (!) 163/115   Pulse 98   Temp 98.2 ?F (36.8 ?C) (Oral)   Resp 20   Ht '5\' 8"'$  (1.727 m)   Wt 59 kg   SpO2 100%   BMI 19.77 kg/m?  ? ?Physical Exam ? ?Procedures  ?Procedures ? ?ED Course / MDM  ? ?Clinical Course as of 11/18/21 0900  ?Fri Nov 18, 2021  ?0525 CBC is normal.  [CS]  ?7846 UDS positive for benzos.  [CS]  ?9629 CMP is unremarkable. EtOH is elevated similar to previous, patient is awake and alert.  [CS]  ?5284 Patient is medically clear for TTS evaluation.  [CS]  ?0707 Care of the patient signed out to the ongoing shift.  [CS]  ?  ?Clinical Course User Index ?[CS] Truddie Hidden, MD  ? ?Medical Decision Making ?Amount and/or Complexity of Data Reviewed ?Labs: ordered. ? ? ?Patient stated he was suicidal.  Intoxicated.  TTS has checked on patient states that his alcohol level is too high for evaluation.  Did not give a number that they would like.  Will check back later. ? ? ? ? ?  ?Davonna Belling, MD ?11/18/21 0900 ? ?

## 2021-11-18 NOTE — ED Notes (Addendum)
Vital signs updated on pt, offered food and drink but doesn't want anything right now.  ?

## 2021-11-18 NOTE — ED Notes (Signed)
TTS in progress 

## 2021-11-18 NOTE — ED Triage Notes (Signed)
Pt states "I want to be dead" states was brought in by EMS earlier due to not feeling right. Was evaluated and discharged and told to get rest.  ?

## 2021-11-18 NOTE — BH Assessment (Signed)
Called to find out the pt's BAL level to see if he was sober enough for TTS tele-assessment. Per RN Shirlean Mylar, she estimated pt's BAL may have dropped about 100 points which would leave him at about 260. Advised RN I would give more time for BAL to come down and we would check back for tele-assessment.  ? ?Luci Bellucci T. Mare Ferrari, [place], [facility], [place] ?Triage Specialist ?[place] ? ?

## 2021-11-18 NOTE — ED Provider Notes (Signed)
? ?[facility] EMERGENCY DEPARTMENT  ?Provider Note ? ?CSN: [id number] ?Arrival date & time: 11/18/21 0420 ? ?History ?Chief Complaint  ?Patient presents with  ? Suicidal  ? ? ?Charles Graves is a 41 y.o. male with history of alcohol abuse, chronic pain, and suicidal ideation was seen yesterday morning after having a syncopal vs seizure episode at the hotel where he is currently living. He had a negative ED workup and was ultimately discharged. He reports after returning to his hotel he fell asleep, woke up from a nightmare around 4pm and was 'done with this', meaning he wished to be dead. He went back asleep and woke up a few hours later with similar thoughts. He was unable to fall back asleep and so decided to return for evaluation of suicidal thoughts with a vague plan to cut himself with a box cutter or walking into traffic. Admits to EtOH use, denies drug use.  ? ? ?Home Medications ?Prior to Admission medications   ?Medication Sig Start Date End Date Taking? Authorizing Provider  ?amLODipine (NORVASC) 10 MG tablet Take 1 tablet (10 mg total) by mouth daily. 04/03/21   Shawna Clamp, MD  ?Cholecalciferol 25 MCG (1000 UT) tablet Take 1,000 Units by mouth daily. 10/26/21   [provider]  ?folic acid (FOLVITE) 1 MG tablet Take 1 tablet (1 mg total) by mouth daily. 11/04/21   Armando Reichert, MD  ?gabapentin (NEURONTIN) 300 MG capsule Take 300 mg by mouth 3 (three) times daily. 10/26/21   [provider]  ?hydrALAZINE (APRESOLINE) 25 MG tablet Take 1 tablet (25 mg total) by mouth every 8 (eight) hours. ?Patient taking differently: Take 25 mg by mouth 2 (two) times daily. 04/02/21   Shawna Clamp, MD  ?methocarbamol (ROBAXIN) 500 MG tablet Take 500 mg by mouth 2 (two) times daily. 10/26/21   [provider]  ?mirtazapine (REMERON) 15 MG tablet Take 15 mg by mouth at bedtime. 10/26/21   [provider]  ?Multiple Vitamin (MULTIVITAMIN WITH MINERALS) TABS tablet Take 1 tablet by  mouth daily.    [provider]  ?nicotine (NICODERM CQ - DOSED IN MG/24 HOURS) 21 mg/24hr patch Place 21 mg onto the skin daily. 09/27/21   [provider]  ?pantoprazole (PROTONIX) 40 MG tablet Take 1 tablet (40 mg total) by mouth daily. 04/03/21   Shawna Clamp, MD  ?thiamine 100 MG tablet Take 1 tablet (100 mg total) by mouth daily. 11/04/21   Armando Reichert, MD  ?traZODone (DESYREL) 50 MG tablet Take 50 mg by mouth at bedtime as needed for sleep. 08/10/21   [provider]  ? ? ? ?Allergies    ?Lorazepam and Oxycodone hcl ? ? ?Review of Systems   ?Review of Systems ?Please see HPI for pertinent positives and negatives ? ?Physical Exam ?Ht '5\' 8"'$  (1.727 m)   Wt 59 kg   BMI 19.77 kg/m?  ? ?Physical Exam ?Vitals and nursing note reviewed.  ?HENT:  ?   Head: Normocephalic.  ?   Nose: Nose normal.  ?Eyes:  ?   Extraocular Movements: Extraocular movements intact.  ?Pulmonary:  ?   Effort: Pulmonary effort is normal.  ?Musculoskeletal:     ?   General: Normal range of motion.  ?   Cervical back: Neck supple.  ?Skin: ?   Findings: No rash (on exposed skin).  ?Neurological:  ?   Mental Status: He is alert and oriented to person, place, and time.  ?Psychiatric:     ?  Mood and Affect: Mood normal.  ? ? ?ED Results / Procedures / Treatments   ?EKG ?None ? ?Procedures ?Procedures ? ?Medications Ordered in the ED ?Medications - No data to display ? ?Initial Impression and Plan ? Patient with reported SI, will check labs and consult TTS.  ? ?ED Course  ? ?Clinical Course as of 11/18/21 2257  ?Fri Nov 18, 2021  ?0525 CBC is normal.  [CS]  ?6761 UDS positive for benzos.  [CS]  ?9509 CMP is unremarkable. EtOH is elevated similar to previous, patient is awake and alert.  [CS]  ?3267 Patient is medically clear for TTS evaluation.  [CS]  ?0707 Care of the patient signed out to the ongoing shift.  [CS]  ?  ?Clinical Course User Index ?[CS] Truddie Hidden, MD  ? ? ? ?MDM Rules/Calculators/A&P ?Medical  Decision Making ?Amount and/or Complexity of Data Reviewed ?Labs: ordered. ? ? ? ?Final Clinical Impression(s) / ED Diagnoses ?Final diagnoses:  ?None  ? ? ?Rx / DC Orders ?ED Discharge Orders   ? ? None  ? ?  ? ?  ?Truddie Hidden, MD ?11/18/21 2258 ? ?

## 2021-11-18 NOTE — ED Notes (Signed)
Safe hands notified and report given to Randall Hiss at [facility] ?

## 2021-11-18 NOTE — ED Notes (Signed)
TTS completed. 

## 2021-11-18 NOTE — ED Notes (Signed)
Pt asleep in bed. Respirations even and unlabored. Will continue to monitor for safety. ?
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
## 2021-11-18 NOTE — ED Notes (Addendum)
Sandwich and juice given per pt request. Pt currently sitting in bed eating. No signs of acute distress noted. Will continue to monitor for safety. ?

## 2021-11-18 NOTE — ED Provider Notes (Signed)
Behavioral Health Admission H&P [facility] & OBS)  Date: 11/18/21 Patient Name: Charles Graves MRN: [id number] Chief Complaint: No chief complaint on file.     Diagnoses:  Final diagnoses:  Alcohol abuse  Suicidal ideation    HPI: Charles Graves is a 41 year old male who presented to the emergency department earlier this date with complaints of suicidal ideation.  Patient arrives to [facility] behavioral health for continued treatment and stabilization, he remains voluntary at this time.  Patient reports ongoing suicidal ideation.  Today he endorses plan to run into traffic or cut wrists.  He denies intent to act on this plan.  Patient reports he would like to be placed in residential substance use treatment to address his ongoing alcohol use disorder.  Patient endorses daily alcohol use x1 year.  He reports he typically consumes between 1 to 2 pints of liquor daily.  He denies substance use aside from alcohol.  He denies any history of substance use treatment.  Charles Graves reports he has been diagnosed with depression, PTSD and bipolar disorder.  He is not linked with outpatient psychiatry currently.  He states "I usually get my medication from the emergency departments."  He reports he is compliant with trazodone, reports average sleep with trazodone.  He is not compliant with medications aside from trazodone.  No family mental health history reported.  Charles Graves denies any history of suicide attempts, he also denies history of nonsuicidal self-harm behavior.  He denies homicidal ideation.  Patient is assessed face-to-face by nurse practitioner.  He is seated in assessment area.  He is alert and oriented, pleasant and cooperative during assessment.  He presents with depressed mood, congruent affect. He has normal speech and behavior.  He denies both auditory and visual hallucinations currently.  He endorses visual hallucinations earlier this date, states "I could see people."   Patient is able to converse  coherently with goal-directed thoughts and no distractibility or preoccupation.  He denies paranoia.  Objectively there is no evidence of psychosis/mania or delusional thinking.  Charles Graves is currently homeless and has most recently resided in a hotel in [place].  He reports ongoing homelessness for 1 year.  He is employed in the Production [employer], theatre/television/film.  Patient endorses average sleep and decreased appetite.  Patient reports decreased appetite when using alcohol.  Patient offered support and encouragement.  He declines any person to contact for collateral information at this time.    PHQ 2-9:  Flowsheet Row ED from 11/18/2021 in [place]  Thoughts that you would be better off dead, or of hurting yourself in some way Several days  PHQ-9 Total Score 10       Flowsheet Row ED from 11/18/2021 in [place] ED from 11/17/2021 in [place] ED from 11/03/2021 in [place] CATEGORY High Risk Low Risk High Risk        Total Time spent with patient: 30 minutes  Musculoskeletal  Strength & Muscle Tone: within normal limits Gait & Station: normal Patient leans: N/A  Psychiatric Specialty Exam  Presentation General Appearance: Casual  Eye Contact:Absent  Speech:Clear and Coherent; Normal Rate  Speech Volume:Normal  Handedness:Right   Mood and Affect  Mood:Depressed  Affect:Depressed   Thought Process  Thought Processes:Coherent; Goal Directed; Linear  Descriptions of Associations:Intact  Orientation:Full (Time, Place and Person)  Thought Content:Logical; WDL  Diagnosis of Schizophrenia or Schizoaffective disorder in past: No   Hallucinations:Hallucinations: None; Other (  comment) (Earlier I had hallucinations, "I could see people")  Ideas of Reference:None  Suicidal Thoughts:Suicidal Thoughts: Yes, Active SI Active Intent and/or Plan:  With Plan; Without Intent  Homicidal Thoughts:Homicidal Thoughts: No   Sensorium  Memory:Immediate Good; Recent Good  Judgment:Intact  Insight:Present   Executive Functions  Concentration:Good  Attention Span:Good  [name]:Good  Language:Good   Psychomotor Activity  Psychomotor Activity:Psychomotor Activity: Tremor   Assets  Assets:Communication Skills; Desire for Improvement; Financial Resources/Insurance; Intimacy; Leisure Time; Resilience; Social Support; Physical Health   Sleep  Sleep:Sleep: Good   Nutritional Assessment (For OBS and FBC admissions only) Has the patient had a weight loss or gain of 10 pounds or more in the last 3 months?: No Has the patient had a decrease in food intake/or appetite?: No Does the patient have dental problems?: No Does the patient have eating habits or behaviors that may be indicators of an eating disorder including binging or inducing vomiting?: No Has the patient recently lost weight without trying?: 0 Has the patient been eating poorly because of a decreased appetite?: 0 Malnutrition Screening Tool Score: 0    Physical Exam Vitals and nursing note reviewed.  Constitutional:      Appearance: Normal appearance. He is well-developed.  HENT:     Head: Normocephalic and atraumatic.     Nose: Nose normal.  Cardiovascular:     Rate and Rhythm: Normal rate.  Pulmonary:     Effort: Pulmonary effort is normal.  Musculoskeletal:        General: Normal range of motion.     Cervical back: Normal range of motion.  Neurological:     Mental Status: He is alert and oriented to person, place, and time.     Motor: Tremor present.  Psychiatric:        Attention and Perception: Attention and perception normal.        Mood and Affect: Affect normal. Mood is depressed.        Speech: Speech normal.        Behavior: Behavior normal. Behavior is cooperative.        Thought Content: Thought content includes  suicidal ideation. Thought content includes suicidal plan.        Cognition and Memory: Cognition and memory normal.   Review of Systems  Constitutional: Negative.   HENT: Negative.    Eyes: Negative.   Respiratory: Negative.    Cardiovascular: Negative.   Gastrointestinal: Negative.   Genitourinary: Negative.   Musculoskeletal: Negative.   Skin: Negative.   Neurological: Negative.   Endo/Heme/Allergies: Negative.   Psychiatric/Behavioral:  Positive for depression, substance abuse and suicidal ideas.    Blood pressure (!) 151/112, pulse 100, temperature 98.6 F (37 C), temperature source Oral, resp. rate 19, SpO2 100 %. There is no height or weight on file to calculate BMI.  Past Psychiatric History: Alcohol abuse, suicidal ideations  Is the patient at risk to self? Yes  Has the patient been a risk to self in the past 6 months? No .    Has the patient been a risk to self within the distant past? No   Is the patient a risk to others? No   Has the patient been a risk to others in the past 6 months? No   Has the patient been a risk to others within the distant past? 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  Family History: No family history on file.  Social History:  Social History   Socioeconomic History   Marital status: Single    Spouse name: Not on file   Number of children: Not on file   Years of education: Not on file   Highest education level: Not on file  Occupational History   Not on file  Tobacco Use   Smoking status: Every Day    Types: Cigarettes   Smokeless tobacco: Never  Vaping Use   Vaping Use: Never used  Substance and Sexual Activity   Alcohol use: Yes   Drug use: Not Currently   Sexual activity: Not Currently  Other Topics Concern   Not on file  Social History Narrative    Not on file   Social Determinants of Health   Financial Resource Strain: Not on file  Food Insecurity: Not on file  Transportation Needs: Not on file  Physical Activity: Not on file  Stress: Not on file  Social Connections: Not on file  Intimate Partner Violence: Not on file    SDOH:  SDOH Screenings   Alcohol Screen: Not on file  Depression (PHQ2-9): Medium Risk   PHQ-2 Score: 10  Financial Resource Strain: Not on file  Food Insecurity: Not on file  Housing: Not on file  Physical Activity: Not on file  Social Connections: Not on file  Stress: Not on file  Tobacco Use: High Risk   Smoking Tobacco Use: Every Day   Smokeless Tobacco Use: Never   Passive Exposure: Not on file  Transportation Needs: Not on file    Last Labs:  Admission on 11/18/2021, Discharged on 11/18/2021  Component Date Value Ref Range Status   Sodium 11/18/2021 139  135 - 145 mmol/L Final   Potassium 11/18/2021 3.4 (L)  3.5 - 5.1 mmol/L Final   Chloride 11/18/2021 99  98 - 111 mmol/L Final   CO2 11/18/2021 23  22 - 32 mmol/L Final   Glucose, Bld 11/18/2021 90  70 - 99 mg/dL Final   Glucose reference range applies only to samples taken after fasting for at least 8 hours.   BUN 11/18/2021 10  6 - 20 mg/dL Final   Creatinine, Ser 11/18/2021 0.72  0.61 - 1.24 mg/dL Final   Calcium 11/18/2021 8.7 (L)  8.9 - 10.3 mg/dL Final   Total Protein 11/18/2021 8.3 (H)  6.5 - 8.1 g/dL Final   Albumin 11/18/2021 5.0  3.5 - 5.0 g/dL Final   AST 11/18/2021 32  15 - 41 U/L Final   ALT 11/18/2021 20  0 - 44 U/L Final   Alkaline Phosphatase 11/18/2021 91  38 - 126 U/L Final   Total Bilirubin 11/18/2021 1.7 (H)  0.3 - 1.2 mg/dL Final   GFR, Estimated 11/18/2021 >60  >60 mL/min Final   Comment: (NOTE) Calculated using the CKD-EPI Creatinine Equation (2021)    Anion gap 11/18/2021 17 (H)  5 - 15 Final   Performed at [facility], [address] [place], [place] [address]   Alcohol, Ethyl (B) 11/18/2021  360 (HH)  <10 mg/dL Final   Comment: CRITICAL RESULT CALLED TO, READ BACK BY AND VERIFIED WITH: L. ADKINS,CHARGE RN [phone] 11/18/2021 T. TYSOR (NOTE) Lowest detectable limit for serum alcohol is 10 mg/dL.  For medical purposes only. Performed at [facility], [address] [place], [place] [address]    WBC 11/18/2021 7.7  4.0 - 10.5 K/uL Final   RBC 11/18/2021 5.21  4.22 - 5.81 MIL/uL Final   Hemoglobin 11/18/2021 16.9  13.0 - 17.0 g/dL Final   HCT 11/18/2021 49.4  39.0 - 52.0 % Final   MCV 11/18/2021 94.8  80.0 - 100.0 fL Final   MCH 11/18/2021 32.4  26.0 - 34.0 pg Final   MCHC 11/18/2021 34.2  30.0 - 36.0 g/dL Final   RDW 11/18/2021 14.7  11.5 - 15.5 % Final   Platelets 11/18/2021 160  150 - 400 K/uL Final   nRBC 11/18/2021 0.0  0.0 - 0.2 % Final   Neutrophils Relative % 11/18/2021 69  % Final   Neutro Abs 11/18/2021 5.2  1.7 - 7.7 K/uL Final   Lymphocytes Relative 11/18/2021 25  % Final   Lymphs Abs 11/18/2021 2.0  0.7 - 4.0 K/uL Final   Monocytes Relative 11/18/2021 6  % Final   Monocytes Absolute 11/18/2021 0.4  0.1 - 1.0 K/uL Final   Eosinophils Relative 11/18/2021 0  % Final   Eosinophils Absolute 11/18/2021 0.0  0.0 - 0.5 K/uL Final   Basophils Relative 11/18/2021 0  % Final   Basophils Absolute 11/18/2021 0.0  0.0 - 0.1 K/uL Final   Immature Granulocytes 11/18/2021 0  % Final   Abs Immature Granulocytes 11/18/2021 0.02  0.00 - 0.07 K/uL Final   Performed at [facility], [address] [place], [place] [address]   Opiates 11/18/2021 NONE DETECTED  NONE DETECTED Final   Cocaine 11/18/2021 NONE DETECTED  NONE DETECTED Final   Benzodiazepines 11/18/2021 POSITIVE (A)  NONE DETECTED Final   Amphetamines 11/18/2021 NONE DETECTED  NONE DETECTED Final   Tetrahydrocannabinol 11/18/2021 NONE DETECTED  NONE DETECTED Final   Barbiturates 11/18/2021 NONE DETECTED  NONE DETECTED Final   Comment: (NOTE) DRUG SCREEN FOR MEDICAL PURPOSES ONLY.  IF CONFIRMATION  IS NEEDED FOR ANY PURPOSE, NOTIFY LAB WITHIN 5 DAYS.  LOWEST DETECTABLE LIMITS FOR URINE DRUG SCREEN Drug Class                     Cutoff (ng/mL) Amphetamine and metabolites    1000 Barbiturate and metabolites    200 Benzodiazepine                 545 Tricyclics and metabolites     300 Opiates and metabolites        300 Cocaine and metabolites        300 THC                            50 Performed at [facility], [address] [place], [place] [address]    SARS Coronavirus 2 by RT PCR 11/18/2021 NEGATIVE  NEGATIVE Final   Comment: (NOTE) SARS-CoV-2 target nucleic acids are NOT DETECTED.  The SARS-CoV-2 RNA is generally detectable in upper respiratory specimens during the acute phase of infection. The lowest concentration of SARS-CoV-2 viral copies this assay can detect is 138 copies/mL. A negative result does not preclude SARS-Cov-2 infection and should not be used as the sole basis for treatment or other patient management decisions. A negative result may occur with  improper specimen collection/handling, submission of specimen other than nasopharyngeal swab, presence of viral mutation(s) within the areas targeted by this assay, and inadequate number of viral copies(<138 copies/mL). A negative result must  be combined with clinical observations, patient history, and epidemiological information. The expected result is Negative.  Fact Sheet for Patients:  EntrepreneurPulse.com.au  Fact Sheet for Healthcare Providers:  IncredibleEmployment.be  This test is no                          t yet approved or cleared by the [place] FDA and  has been authorized for detection and/or diagnosis of SARS-CoV-2 by FDA under an Emergency Use Authorization (EUA). This EUA will remain  in effect (meaning this test can be used) for the duration of the COVID-19 declaration under Section 564(b)(1) of the Act, 21 U.S.C.section  360bbb-3(b)(1), unless the authorization is terminated  or revoked sooner.       Influenza A by PCR 11/18/2021 NEGATIVE  NEGATIVE Final   Influenza B by PCR 11/18/2021 NEGATIVE  NEGATIVE Final   Comment: (NOTE) The Xpert Xpress SARS-CoV-2/FLU/RSV plus assay is intended as an aid in the diagnosis of influenza from Nasopharyngeal swab specimens and should not be used as a sole basis for treatment. Nasal washings and aspirates are unacceptable for Xpert Xpress SARS-CoV-2/FLU/RSV testing.  Fact Sheet for Patients: EntrepreneurPulse.com.au  Fact Sheet for Healthcare Providers: IncredibleEmployment.be  This test is not yet approved or cleared by the [place] FDA and has been authorized for detection and/or diagnosis of SARS-CoV-2 by FDA under an Emergency Use Authorization (EUA). This EUA will remain in effect (meaning this test can be used) for the duration of the COVID-19 declaration under Section 564(b)(1) of the Act, 21 U.S.C. section 360bbb-3(b)(1), unless the authorization is terminated or revoked.  Performed at [facility] [place], [address] [place], [place] [address]   Admission on 11/17/2021, Discharged on 11/17/2021  Component Date Value Ref Range Status   WBC 11/17/2021 6.1  4.0 - 10.5 K/uL Final   RBC 11/17/2021 5.24  4.22 - 5.81 MIL/uL Final   Hemoglobin 11/17/2021 16.7  13.0 - 17.0 g/dL Final   HCT 11/17/2021 49.8  39.0 - 52.0 % Final   MCV 11/17/2021 95.0  80.0 - 100.0 fL Final   MCH 11/17/2021 31.9  26.0 - 34.0 pg Final   MCHC 11/17/2021 33.5  30.0 - 36.0 g/dL Final   RDW 11/17/2021 14.6  11.5 - 15.5 % Final   Platelets 11/17/2021 154  150 - 400 K/uL Final   nRBC 11/17/2021 0.0  0.0 - 0.2 % Final   Neutrophils Relative % 11/17/2021 56  % Final   Neutro Abs 11/17/2021 3.5  1.7 - 7.7 K/uL Final   Lymphocytes Relative 11/17/2021 34  % Final   Lymphs Abs 11/17/2021 2.0  0.7 - 4.0 K/uL Final   Monocytes Relative  11/17/2021 7  % Final   Monocytes Absolute 11/17/2021 0.4  0.1 - 1.0 K/uL Final   Eosinophils Relative 11/17/2021 2  % Final   Eosinophils Absolute 11/17/2021 0.1  0.0 - 0.5 K/uL Final   Basophils Relative 11/17/2021 1  % Final   Basophils Absolute 11/17/2021 0.0  0.0 - 0.1 K/uL Final   Immature Granulocytes 11/17/2021 0  % Final   Abs Immature Granulocytes 11/17/2021 0.01  0.00 - 0.07 K/uL Final   Performed at [facility], [address] [place], [place] [address]   Sodium 11/17/2021 142  135 - 145 mmol/L Final   Potassium 11/17/2021 3.4 (L)  3.5 - 5.1 mmol/L Final   Chloride 11/17/2021 102  98 - 111 mmol/L Final  CO2 11/17/2021 27  22 - 32 mmol/L Final   Glucose, Bld 11/17/2021 92  70 - 99 mg/dL Final   Glucose reference range applies only to samples taken after fasting for at least 8 hours.   BUN 11/17/2021 7  6 - 20 mg/dL Final   Creatinine, Ser 11/17/2021 0.84  0.61 - 1.24 mg/dL Final   Calcium 11/17/2021 8.8 (L)  8.9 - 10.3 mg/dL Final   Total Protein 11/17/2021 7.3  6.5 - 8.1 g/dL Final   Albumin 11/17/2021 4.5  3.5 - 5.0 g/dL Final   AST 11/17/2021 24  15 - 41 U/L Final   ALT 11/17/2021 15  0 - 44 U/L Final   Alkaline Phosphatase 11/17/2021 86  38 - 126 U/L Final   Total Bilirubin 11/17/2021 1.0  0.3 - 1.2 mg/dL Final   GFR, Estimated 11/17/2021 >60  >60 mL/min Final   Comment: (NOTE) Calculated using the CKD-EPI Creatinine Equation (2021)    Anion gap 11/17/2021 13  5 - 15 Final   Performed at [facility], [place] [address] [place] [address] [place], [place] [address]   Alcohol, Ethyl (B) 11/17/2021 329 (HH)  <10 mg/dL Final   Comment: CRITICAL RESULT CALLED TO, READ BACK BY AND VERIFIED WITH: MARVA SIMMS RN ON 11/17/21 AT 1206 [place] (NOTE) Lowest detectable limit for serum alcohol is 10 mg/dL.  For medical purposes only. Performed at [facility], [address] [place], [place] [address]   Admission on 11/03/2021, Discharged on 11/03/2021   Component Date Value Ref Range Status   Sodium 11/03/2021 136  135 - 145 mmol/L Final   Potassium 11/03/2021 3.7  3.5 - 5.1 mmol/L Final   Chloride 11/03/2021 100  98 - 111 mmol/L Final   CO2 11/03/2021 25  22 - 32 mmol/L Final   Glucose, Bld 11/03/2021 108 (H)  70 - 99 mg/dL Final   Glucose reference range applies only to samples taken after fasting for at least 8 hours.   BUN 11/03/2021 8  6 - 20 mg/dL Final   Creatinine, Ser 11/03/2021 0.70  0.61 - 1.24 mg/dL Final   Calcium 11/03/2021 8.8 (L)  8.9 - 10.3 mg/dL Final   Total Protein 11/03/2021 7.1  6.5 - 8.1 g/dL Final   Albumin 11/03/2021 4.1  3.5 - 5.0 g/dL Final   AST 11/03/2021 19  15 - 41 U/L Final   ALT 11/03/2021 18  0 - 44 U/L Final   Alkaline Phosphatase 11/03/2021 73  38 - 126 U/L Final   Total Bilirubin 11/03/2021 0.6  0.3 - 1.2 mg/dL Final   GFR, Estimated 11/03/2021 >60  >60 mL/min Final   Comment: (NOTE) Calculated using the CKD-EPI Creatinine Equation (2021)    Anion gap 11/03/2021 11  5 - 15 Final   Performed at [facility], [address] [place], [place] [address]   Alcohol, Ethyl (B) 11/03/2021 293 (H)  <10 mg/dL Final   Comment: (NOTE) Lowest detectable limit for serum alcohol is 10 mg/dL.  For medical purposes only. Performed at [facility], [address] [place], [place] [address]    Salicylate Lvl 39/11/90 <7.0 (L)  7.0 - 30.0 mg/dL Final   Performed at [facility], [address] [place], [place] [address]   Acetaminophen (Tylenol), Serum 11/03/2021 <10 (L)  10 - 30 ug/mL Final   Performed at [facility], [address], [place], [place] [address]  WBC 11/03/2021 4.8  4.0 - 10.5 K/uL Final   RBC 11/03/2021 4.70  4.22 - 5.81 MIL/uL Final   Hemoglobin 11/03/2021 15.2  13.0 - 17.0 g/dL Final   HCT 11/03/2021 45.1  39.0 - 52.0 % Final   MCV 11/03/2021 96.0  80.0 - 100.0 fL Final   MCH 11/03/2021 32.3  26.0 - 34.0 pg Final   MCHC 11/03/2021 33.7   30.0 - 36.0 g/dL Final   RDW 11/03/2021 14.6  11.5 - 15.5 % Final   Platelets 11/03/2021 292  150 - 400 K/uL Final   nRBC 11/03/2021 0.0  0.0 - 0.2 % Final   Performed at [facility], [address] [place], [place] [address]   Opiates 11/03/2021 NONE DETECTED  NONE DETECTED Final   Cocaine 11/03/2021 NONE DETECTED  NONE DETECTED Final   Benzodiazepines 11/03/2021 POSITIVE (A)  NONE DETECTED Final   Amphetamines 11/03/2021 NONE DETECTED  NONE DETECTED Final   Tetrahydrocannabinol 11/03/2021 NONE DETECTED  NONE DETECTED Final   Barbiturates 11/03/2021 NONE DETECTED  NONE DETECTED Final   Comment: (NOTE) DRUG SCREEN FOR MEDICAL PURPOSES ONLY.  IF CONFIRMATION IS NEEDED FOR ANY PURPOSE, NOTIFY LAB WITHIN 5 DAYS.  LOWEST DETECTABLE LIMITS FOR URINE DRUG SCREEN Drug Class                     Cutoff (ng/mL) Amphetamine and metabolites    1000 Barbiturate and metabolites    200 Benzodiazepine                 974 Tricyclics and metabolites     300 Opiates and metabolites        300 Cocaine and metabolites        300 THC                            50 Performed at [facility], [address] [place], [place] [address]    SARS Coronavirus 2 by RT PCR 11/03/2021 NEGATIVE  NEGATIVE Final   Comment: (NOTE) SARS-CoV-2 target nucleic acids are NOT DETECTED.  The SARS-CoV-2 RNA is generally detectable in upper respiratory specimens during the acute phase of infection. The lowest concentration of SARS-CoV-2 viral copies this assay can detect is 138 copies/mL. A negative result does not preclude SARS-Cov-2 infection and should not be used as the sole basis for treatment or other patient management decisions. A negative result may occur with  improper specimen collection/handling, submission of specimen other than nasopharyngeal swab, presence of viral mutation(s) within the areas targeted by this assay, and inadequate number of viral copies(<138 copies/mL). A  negative result must be combined with clinical observations, patient history, and epidemiological information. The expected result is Negative.  Fact Sheet for Patients:  EntrepreneurPulse.com.au  Fact Sheet for Healthcare Providers:  IncredibleEmployment.be  This test is no                          t yet approved or cleared by the [place] FDA and  has been authorized for detection and/or diagnosis of SARS-CoV-2 by FDA under an Emergency Use Authorization (EUA). This EUA will remain  in effect (meaning this test can be used) for the duration of the COVID-19 declaration under Section 564(b)(1) of the Act, 21 U.S.C.section 360bbb-3(b)(1), unless the authorization is terminated  or revoked sooner.       Influenza A  by PCR 11/03/2021 NEGATIVE  NEGATIVE Final   Influenza B by PCR 11/03/2021 NEGATIVE  NEGATIVE Final   Comment: (NOTE) The Xpert Xpress SARS-CoV-2/FLU/RSV plus assay is intended as an aid in the diagnosis of influenza from Nasopharyngeal swab specimens and should not be used as a sole basis for treatment. Nasal washings and aspirates are unacceptable for Xpert Xpress SARS-CoV-2/FLU/RSV testing.  Fact Sheet for Patients: EntrepreneurPulse.com.au  Fact Sheet for Healthcare Providers: IncredibleEmployment.be  This test is not yet approved or cleared by the [place] FDA and has been authorized for detection and/or diagnosis of SARS-CoV-2 by FDA under an Emergency Use Authorization (EUA). This EUA will remain in effect (meaning this test can be used) for the duration of the COVID-19 declaration under Section 564(b)(1) of the Act, 21 U.S.C. section 360bbb-3(b)(1), unless the authorization is terminated or revoked.  Performed at [facility], [address] [place], [place] [address]   Admission on 09/22/2021, Discharged on 09/22/2021  Component Date Value Ref Range Status    WBC 09/22/2021 6.1  4.0 - 10.5 K/uL Final   RBC 09/22/2021 4.91  4.22 - 5.81 MIL/uL Final   Hemoglobin 09/22/2021 15.8  13.0 - 17.0 g/dL Final   HCT 09/22/2021 46.2  39.0 - 52.0 % Final   MCV 09/22/2021 94.1  80.0 - 100.0 fL Final   MCH 09/22/2021 32.2  26.0 - 34.0 pg Final   MCHC 09/22/2021 34.2  30.0 - 36.0 g/dL Final   RDW 09/22/2021 13.2  11.5 - 15.5 % Final   Platelets 09/22/2021 231  150 - 400 K/uL Final   nRBC 09/22/2021 0.0  0.0 - 0.2 % Final   Neutrophils Relative % 09/22/2021 47  % Final   Neutro Abs 09/22/2021 2.9  1.7 - 7.7 K/uL Final   Lymphocytes Relative 09/22/2021 42  % Final   Lymphs Abs 09/22/2021 2.6  0.7 - 4.0 K/uL Final   Monocytes Relative 09/22/2021 8  % Final   Monocytes Absolute 09/22/2021 0.5  0.1 - 1.0 K/uL Final   Eosinophils Relative 09/22/2021 2  % Final   Eosinophils Absolute 09/22/2021 0.1  0.0 - 0.5 K/uL Final   Basophils Relative 09/22/2021 1  % Final   Basophils Absolute 09/22/2021 0.0  0.0 - 0.1 K/uL Final   Immature Granulocytes 09/22/2021 0  % Final   Abs Immature Granulocytes 09/22/2021 0.01  0.00 - 0.07 K/uL Final   Performed at [facility], [place] [address] [place], [place] [address]   Sodium 09/22/2021 142  135 - 145 mmol/L Final   Potassium 09/22/2021 3.3 (L)  3.5 - 5.1 mmol/L Final   Chloride 09/22/2021 108  98 - 111 mmol/L Final   CO2 09/22/2021 25  22 - 32 mmol/L Final   Glucose, Bld 09/22/2021 134 (H)  70 - 99 mg/dL Final   Glucose reference range applies only to samples taken after fasting for at least 8 hours.   BUN 09/22/2021 8  6 - 20 mg/dL Final   Creatinine, Ser 09/22/2021 0.62  0.61 - 1.24 mg/dL Final   Calcium 09/22/2021 8.6 (L)  8.9 - 10.3 mg/dL Final   Total Protein 09/22/2021 7.4  6.5 - 8.1 g/dL Final   Albumin 09/22/2021 4.2  3.5 - 5.0 g/dL Final   AST 09/22/2021 21  15 - 41 U/L Final   ALT 09/22/2021 13  0 - 44 U/L Final   Alkaline Phosphatase 09/22/2021 87  38 - 126 U/L Final   Total Bilirubin  09/22/2021 0.4  0.3 - 1.2 mg/dL Final   GFR, Estimated 09/22/2021 >60  >60 mL/min Final   Comment: (NOTE) Calculated using the CKD-EPI Creatinine Equation (2021)    Anion gap 09/22/2021 9  5 - 15 Final   Performed at [facility], [place] [address] [place], [place] [address]   Alcohol, Ethyl (B) 09/22/2021 314 (HH)  <10 mg/dL Final   Comment: CRITICAL RESULT CALLED TO, READ BACK BY AND VERIFIED WITH:  TJ RIVERS RN 09/22/21 @ 0349 VS (NOTE) Lowest detectable limit for serum alcohol is 10 mg/dL.  For medical purposes only. Performed at [facility], [place] [address], [place], [place] [address]    Salicylate Lvl 93/23/5573 <7.0 (L)  7.0 - 30.0 mg/dL Final   Performed at [place] [address] [place], [place] [address]   Acetaminophen (Tylenol), Serum 09/22/2021 <10 (L)  10 - 30 ug/mL Final   Comment: (NOTE) Therapeutic concentrations vary significantly. A range of 10-30 ug/mL  may be an effective concentration for many patients. However, some  are best treated at concentrations outside of this range. Acetaminophen concentrations >150 ug/mL at 4 hours after ingestion  and >50 ug/mL at 12 hours after ingestion are often associated with  toxic reactions.  Performed at [facility], [place] [address], [place], [place] [address]    Opiates 09/22/2021 NONE DETECTED  NONE DETECTED Final   Cocaine 09/22/2021 NONE DETECTED  NONE DETECTED Final   Benzodiazepines 09/22/2021 POSITIVE (A)  NONE DETECTED Final   Amphetamines 09/22/2021 NONE DETECTED  NONE DETECTED Final   Tetrahydrocannabinol 09/22/2021 NONE DETECTED  NONE DETECTED Final   Barbiturates 09/22/2021 NONE DETECTED  NONE DETECTED Final   Comment: (NOTE) DRUG SCREEN FOR MEDICAL PURPOSES ONLY.  IF CONFIRMATION IS NEEDED FOR ANY PURPOSE, NOTIFY LAB WITHIN 5 DAYS.  LOWEST DETECTABLE LIMITS FOR URINE DRUG SCREEN Drug Class                     Cutoff  (ng/mL) Amphetamine and metabolites    1000 Barbiturate and metabolites    200 Benzodiazepine                 237 Tricyclics and metabolites     300 Opiates and metabolites        300 Cocaine and metabolites        300 THC                            50 Performed at [facility], [place] [address], [place], [place] [address]    SARS Coronavirus 2 by RT PCR 09/22/2021 NEGATIVE  NEGATIVE Final   Comment: (NOTE) SARS-CoV-2 target nucleic acids are NOT DETECTED.  The SARS-CoV-2 RNA is generally detectable in upper respiratory specimens during the acute phase of infection. The lowest concentration of SARS-CoV-2 viral copies this assay can detect is 138 copies/mL. A negative result does not preclude SARS-Cov-2 infection and should not be used as the sole basis for treatment or other patient management decisions. A negative result may occur with  improper specimen collection/handling, submission of specimen other than nasopharyngeal swab, presence of viral mutation(s) within the areas targeted by this assay, and inadequate number of viral copies(<138 copies/mL). A negative result must be combined with clinical observations, patient history, and epidemiological information. The expected result is Negative.  Fact Sheet for Patients:  EntrepreneurPulse.com.au  Fact Sheet for Healthcare  Providers:  IncredibleEmployment.be  This test is no                          t yet approved or cleared by the [place] and  has been authorized for detection and/or diagnosis of SARS-CoV-2 by FDA under an Emergency Use Authorization (EUA). This EUA will remain  in effect (meaning this test can be used) for the duration of the COVID-19 declaration under Section 564(b)(1) of the Act, 21 U.S.C.section 360bbb-3(b)(1), unless the authorization is terminated  or revoked sooner.       Influenza A by PCR 09/22/2021 NEGATIVE  NEGATIVE Final    Influenza B by PCR 09/22/2021 NEGATIVE  NEGATIVE Final   Comment: (NOTE) The Xpert Xpress SARS-CoV-2/FLU/RSV plus assay is intended as an aid in the diagnosis of influenza from Nasopharyngeal swab specimens and should not be used as a sole basis for treatment. Nasal washings and aspirates are unacceptable for Xpert Xpress SARS-CoV-2/FLU/RSV testing.  Fact Sheet for Patients: EntrepreneurPulse.com.au  Fact Sheet for Healthcare Providers: IncredibleEmployment.be  This test is not yet approved or cleared by the [place] FDA and has been authorized for detection and/or diagnosis of SARS-CoV-2 by FDA under an Emergency Use Authorization (EUA). This EUA will remain in effect (meaning this test can be used) for the duration of the COVID-19 declaration under Section 564(b)(1) of the Act, 21 U.S.C. section 360bbb-3(b)(1), unless the authorization is terminated or revoked.  Performed at [facility], [place] [address], [place], [place] [address]   Admission on 08/25/2021, Discharged on 08/25/2021  Component Date Value Ref Range Status   WBC 08/25/2021 10.5  4.0 - 10.5 K/uL Final   RBC 08/25/2021 4.61  4.22 - 5.81 MIL/uL Final   Hemoglobin 08/25/2021 14.8  13.0 - 17.0 g/dL Final   HCT 08/25/2021 44.6  39.0 - 52.0 % Final   MCV 08/25/2021 96.7  80.0 - 100.0 fL Final   MCH 08/25/2021 32.1  26.0 - 34.0 pg Final   MCHC 08/25/2021 33.2  30.0 - 36.0 g/dL Final   RDW 08/25/2021 12.8  11.5 - 15.5 % Final   Platelets 08/25/2021 227  150 - 400 K/uL Final   nRBC 08/25/2021 0.0  0.0 - 0.2 % Final   Neutrophils Relative % 08/25/2021 71  % Final   Neutro Abs 08/25/2021 7.6  1.7 - 7.7 K/uL Final   Lymphocytes Relative 08/25/2021 18  % Final   Lymphs Abs 08/25/2021 1.8  0.7 - 4.0 K/uL Final   Monocytes Relative 08/25/2021 9  % Final   Monocytes Absolute 08/25/2021 0.9  0.1 - 1.0 K/uL Final   Eosinophils Relative 08/25/2021 1  % Final    Eosinophils Absolute 08/25/2021 0.1  0.0 - 0.5 K/uL Final   Basophils Relative 08/25/2021 1  % Final   Basophils Absolute 08/25/2021 0.1  0.0 - 0.1 K/uL Final   Immature Granulocytes 08/25/2021 0  % Final   Abs Immature Granulocytes 08/25/2021 0.04  0.00 - 0.07 K/uL Final   Performed at [facility], [place] [address], [place], [place] [address]   Alcohol, Ethyl (B) 08/25/2021 147 (H)  <10 mg/dL Final   Comment: (NOTE) Lowest detectable limit for serum alcohol is 10 mg/dL.  For medical purposes only. Performed at [facility], [place] [address] [place], [place] [address]    Sodium 08/25/2021 138  135 - 145 mmol/L Final   Potassium 08/25/2021 3.9  3.5 -  5.1 mmol/L Final   Chloride 08/25/2021 105  98 - 111 mmol/L Final   CO2 08/25/2021 23  22 - 32 mmol/L Final   Glucose, Bld 08/25/2021 103 (H)  70 - 99 mg/dL Final   Glucose reference range applies only to samples taken after fasting for at least 8 hours.   BUN 08/25/2021 7  6 - 20 mg/dL Final   Creatinine, Ser 08/25/2021 0.71  0.61 - 1.24 mg/dL Final   Calcium 08/25/2021 8.9  8.9 - 10.3 mg/dL Final   Total Protein 08/25/2021 6.5  6.5 - 8.1 g/dL Final   Albumin 08/25/2021 3.9  3.5 - 5.0 g/dL Final   AST 08/25/2021 22  15 - 41 U/L Final   ALT 08/25/2021 32  0 - 44 U/L Final   Alkaline Phosphatase 08/25/2021 68  38 - 126 U/L Final   Total Bilirubin 08/25/2021 0.3  0.3 - 1.2 mg/dL Final   GFR, Estimated 08/25/2021 >60  >60 mL/min Final   Comment: (NOTE) Calculated using the CKD-EPI Creatinine Equation (2021)    Anion gap 08/25/2021 10  5 - 15 Final   Performed at [place] [address], [place], [place] [address]   Prothrombin Time 08/25/2021 12.6  11.4 - 15.2 seconds Final   INR 08/25/2021 0.9  0.8 - 1.2 Final   Comment: (NOTE) INR goal varies based on device and disease states. Performed at [facility], [place] [address] [place], [place] [address]    Troponin I (High Sensitivity) 08/25/2021 7  <18 ng/L  Final   Comment: (NOTE) Elevated high sensitivity troponin I (hsTnI) values and significant  changes across serial measurements may suggest ACS but many other  chronic and acute conditions are known to elevate hsTnI results.  Refer to the "Links" section for chest pain algorithms and additional  guidance. Performed at [facility], [place] [address] [place], [place] [address]     Allergies: Lorazepam and Oxycodone hcl  PTA Medications: (Not in a hospital admission)   Medical Decision Making  Patient reviewed with Dr. Serafina Mitchell.  He will be placed in observation area at [facility] for treatment and stabilization.  He will be reassessed on 11/19/2021, disposition will be determined at that time.  Patient remains voluntary at this time.  Patient medically cleared in the emergency department at [facility] prior to arrival.  Current medications: -Lorazepam taper to include: 1 mg 4 times daily for 6 doses, 1 mg 3 times daily for 3 doses, 1 mg 2 times daily for 2 doses, and 1 mg daily for 1 dose -Acetaminophen 650 mg every 6 as needed/mild pain -Maalox 30 mL oral every 4 as needed/digestion -Hydroxyzine 25 mg 3 times daily as needed/anxiety -Magnesium hydroxide 30 mL daily as needed/mild constipation -Trazodone 50 mg nightly as needed/sleep  CIWA Ativan protocol initiated: -Loperamide 2 to 4 mg oral as needed/diarrhea or loose stools -Lorazepam 1 mg every 6 hours as needed CIWA greater than 10 -Multivitamin with minerals 1 tablet daily -Ondansetron disintegrating tablet 4 mg every 6 as needed/nausea or vomiting -Thiamine injection 100 mg IM once -Thiamine tablet 100 mg daily      Recommendations  Based on my evaluation the patient does not appear to have an emergency medical condition. Patient reviewed with Dr Serafina Mitchell.   Lucky Rathke, FNP 11/18/21  6:13 PM

## 2021-11-18 NOTE — ED Provider Notes (Signed)
?  Physical Exam  ?BP (!) 139/98   Pulse 95   Temp 98.2 ?F (36.8 ?C) (Oral)   Resp 18   Ht '5\' 8"'$  (1.727 m)   Wt 59 kg   SpO2 96%   BMI 19.77 kg/m?  ? ?Physical Exam ? ?Procedures  ?Procedures ? ?ED Course / MDM  ? ?Clinical Course as of 11/18/21 1521  ?Fri Nov 18, 2021  ?0525 CBC is normal.  [CS]  ?0086 UDS positive for benzos.  [CS]  ?7619 CMP is unremarkable. EtOH is elevated similar to previous, patient is awake and alert.  [CS]  ?5093 Patient is medically clear for TTS evaluation.  [CS]  ?0707 Care of the patient signed out to the ongoing shift.  [CS]  ?  ?Clinical Course User Index ?[CS] Truddie Hidden, MD  ? ?Medical Decision Making ?Amount and/or Complexity of Data Reviewed ?Labs: ordered. ? ?Risk ?Prescription drug management. ? ? ?Patient is been seen by psychiatry and recommended transfer to [facility] for [facility] observation.  Medically cleared ? ? ? ? ?  ?Davonna Belling, MD ?11/18/21 1521 ? ?

## 2021-11-19 MED ORDER — AMLODIPINE BESYLATE 10 MG PO TABS
10.0000 mg | ORAL_TABLET | Freq: Every day | ORAL | Status: DC
  Administered 2021-11-19: 10 mg via ORAL
  Filled 2021-11-19: qty 1

## 2021-11-19 MED ORDER — HYDRALAZINE HCL 25 MG PO TABS
25.0000 mg | ORAL_TABLET | Freq: Two times a day (BID) | ORAL | Status: DC
Start: 2021-11-19 — End: 2021-11-19
  Administered 2021-11-19: 25 mg via ORAL
  Filled 2021-11-19: qty 1

## 2021-11-19 MED ORDER — HYDRALAZINE HCL 25 MG PO TABS: 25.0000 mg | ORAL_TABLET | Freq: Two times a day (BID) | ORAL | 0 refills | Status: DC

## 2021-11-19 NOTE — Discharge Instructions (Addendum)
Substance Abuse Resources ? ? ?[facility] ?- Admissions are currently completed Monday through Friday at [place]; both appointments and walk-ins are accepted.  Any individual that is a [facility] resident may present for a substance abuse screening and assessment for admission.  A person may be referred by numerous sources or self-refer.   Potential clients will be screened for medical necessity and appropriateness for the program.  Clients must meet criteria for high-intensity residential treatment services.  If clinically appropriate, a client will continue with the comprehensive clinical assessment and intake process, as well as enrollment in the [place]. ? ?Address: [address] ?[place], [place] [address] ?Admin Hours: Mon-Fri 8AM to [place] Hours: 24/7 ?Phone: [phone] ?Fax: [phone] ? ?[facility] (Detox) Facility Based Crisis:  ?These are 3 locations for services: Please call before arrival:   ? ?[facility])  ?Address: [address] [place], [place] [address] ?Phone: [phone] ? ?[facility]) ?Address: [address] [place], [place] [address] ?Phone#: [phone] ? ?[facility]) ?Address: [address] [place], [place], [place] [address] ?Phone#: [phone] ? ? ?[facility] ([facility]): (offers outpatient therapy and intensive outpatient substance abuse therapy).  ?[address], [place], [place] [address] ?Phone: [phone] ? ?[employer] [facility] ?Address: [address] [place], [place] [address] ?Phone: [phone] ? ?-The [employer] provides food, shelter and other programs and services to the homeless men of [place] through our [employer]. ? ?By offering safe shelter, three meals a day, clean clothing, Biblical counseling, financial planning, vocational training, GED/education and  employment assistance, we've helped mend the shattered lives of many homeless men since opening in 1974. ? ?We have approximately 267 beds available, with a max of 312 beds including mats for emergency situations and currently house an average of 270 men a night. ? ?Prospective Client Check-In Information ?Photo ID Required (State/ Out of State/ [facility]) - if photo ID is not available, clients are required to have a printout of a police/sheriff's criminal history report. ?Help out with chores around the [place]. ?No sex offender of any type (pending, charged, registered and/or any other sex related offenses) will be permitted to check in. ?Must be willing to abide by all rules, regulations, and policies established by the [employer]. ?The following will be provided - shelter, food, clothing, and biblical counseling. ?If you or someone you know is in need of assistance at our [facility] shelter in [place], [place], please call [phone] ext. [phone]. ? ? ?[facility]: ? ? Phone: [phone] ? ?[facility] ?SA Intensive Outpatient Program (SAIOP) means structured individual and group addiction activities and services that are provided at an outpatient program designed to assist adult and adolescent consumers to begin recovery and learn skills for recovery maintenance. The [place] program is offered at least 3 hours a day, 3 days a week. SAIOP services shall include a structured program consisting of, but not limited to, the following services: ?Individual counseling and support; Group counseling and support; Family counseling, training or support; Biochemical assays to identify recent drug use (e.g., urine drug screens); Strategies for relapse prevention to include community and social support systems in treatment; Life skills; Crisis contingency planning; Disease Management; and Treatment support activities that have been adapted or specifically designed for persons  with physical disabilities, or persons with co-occurring disorders  of mental illness and substance abuse/dependence or mental retardation/developmental disability and substance abuse/dependence. ? ?Phone: [phone]  ? ?[facility]) ? ?Address: [place], [place], [place] [address] ?Phone: [phone] ? ? ?[facility] ?Address: [address] [place], [place] [address] ?Phone: [phone] ? ?- a combination of group and individual sessions to meet the participants needs. This allows participants to engage in treatment and remain involved in their home and work life. ?- Transitional housing places program participants in a supportive living environment while they complete a treatment program and work to secure independent housing. ?- The Substance Abuse Intensive Outpatient Treatment Program at [facility] consists of structured group sessions and individual sessions that are designed to teach participants early recovery and relapse prevention skills. ?-[facility] works with the [employer] to provide a housing and treatment program for homeless veterans.  ? ?[facility].  ? ?Address: [address] [place], [place] [address] ?Phone#: [phone]  ? ?: Referrals to RTSA facilities can be made by [facility] and [facility].  Referrals are also accepted from physicians, private providers, hospital emergency rooms, family members, or any person who has knowledge of someone in the need of our services. ? ?[facility] will also offer the following outpatient services: (Monday through Friday 8am-5pm) ?  ?Partial Hospitalization Program (PHP) ?Substance Abuse Intensive Outpatient Program (SA-IOP) ?Group Therapy ?Medication Management ?Peer Living Room ?We also provide (24/7):  ?Assessments: Our mental health clinician and providers will conduct a focused mental health evaluation, assessing for  immediate safety concerns and further mental health needs. ?Referral: Our team will provide resources and help connect to community based mental health treatment, when indicated, including psychotherapy, psychiatry, and other specialized behavioral health or substance use disorder services (for those not already in treatment). ?Transitional Care: Our team providers in person bridging and/or telephonic follow-up during the patient's transition to outpatient services.  ? ?The [facility] ?24-Hour Call Center: ?[phone] ?Behavioral Health Crisis Line: ?[phone] ? ?Patient is instructed prior to discharge to: ? Take all medications as prescribed by his/her mental healthcare provider. ?Report any adverse effects and or reactions from the medicines to his/her outpatient provider promptly. ?Keep all scheduled appointments, to ensure that you are getting refills on time and to avoid any interruption in your medication.  If you are unable to keep an appointment call to reschedule.  Be sure to follow-up with resources and follow-up appointments provided.  ?Patient has been instructed & cautioned: To not engage in alcohol and or illegal drug use while on prescription medicines. ?In the event of worsening symptoms, patient is instructed to call the crisis hotline, 911 and or go to the nearest ED for appropriate evaluation and treatment of symptoms. ?To follow-up with his/her primary care provider for your other medical issues, concerns and or health care needs. ?  ? ?

## 2021-11-19 NOTE — ED Notes (Signed)
Pt was given a muffin, cereal, and milk for breakfast. 

## 2021-11-19 NOTE — ED Notes (Signed)
Pt given back all his belongings.  He was given AVS and verbalized understanding of follow up instructions.  ?

## 2021-11-19 NOTE — Progress Notes (Signed)
Pt given copy of [facility] phone numbers and encouraged to call  ?

## 2021-11-19 NOTE — ED Provider Notes (Signed)
FBC/OBS ASAP Discharge Summary  Date and Time: 11/19/2021 3:39 PM  Name: Charles Graves  MRN:  [id number]   Discharge Diagnoses:  Final diagnoses:  Alcohol abuse  Suicidal ideation    Subjective: Patient reports readiness to discharge home.  He reports plan to follow-up with residential substance use treatment options. Charles Graves plans to follow-up at [facility] in [place].  Patient states "I have to do something different, I am ready to get help with alcohol."  Patient is reassessed, face-to-face, by nurse practitioner.  He is reclined in observation area upon my approach, appears asleep.  He is easily awakened.  He is alert and oriented, pleasant and cooperative during assessment.  Charles Graves is insightful regarding substance use.  At this time he is committed to his sobriety and would like to stop using alcohol.  He presents with euthymic mood, congruent affect.  He denies suicidal and homicidal ideations.  He contracts verbally for safety with this Probation officer.  He denies auditory and visual hallucinations.  There is no evidence of delusional thought content and no indication that patient is responding to internal stimuli.  He endorses average sleep and appetite.  Patient declines any person to contact for collateral information at this time.  Patient offered support and encouragement.  Stay Summary: HPI from 11/18/2021 at 1804pm: Charles Graves is a 41 year old male who presented to the emergency department earlier this date with complaints of suicidal ideation.  Patient arrives to [facility] behavioral health for continued treatment and stabilization, he remains voluntary at this time.   Patient reports ongoing suicidal ideation.  Today he endorses plan to run into traffic or cut wrists.  He denies intent to act on this plan.  Patient reports he would like to be placed in residential substance use treatment to address his ongoing alcohol use disorder.  Patient endorses daily alcohol use x1 year.  He  reports he typically consumes between 1 to 2 pints of liquor daily.  He denies substance use aside from alcohol.  He denies any history of substance use treatment.   Charles Graves reports he has been diagnosed with depression, PTSD and bipolar disorder.  He is not linked with outpatient psychiatry currently.  He states "I usually get my medication from the emergency departments."  He reports he is compliant with trazodone, reports average sleep with trazodone.  He is not compliant with medications aside from trazodone.  No family mental health history reported.   Charles Graves denies any history of suicide attempts, he also denies history of nonsuicidal self-harm behavior.  He denies homicidal ideation.   Patient is assessed face-to-face by nurse practitioner.  He is seated in assessment area.  He is alert and oriented, pleasant and cooperative during assessment.  He presents with depressed mood, congruent affect. He has normal speech and behavior.  He denies both auditory and visual hallucinations currently.  He endorses visual hallucinations earlier this date, states "I could see people."   Patient is able to converse coherently with goal-directed thoughts and no distractibility or preoccupation.  He denies paranoia.  Objectively there is no evidence of psychosis/mania or delusional thinking.   Charles Graves is currently homeless and has most recently resided in a hotel in [place].  He reports ongoing homelessness for 1 year.  He is employed in the Production designer, theatre/television/film.  Patient endorses average sleep and decreased appetite.  Patient reports decreased appetite when using alcohol.   Patient offered support and encouragement.  He declines any person to contact for collateral information at this  time.     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 Family History: No family history on file. Family Psychiatric History: None reported Social History:  Social History   Substance and Sexual Activity  Alcohol Use Yes     Social History   Substance and Sexual Activity  Drug Use Not Currently    Social History   Socioeconomic History   Marital status: Single    Spouse name: Not on file   Number of children: Not on file   Years of education: Not on file   Highest education level: Not on file  Occupational History   Not on file  Tobacco Use   Smoking status: Every Day    Types: Cigarettes   Smokeless tobacco: Never  Vaping Use   Vaping Use: Never used  Substance and Sexual Activity   Alcohol use: Yes   Drug use: Not Currently   Sexual activity: Not Currently  Other Topics Concern   Not on file  Social History Narrative   Not on file   Social Determinants of Health   Financial Resource Strain: Not on file  Food Insecurity: Not on file  Transportation Needs: Not on file  Physical Activity: Not on file  Stress: Not on file  Social Connections: Not on file   SDOH:  SDOH Screenings   Alcohol Screen: Not on file  Depression (PHQ2-9): Medium Risk   PHQ-2 Score: 10  Financial Resource Strain: Not on file  Food Insecurity: Not on file  Housing: Not on file  Physical Activity: Not on file  Social Connections: Not on file  Stress: Not on file  Tobacco Use: High Risk   Smoking Tobacco Use: Every Day   Smokeless Tobacco Use: Never   Passive Exposure: Not on file  Transportation Needs: Not on file    Tobacco Cessation:  A prescription for an FDA-approved tobacco cessation medication was offered at discharge and the patient refused  Current Medications:  Current Facility-Administered Medications  Medication Dose Route Frequency Provider Last Rate Last Admin   acetaminophen (TYLENOL) tablet 650 mg  650 mg Oral Q6H  PRN Lucky Rathke, FNP       alum & mag hydroxide-simeth (MAALOX/MYLANTA) 200-200-20 MG/5ML suspension 30 mL  30 mL Oral Q4H PRN Lucky Rathke, FNP       amLODipine (NORVASC) tablet 10 mg  10 mg Oral Daily Lucky Rathke, FNP   10 mg at 11/19/21 1459   hydrALAZINE (APRESOLINE) tablet 25 mg  25 mg Oral BID Lucky Rathke, FNP   25 mg at 11/19/21 1459   hydrOXYzine (ATARAX) tablet 25 mg  25 mg Oral Q6H PRN Lucky Rathke, FNP       loperamide (IMODIUM) capsule 2-4 mg  2-4 mg Oral PRN Lucky Rathke, FNP       LORazepam (ATIVAN) tablet 1 mg  1 mg Oral Q6H PRN Lucky Rathke, FNP       LORazepam (ATIVAN) tablet 1 mg  1 mg Oral QID Lucky Rathke, FNP   1 mg at 11/19/21 1316   Followed by   Derrill Memo ON 11/20/2021] LORazepam (ATIVAN) tablet 1 mg  1 mg Oral  TID Lucky Rathke, FNP       Followed by   Derrill Memo ON 11/21/2021] LORazepam (ATIVAN) tablet 1 mg  1 mg Oral BID Lucky Rathke, FNP       Followed by   Derrill Memo ON 11/22/2021] LORazepam (ATIVAN) tablet 1 mg  1 mg Oral Daily Lucky Rathke, FNP       magnesium hydroxide (MILK OF MAGNESIA) suspension 30 mL  30 mL Oral Daily PRN Lucky Rathke, FNP       multivitamin with minerals tablet 1 tablet  1 tablet Oral Daily Lucky Rathke, FNP   1 tablet at 11/19/21 0951   ondansetron (ZOFRAN-ODT) disintegrating tablet 4 mg  4 mg Oral Q6H PRN Lucky Rathke, FNP   4 mg at 11/18/21 1857   thiamine tablet 100 mg  100 mg Oral Daily Lucky Rathke, FNP   100 mg at 11/19/21 2263   Current Outpatient Medications  Medication Sig Dispense Refill   amLODipine (NORVASC) 10 MG tablet Take 1 tablet (10 mg total) by mouth daily. 30 tablet 1   Cholecalciferol 25 MCG (1000 UT) tablet Take 1,000 Units by mouth daily.     folic acid (FOLVITE) 1 MG tablet Take 1 tablet (1 mg total) by mouth daily.     gabapentin (NEURONTIN) 300 MG capsule Take 300 mg by mouth 3 (three) times daily.     hydrALAZINE (APRESOLINE) 25 MG tablet Take 1 tablet (25 mg total) by mouth every 8 (eight) hours. (Patient  taking differently: Take 25 mg by mouth 2 (two) times daily.) 90 tablet 1   methocarbamol (ROBAXIN) 500 MG tablet Take 500 mg by mouth 2 (two) times daily.     mirtazapine (REMERON) 15 MG tablet Take 15 mg by mouth at bedtime.     Multiple Vitamin (MULTIVITAMIN WITH MINERALS) TABS tablet Take 1 tablet by mouth daily.     nicotine (NICODERM CQ - DOSED IN MG/24 HOURS) 21 mg/24hr patch Place 21 mg onto the skin daily.     pantoprazole (PROTONIX) 40 MG tablet Take 1 tablet (40 mg total) by mouth daily. 90 tablet 0   thiamine 100 MG tablet Take 1 tablet (100 mg total) by mouth daily. 30 tablet 0   traZODone (DESYREL) 50 MG tablet Take 50 mg by mouth at bedtime as needed for sleep.      PTA Medications: (Not in a hospital admission)   Musculoskeletal  Strength & Muscle Tone: within normal limits Gait & Station: normal Patient leans: N/A  Psychiatric Specialty Exam  Presentation  General Appearance: Appropriate for Environment; Casual  Eye Contact:Good  Speech:Clear and Coherent; Normal Rate  Speech Volume:Normal  Handedness:Right   Mood and Affect  Mood:Depressed  Affect:Congruent; Depressed   Thought Process  Thought Processes:Coherent; Goal Directed; Linear  Descriptions of Associations:Intact  Orientation:Full (Time, Place and Person)  Thought Content:Logical; WDL  Diagnosis of Schizophrenia or Schizoaffective disorder in past: No    Hallucinations:Hallucinations: None  Ideas of Reference:None  Suicidal Thoughts:Suicidal Thoughts: No SI Active Intent and/or Plan: Without Intent; Without Plan  Homicidal Thoughts:Homicidal Thoughts: No   Sensorium  Memory:Immediate Good; Recent Good  Judgment:Fair  Insight:Fair   Executive Functions  Concentration:Good  Attention Span:Good  Recall:Good  Fund of Knowledge:Good  Language:Good   Psychomotor Activity  Psychomotor Activity:Psychomotor Activity: Normal   Assets  Assets:Communication Skills;  Desire for Improvement; Financial Resources/Insurance; Intimacy; Leisure Time; Physical Health; Resilience; Social Support   Sleep  Sleep:Sleep: Good   Nutritional Assessment (  For OBS and [facility] admissions only) Has the patient had a weight loss or gain of 10 pounds or more in the last 3 months?: No Has the patient had a decrease in food intake/or appetite?: No Does the patient have dental problems?: No Does the patient have eating habits or behaviors that may be indicators of an eating disorder including binging or inducing vomiting?: No Has the patient recently lost weight without trying?: 0 Has the patient been eating poorly because of a decreased appetite?: 0 Malnutrition Screening Tool Score: 0    Physical Exam  Physical Exam Vitals and nursing note reviewed.  Constitutional:      Appearance: He is well-developed.  HENT:     Head: Normocephalic.     Nose: Nose normal.  Cardiovascular:     Rate and Rhythm: Normal rate.  Pulmonary:     Effort: Pulmonary effort is normal.  Musculoskeletal:        General: Normal range of motion.     Cervical back: Normal range of motion.  Skin:    General: Skin is warm and dry.  Neurological:     Mental Status: He is alert and oriented to person, place, and time.  Psychiatric:        Attention and Perception: Attention and perception normal.        Mood and Affect: Mood and affect normal.        Speech: Speech normal.        Behavior: Behavior normal. Behavior is cooperative.        Thought Content: Thought content normal.        Cognition and Memory: Cognition and memory normal.        Judgment: Judgment normal.   Review of Systems  Constitutional: Negative.   HENT: Negative.    Eyes: Negative.   Respiratory: Negative.    Cardiovascular: Negative.   Gastrointestinal: Negative.   Genitourinary: Negative.   Musculoskeletal: Negative.   Skin: Negative.   Neurological: Negative.   Endo/Heme/Allergies: Negative.    Psychiatric/Behavioral:  Positive for substance abuse.   Blood pressure (!) 133/102, pulse 82, temperature 98.2 F (36.8 C), temperature source Oral, resp. rate 14, SpO2 98 %. There is no height or weight on file to calculate BMI.  Demographic Factors:  Male and Caucasian  Loss Factors: NA  Historical Factors: NA  Risk Reduction Factors:   Employed, Positive social support, Positive therapeutic relationship, and Positive coping skills or problem solving skills  Continued Clinical Symptoms:  Alcohol/Substance Abuse/Dependencies  Cognitive Features That Contribute To Risk:  None    Suicide Risk:  Minimal: No identifiable suicidal ideation.  Patients presenting with no risk factors but with morbid ruminations; may be classified as minimal risk based on the severity of the depressive symptoms  Plan Of Care/Follow-up recommendations:  Patient reviewed with Dr. Leverne Humbles.  Follow-up with outpatient psychiatry, resources provided.  Follow-up with substance use treatment resources provided. Continue current medications.  Disposition: Discharge  Lucky Rathke, FNP 11/19/2021, 3:39 PM

## 2021-11-19 NOTE — ED Notes (Signed)
Pt resting with no distress noted.  Will continue to monitor for safety.  ?

## 2021-11-19 NOTE — ED Notes (Signed)
Pt asleep in bed. Respirations even and unlabored. Will continue to monitor for safety. ?

## 2021-11-19 NOTE — ED Notes (Addendum)
Pt awakens to voice commands.  Reports that he is feeling tired but otherwise "I feel ok". States that he had trouble sleeping in the night.   Reports SI without a specific plan.   Denies HI or AVH.    Minimal tremors noted  CIWA 4.   Pt given ordered medication and.  Pt given breakfast.    Will continue to monitor for safety.  ?

## 2021-11-19 NOTE — ED Notes (Signed)
Pt sitting up in bed.  Eating lunch.   No distress noted at this time.   Will continue to monitor for safety.  ?

## 2021-11-19 NOTE — ED Notes (Signed)
Safe transport called for Charles Graves to get his car at [employer].  ?

## 2021-11-19 NOTE — Progress Notes (Signed)
CSW provided the following resources for the patient to utilize upon discharge listed below. It should be noted that [place] contacted [facility] from [employer] in [place] and it was reported that there are no current beds available. CSW spoke with [facility] with [facility] in [place] that reported that no current beds on the unit, however there are observation beds in there [facility] where the patient can wait until a bed opens up or a placement can be made for him. Karena Addison reported that tomorrow she has one or two expected discharge from the unit; and three expected discharges Monday. It was reported that the patient is open to come to the facility for intake.  ? ?Substance Abuse Resources ? ? ?[facility] ?- Admissions are currently completed Monday through Friday at [place]; both appointments and walk-ins are accepted.  Any individual that is a [facility] resident may present for a substance abuse screening and assessment for admission.  A person may be referred by numerous sources or self-refer.   Potential clients will be screened for medical necessity and appropriateness for the program.  Clients must meet criteria for high-intensity residential treatment services.  If clinically appropriate, a client will continue with the comprehensive clinical assessment and intake process, as well as enrollment in the [facility]. ? ?Address: [address], [place] [address] ?Admin Hours: Mon-Fri 8AM to [place] Hours: 24/7 ?Phone: [phone] ?Fax: [phone] ? ?[facility] (Detox) Facility Based Crisis:  ?These are 3 locations for services: Please call before arrival:   ? ?[facility])  ?Address: [address] [place], [place] [address] ?Phone: [phone] ? ?[facility]) ?Address: [address] [place], [place] [address] ?Phone#: [phone] ? ?[place]  [facility]) ?Address: [address] [place], [place], [place] [address] ?Phone#: [phone] ? ? ?[facility] ([facility]): (offers outpatient therapy and intensive outpatient substance abuse therapy).  ?[address] [place], [place] [address] ?Phone: [phone] ? ?[employer] [facility] ?Address: [address] [place], [place] [address] ?Phone: [phone] ? ?-The [employer] provides food, shelter and other programs and services to the homeless men of [place] through our [employer]. ? ?By offering safe shelter, three meals a day, clean clothing, Biblical counseling, financial planning, vocational training, GED/education and employment assistance, we've helped mend the shattered lives of many homeless men since opening in 1974. ? ?We have approximately 267 beds available, with a max of 312 beds including mats for emergency situations and currently house an average of 270 men a night. ? ?Prospective Client Check-In Information ?Photo ID Required (State/ Out of State/ [facility]) - if photo ID is not available, clients are required to have a printout of a police/sheriff's criminal history report. ?Help out with chores around the [place]. ?No sex offender of any type (pending, charged, registered and/or any other sex related offenses) will be permitted to check in. ?Must be willing to abide by all rules, regulations, and policies established by the [employer]. ?The following will be provided - shelter, food, clothing, and biblical counseling. ?If you or someone you know is in need of assistance at our [facility] shelter in [place], [place], please call [phone] ext. [phone]. ? ?[place]: ? ? Phone: [phone] ? ?[facility] ?SA Intensive Outpatient Program (SAIOP) means structured individual and group addiction activities and services that are provided  at an outpatient program designed to assist adult and adolescent  consumers to begin recovery and learn skills for recovery maintenance. The [place] program is offered at least 3 hours a day, 3 days a week. SAIOP services shall include a structured program consisting of, but not limited to, the following services: ?Individual counseling and support; Group counseling and support; Family counseling, training or support; Biochemical assays to identify recent drug use (e.g., urine drug screens); Strategies for relapse prevention to include community and social support systems in treatment; Life skills; Crisis contingency planning; Disease Management; and Treatment support activities that have been adapted or specifically designed for persons with physical disabilities, or persons with co-occurring disorders of mental illness and substance abuse/dependence or mental retardation/developmental disability and substance abuse/dependence. ? ?Phone: [phone]  ? ?[facility]) ? ?Address: [place], [place], [place] [address] ?Phone: [phone] ? ? ?[place] ?Address: [address], [place], [place] [address] ?Phone: [phone] ? ?- a combination of group and individual sessions to meet the participants needs. This allows participants to engage in treatment and remain involved in their home and work life. ?- Transitional housing places program participants in a supportive living environment while they complete a treatment program and work to secure independent housing. ?- The Substance Abuse Intensive Outpatient Treatment Program at [facility] consists of structured group sessions and individual sessions that are designed to teach participants early recovery and relapse prevention skills. ?-[facility] works with the [employer] to provide a housing and treatment program for homeless veterans.  ? ?[facility].  ? ?Address: [address] [place], [place] [address] ?Phone#:  [phone]  ? ?: Referrals to RTSA facilities can be made by [facility].  Referrals are also accepted from physicians, private providers, hospital emergency rooms, family members, or any person who has knowledge of someone in the need of our services. ? ?[facility] will also offer the following outpatient services: (Monday through Friday 8am-5pm) ?  ?Partial Hospitalization Program (PHP) ?Substance Abuse Intensive Outpatient Program (SA-IOP) ?Group Therapy ?Medication Management ?Peer Living Room ?We also provide (24/7):  ?Assessments: Our mental health clinician and providers will conduct a focused mental health evaluation, assessing for immediate safety concerns and further mental health needs. ?Referral: Our team will provide resources and help connect to community based mental health treatment, when indicated, including psychotherapy, psychiatry, and other specialized behavioral health or substance use disorder services (for those not already in treatment). ?Transitional Care: Our team providers in person bridging and/or telephonic follow-up during the patient's transition to outpatient services.  ? ?[facility] ?24-Hour Call Center: ?[phone] ?Behavioral Health Crisis Line: ?[phone] ? ?[place], MSW, LCSW-A, LCAS-A ?Phone: [phone] ?Disposition/TOC ? ? ?

## 2021-11-19 NOTE — ED Notes (Signed)
Pt was peas, chicken/dumplings, and juice for lunch. ?

## 2021-12-05 ENCOUNTER — Telehealth (HOSPITAL_COMMUNITY): Payer: Self-pay

## 2021-12-05 NOTE — BH Assessment (Signed)
Care Management - [place] Follow Up Discharges  ? ?Writer attempted to make contact with patient today and was unsuccessful.  Voicemail is not set up. ? ?Per chart review, patient was provided with outpatient substance abuse resources. ? ?

## 2022-02-19 ENCOUNTER — Emergency Department (HOSPITAL_BASED_OUTPATIENT_CLINIC_OR_DEPARTMENT_OTHER): Payer: Self-pay

## 2022-02-19 ENCOUNTER — Emergency Department (HOSPITAL_BASED_OUTPATIENT_CLINIC_OR_DEPARTMENT_OTHER)
Admission: EM | Admit: 2022-02-19 | Discharge: 2022-02-20 | Disposition: A | Discharge status: Home / Self Care | Payer: Self-pay | Attending: Emergency Medicine | Admitting: Emergency Medicine

## 2022-02-19 ENCOUNTER — Encounter (HOSPITAL_BASED_OUTPATIENT_CLINIC_OR_DEPARTMENT_OTHER): Payer: Self-pay | Admitting: Emergency Medicine

## 2022-02-19 ENCOUNTER — Other Ambulatory Visit: Payer: Self-pay

## 2022-02-19 DIAGNOSIS — R569 Unspecified convulsions: Secondary | ICD-10-CM | POA: Insufficient documentation

## 2022-02-19 DIAGNOSIS — Z79899 Other long term (current) drug therapy: Secondary | ICD-10-CM | POA: Insufficient documentation

## 2022-02-19 DIAGNOSIS — R7401 Elevation of levels of liver transaminase levels: Secondary | ICD-10-CM | POA: Insufficient documentation

## 2022-02-19 DIAGNOSIS — R1011 Right upper quadrant pain: Secondary | ICD-10-CM | POA: Insufficient documentation

## 2022-02-19 DIAGNOSIS — E876 Hypokalemia: Secondary | ICD-10-CM | POA: Insufficient documentation

## 2022-02-19 DIAGNOSIS — F10129 Alcohol abuse with intoxication, unspecified: Secondary | ICD-10-CM

## 2022-02-19 DIAGNOSIS — R112 Nausea with vomiting, unspecified: Secondary | ICD-10-CM | POA: Insufficient documentation

## 2022-02-19 DIAGNOSIS — R410 Disorientation, unspecified: Secondary | ICD-10-CM | POA: Insufficient documentation

## 2022-02-19 LAB — COMPREHENSIVE METABOLIC PANEL
ALT: 43 U/L (ref 0–44)
AST: 54 U/L — ABNORMAL HIGH (ref 15–41)
Albumin: 4.6 g/dL (ref 3.5–5.0)
Alkaline Phosphatase: 88 U/L (ref 38–126)
Anion gap: 9 (ref 5–15)
BUN: 7 mg/dL (ref 6–20)
CO2: 30 mmol/L (ref 22–32)
Calcium: 9 mg/dL (ref 8.9–10.3)
Chloride: 102 mmol/L (ref 98–111)
Creatinine, Ser: 0.69 mg/dL (ref 0.61–1.24)
GFR, Estimated: >60 mL/min (ref >60)
Glucose, Bld: 111 mg/dL — ABNORMAL HIGH (ref 70–99)
Potassium: 3.2 mmol/L — ABNORMAL LOW (ref 3.5–5.1)
Sodium: 141 mmol/L (ref 135–145)
Total Bilirubin: 0.6 mg/dL (ref 0.3–1.2)
Total Protein: 7.6 g/dL (ref 6.5–8.1)

## 2022-02-19 LAB — CBC WITH DIFFERENTIAL/PLATELET
Abs Immature Granulocytes: 0.02 10*3/uL (ref 0.00–0.07)
Basophils Absolute: 0.1 10*3/uL (ref 0.0–0.1)
Basophils Relative: 1 %
Eosinophils Absolute: 0.1 10*3/uL (ref 0.0–0.5)
Eosinophils Relative: 2 %
HCT: 47 % (ref 39.0–52.0)
Hemoglobin: 16 g/dL (ref 13.0–17.0)
Immature Granulocytes: 0 %
Lymphocytes Relative: 35 %
Lymphs Abs: 2.4 10*3/uL (ref 0.7–4.0)
MCH: 33.3 pg (ref 26.0–34.0)
MCHC: 34 g/dL (ref 30.0–36.0)
MCV: 97.7 fL (ref 80.0–100.0)
Monocytes Absolute: 0.6 10*3/uL (ref 0.1–1.0)
Monocytes Relative: 9 %
Neutro Abs: 3.6 10*3/uL (ref 1.7–7.7)
Neutrophils Relative %: 53 %
Platelets: 242 10*3/uL (ref 150–400)
RBC: 4.81 MIL/uL (ref 4.22–5.81)
RDW: 13.5 % (ref 11.5–15.5)
WBC: 6.8 10*3/uL (ref 4.0–10.5)
nRBC: 0 % (ref 0.0–0.2)

## 2022-02-19 LAB — LIPASE, BLOOD: Lipase: 48 U/L (ref 11–51)

## 2022-02-19 LAB — ETHANOL: Alcohol, Ethyl (B): 337 mg/dL (ref <10)

## 2022-02-19 LAB — MAGNESIUM: Magnesium: 1.9 mg/dL (ref 1.7–2.4)

## 2022-02-19 MED ORDER — ONDANSETRON HCL 4 MG/2ML IJ SOLN
4.0000 mg | Freq: Once | INTRAMUSCULAR | Status: AC
  Administered 2022-02-20: 4 mg via INTRAVENOUS
  Filled 2022-02-19: qty 2

## 2022-02-19 MED ORDER — SODIUM CHLORIDE 0.9 % IV BOLUS
1000.0000 mL | Freq: Once | INTRAVENOUS | Status: AC
  Administered 2022-02-20: 1000 mL via INTRAVENOUS

## 2022-02-19 MED ORDER — MORPHINE SULFATE (PF) 4 MG/ML IV SOLN
4.0000 mg | Freq: Once | INTRAVENOUS | Status: AC
  Administered 2022-02-20: 4 mg via INTRAVENOUS
  Filled 2022-02-19: qty 1

## 2022-02-19 MED ORDER — POTASSIUM CHLORIDE CRYS ER 20 MEQ PO TBCR
40.0000 meq | EXTENDED_RELEASE_TABLET | Freq: Two times a day (BID) | ORAL | Status: DC
  Administered 2022-02-20: 40 meq via ORAL
  Filled 2022-02-19: qty 2

## 2022-02-19 NOTE — ED Triage Notes (Signed)
Pt states he had a seizure at 0900 and 1500 today. He reports hurting both sides of his head, and his right knee. He states he is a daily ETOH drinker. "3-4 shots per day". 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 Pt is A&O. Seizures were not witnessed.

## 2022-02-19 NOTE — ED Notes (Signed)
Pt sts last ETOH was 2 shots at 1900

## 2022-02-19 NOTE — ED Provider Notes (Addendum)
[facility] Provider Note   CSN: [id number] Arrival date & time: 02/19/22  2143     History PMH: alcohol use disorder, delirium tremens, [facility] Chief Complaint  Patient presents with   Seizures    Charles Graves is a 41 y.o. male.  Presents here with concerns for seizure.  He says that he thinks he had 2 seizures today that were unwitnessed.  He says that earlier this today he was laying in bed and he members waking up on the floor feeling little bit disoriented.  He started feeling better after about 15 minutes.  He then states that he was in his kitchen when he started to feel nauseous and had abdominal pain.  he felt like he was going to pass out at this time.Marland Kitchen  He actually remembers hitting the ground injuring his right knee.  He then remembers waking up on the floor again.  He is a fairly heavy drinker and drinks several shots of liquor daily.  He last drink at 6 PM and has had no recent labs and drinking.  He did have a seizure in the past when he was going through alcohol withdrawal.  He has also had multiple episodes of syncope in the past and says that the symptoms feel similar to that.  Patient states that over the past 2 weeks he has been dealing with right upper quadrant abdominal pain.  This has been constant.  He has had associated nausea and vomiting.  He was evaluated at [facility] emergency department about a week and a half ago and was diagnosed with gallstones.  He is supposed to follow-up with a general surgeon regarding this, but he has not yet made his appointment.   Seizures      Home Medications Prior to Admission medications   Medication Sig Start Date End Date Taking? Authorizing Provider  amLODipine (NORVASC) 10 MG tablet Take 1 tablet (10 mg total) by mouth daily. 04/03/21   Shawna Clamp, MD  Cholecalciferol 25 MCG (1000 UT) tablet Take 1,000 Units by mouth daily. 10/26/21   [provider]  folic acid (FOLVITE) 1 MG  tablet Take 1 tablet (1 mg total) by mouth daily. 11/04/21   Armando Reichert, MD  gabapentin (NEURONTIN) 300 MG capsule Take 300 mg by mouth 3 (three) times daily. 10/26/21   [provider]  hydrALAZINE (APRESOLINE) 25 MG tablet Take 1 tablet (25 mg total) by mouth 2 (two) times daily. 11/19/21   Lucky Rathke, FNP  methocarbamol (ROBAXIN) 500 MG tablet Take 500 mg by mouth 2 (two) times daily. 10/26/21   [provider]  mirtazapine (REMERON) 15 MG tablet Take 15 mg by mouth at bedtime. 10/26/21   [provider]  Multiple Vitamin (MULTIVITAMIN WITH MINERALS) TABS tablet Take 1 tablet by mouth daily.    [provider]  pantoprazole (PROTONIX) 40 MG tablet Take 1 tablet (40 mg total) by mouth daily. 04/03/21   Shawna Clamp, MD  thiamine 100 MG tablet Take 1 tablet (100 mg total) by mouth daily. 11/04/21   Armando Reichert, MD  traZODone (DESYREL) 50 MG tablet Take 50 mg by mouth at bedtime as needed for sleep. 08/10/21   [provider]      Allergies    Oxycodone hcl and Lorazepam    Review of Systems   Review of Systems  Constitutional:  Negative for chills and fever.  Gastrointestinal:  Positive for abdominal pain, nausea and vomiting.  Neurological:  Positive for  syncope. Negative for tremors and seizures.  All other systems reviewed and are negative.   Physical Exam Updated Vital Signs BP (!) 154/116   Pulse 82   Resp (!) 21   Ht '5\' 8"'$  (1.727 m)   Wt 53.5 kg   SpO2 97%   BMI 17.94 kg/m  Physical Exam Vitals and nursing note reviewed.  Constitutional:      General: He is not in acute distress.    Appearance: Normal appearance. He is ill-appearing. He is not toxic-appearing or diaphoretic.     Comments: Chronically ill appearing  HENT:     Head: Normocephalic and atraumatic.     Nose: No nasal deformity.     Mouth/Throat:     Lips: Pink. No lesions.     Mouth: Mucous membranes are moist. No injury, lacerations, oral lesions or  angioedema.     Pharynx: Oropharynx is clear. Uvula midline. No pharyngeal swelling, oropharyngeal exudate, posterior oropharyngeal erythema or uvula swelling.  Eyes:     General: Gaze aligned appropriately. No scleral icterus.       Right eye: No discharge.        Left eye: No discharge.     Conjunctiva/sclera: Conjunctivae normal.     Right eye: Right conjunctiva is not injected. No exudate or hemorrhage.    Left eye: Left conjunctiva is not injected. No exudate or hemorrhage.    Pupils: Pupils are equal, round, and reactive to light.  Cardiovascular:     Rate and Rhythm: Normal rate and regular rhythm.     Pulses: Normal pulses.          Radial pulses are 2+ on the right side and 2+ on the left side.       Dorsalis pedis pulses are 2+ on the right side and 2+ on the left side.     Heart sounds: Normal heart sounds, S1 normal and S2 normal. Heart sounds not distant. No murmur heard.    No friction rub. No gallop. No S3 or S4 sounds.  Pulmonary:     Effort: Pulmonary effort is normal. No accessory muscle usage or respiratory distress.     Breath sounds: Normal breath sounds. No stridor. No wheezing, rhonchi or rales.  Chest:     Chest wall: No tenderness.  Abdominal:     General: Abdomen is flat. There is no distension.     Palpations: Abdomen is soft. There is no mass or pulsatile mass.     Tenderness: There is abdominal tenderness. There is no guarding or rebound.     Comments: RUQ tenderness, Murphy +  Musculoskeletal:     Right lower leg: No edema.     Left lower leg: No edema.  Skin:    General: Skin is warm and dry.     Coloration: Skin is not jaundiced or pale.     Findings: No bruising, erythema, lesion or rash.  Neurological:     General: No focal deficit present.     Mental Status: He is alert and oriented to person, place, and time.     GCS: GCS eye subscore is 4. GCS verbal subscore is 5. GCS motor subscore is 6.  Psychiatric:        Mood and Affect: Mood normal.         Behavior: Behavior normal. Behavior is cooperative.     ED Results / Procedures / Treatments   Labs (all labs ordered are listed, but only abnormal results are displayed) Labs Reviewed  COMPREHENSIVE METABOLIC PANEL - Abnormal; Notable for the following components:      Result Value   Potassium 3.2 (*)    Glucose, Bld 111 (*)    AST 54 (*)    All other components within normal limits  ETHANOL - Abnormal; Notable for the following components:   Alcohol, Ethyl (B) 337 (*)    All other components within normal limits  CBC WITH DIFFERENTIAL/PLATELET  MAGNESIUM  URINALYSIS, ROUTINE W REFLEX MICROSCOPIC  LIPASE, BLOOD    EKG None  Radiology CT Head Wo Contrast  Result Date: 02/19/2022 CLINICAL DATA:  Head trauma, abnormal mental status (Age 83-64y). Seizure. EXAM: CT HEAD WITHOUT CONTRAST TECHNIQUE: Contiguous axial images were obtained from the base of the skull through the vertex without intravenous contrast. RADIATION DOSE REDUCTION: This exam was performed according to the departmental dose-optimization program which includes automated exposure control, adjustment of the mA and/or kV according to patient size and/or use of iterative reconstruction technique. COMPARISON:  11/17/2021 FINDINGS: Brain: No acute intracranial abnormality. Specifically, no hemorrhage, hydrocephalus, mass lesion, acute infarction, or significant intracranial injury. Vascular: No hyperdense vessel or unexpected calcification. Skull: No acute calvarial abnormality. Sinuses/Orbits: No acute findings Other: None IMPRESSION: No acute intracranial abnormality. Electronically Signed   By: Rolm Baptise M.D.   On: 02/19/2022 23:12    Procedures Procedures   Medications Ordered in ED Medications  sodium chloride 0.9 % bolus 1,000 mL (has no administration in time range)  potassium chloride SA (KLOR-CON M) CR tablet 40 mEq (has no administration in time range)  ondansetron (ZOFRAN) injection 4 mg (has no  administration in time range)  morphine (PF) 4 MG/ML injection 4 mg (has no administration in time range)    ED Course/ Medical Decision Making/ A&P                           Medical Decision Making Amount and/or Complexity of Data Reviewed Labs: ordered. Radiology: ordered.  Risk Prescription drug management.    MDM  This is a 41 y.o. male who presents to the ED with concern for seizures and RUQ abdominal pain  My Impression, Plan, and ED Course:  Patient appears chronically ill. Vitals stable. Does not appear to be tremorous or have external signs of withdrawal.  I actually don't think patient had a seizure due to him remembering hitting his knee and having prodromal symptoms that suggest syncope. He did not have post ictal symptoms either. Seems more likely that this was presyncope or syncope. He is getting a head CT and right knee xr to evaluate for injuries. Abdominal labs ordered. He recently had RUQ ultrasound that showed distended gallbladder without stones. He had + Murphy sign on exam. We do not have [place] available today, so will order CT a/p.   So far, labs reveal no leukocytosis, CBC otherwise normal. CMP with mild hypokalemia which appears chronic. AST minimally elevated. No other concerning findings. ETOH 337. CT head without bleed or injury.   12:00 AM Care of [facility] Dy transferred Dr. Francia Greaves at the end of my shift as the patient will require reassessment once labs/imaging have resulted. Patient presentation, ED course, and plan of care discussed with review of all pertinent labs and imaging. Please see his/her note for further details regarding further ED course and disposition. Plan at time of handoff is f/u on labs and imaging. This may be altered or completely changed at the discretion of the oncoming team pending  results of further workup.    Charting Requirements Additional history is obtained from:  Independent historian External Records from outside source  obtained and reviewed including: prior imaging and labs, prior admission from 2022 with withdrawal seizures Social Determinants of Health:  Alcoholism/Drug Addiction Pertinant PMH that complicates patient's illness: alcohol use disorder  Patient Care Problems that were addressed during this visit: - RUQ pain: Acute illness with systemic symptoms - Seizure Concern: Acute illness with complication This patient was maintained on a cardiac monitor/telemetry. I personally viewed and interpreted the cardiac monitor which reveals an underlying rhythm of NSR Medications given in ED: Zofran, morphine, IVF Reevaluation of the patient after these medicines showed that the patient  needs reevaluation I have reviewed home medications and made changes accordingly.  Critical Care Interventions: n/a Consultations: n/a Disposition: see oncoming provider note  This is a shared visit with my attending physician, Dr. Francia Greaves.  We have discussed this patient and they have independently evaluated this patient. The plan was altered or changed as needed.  Portions of this note were generated with [employer]. Dictation errors may occur despite best attempts at proofreading.    Final Clinical Impression(s) / ED Diagnoses Final diagnoses:  None    Rx / DC Orders ED Discharge Orders     None         Adolphus Birchwood, PA-C 02/20/22 0002    Joselle Deeds, Adora Fridge, PA-C 02/20/22 0003    Mesner, Corene Cornea, MD 02/20/22 [phone]

## 2022-02-20 ENCOUNTER — Encounter (HOSPITAL_BASED_OUTPATIENT_CLINIC_OR_DEPARTMENT_OTHER): Payer: Self-pay

## 2022-02-20 ENCOUNTER — Emergency Department (HOSPITAL_BASED_OUTPATIENT_CLINIC_OR_DEPARTMENT_OTHER): Payer: Self-pay

## 2022-02-20 LAB — URINALYSIS, ROUTINE W REFLEX MICROSCOPIC
Bilirubin Urine: NEGATIVE
Glucose, UA: NEGATIVE mg/dL
Hgb urine dipstick: NEGATIVE
Ketones, ur: NEGATIVE mg/dL
Leukocytes,Ua: NEGATIVE
Nitrite: NEGATIVE
Protein, ur: NEGATIVE mg/dL
Specific Gravity, Urine: 1.01 (ref 1.005–1.030)
pH: 6.5 (ref 5.0–8.0)

## 2022-02-20 MED ORDER — IOHEXOL 300 MG/ML  SOLN
100.0000 mL | Freq: Once | INTRAMUSCULAR | Status: AC | PRN
Start: 2022-02-20 — End: 2022-02-20
  Administered 2022-02-20: 100 mL via INTRAVENOUS

## 2022-02-20 NOTE — ED Provider Notes (Signed)
I provided a substantive portion of the care of this patient.  I personally performed the entirety of the history, exam, and medical decision making for this encounter.  Workup unremarkable. Held in ED until clinically sober and the point where his EtOH should be below the legal limit. Suspect he had a fall possibly related to intoxication, doubt true seizure. Possibly syncope but his workup was unremarkable aside from mild hypo-K likely related to drinking. Will fu w/ pcp for further management of same.   EKG Interpretation  Date/Time:  Sunday February 19 2022 21:55:31 EDT Ventricular Rate:  88 PR Interval:  180 QRS Duration: 103 QT Interval:  368 QTC Calculation: 446 R Axis:   58 Text Interpretation: Sinus rhythm Confirmed by Merrily Pew [phone]) on 02/19/2022 11:34:29 PM     Isidora Laham, Corene Cornea, MD 02/20/22 [phone]

## 2022-03-06 ENCOUNTER — Emergency Department (HOSPITAL_BASED_OUTPATIENT_CLINIC_OR_DEPARTMENT_OTHER)
Admission: EM | Admit: 2022-03-06 | Discharge: 2022-03-06 | Disposition: A | Discharge status: Home / Self Care | Payer: Self-pay | Attending: Emergency Medicine | Admitting: Emergency Medicine

## 2022-03-06 ENCOUNTER — Other Ambulatory Visit: Payer: Self-pay

## 2022-03-06 ENCOUNTER — Other Ambulatory Visit (HOSPITAL_BASED_OUTPATIENT_CLINIC_OR_DEPARTMENT_OTHER): Payer: Self-pay

## 2022-03-06 ENCOUNTER — Encounter (HOSPITAL_BASED_OUTPATIENT_CLINIC_OR_DEPARTMENT_OTHER): Payer: Self-pay | Admitting: Emergency Medicine

## 2022-03-06 DIAGNOSIS — R040 Epistaxis: Secondary | ICD-10-CM | POA: Insufficient documentation

## 2022-03-06 MED ORDER — AMOXICILLIN-POT CLAVULANATE 875-125 MG PO TABS
1.0000 | ORAL_TABLET | Freq: Two times a day (BID) | ORAL | 0 refills | Status: DC
  Filled 2022-03-06: qty 14, 7d supply, fill #0

## 2022-03-06 MED ORDER — OXYMETAZOLINE HCL 0.05 % NA SOLN
1.0000 | Freq: Once | NASAL | Status: AC
  Administered 2022-03-06: 1 via NASAL
  Filled 2022-03-06: qty 30

## 2022-03-06 MED ORDER — TRANEXAMIC ACID 1000 MG/10ML IV SOLN
500.0000 mg | Freq: Once | INTRAVENOUS | Status: AC
Start: 2022-03-06 — End: 2022-03-06
  Administered 2022-03-06: 500 mg via TOPICAL
  Filled 2022-03-06: qty 10

## 2022-03-06 NOTE — ED Provider Notes (Signed)
[facility] Provider Note   CSN: [id number] Arrival date & time: 03/06/22  1249     History  Chief Complaint  Patient presents with   Epistaxis    Charles Graves is a 41 y.o. male.  41 year old male brought in by EMS for nosebleed.  Patient states that he woke up this morning was doing his usual routine, sneezed and had sudden onset of heavy bleeding from the left side of his nose.  EMS was called, Afrin was applied and bleeding persisted.  Patient is not on any blood thinners.  Patient did not take his regular blood pressure medications this morning.       Home Medications Prior to Admission medications   Medication Sig Start Date End Date Taking? Authorizing Provider  amoxicillin-clavulanate (AUGMENTIN) 875-125 MG tablet Take 1 tablet by mouth every 12 (twelve) hours. 03/06/22  Yes Tacy Learn, PA-C  amLODipine (NORVASC) 10 MG tablet Take 1 tablet (10 mg total) by mouth daily. 04/03/21   Shawna Clamp, MD  Cholecalciferol 25 MCG (1000 UT) tablet Take 1,000 Units by mouth daily. 10/26/21   [provider]  folic acid (FOLVITE) 1 MG tablet Take 1 tablet (1 mg total) by mouth daily. 11/04/21   Armando Reichert, MD  gabapentin (NEURONTIN) 300 MG capsule Take 300 mg by mouth 3 (three) times daily. 10/26/21   [provider]  hydrALAZINE (APRESOLINE) 25 MG tablet Take 1 tablet (25 mg total) by mouth 2 (two) times daily. 11/19/21   Lucky Rathke, FNP  methocarbamol (ROBAXIN) 500 MG tablet Take 500 mg by mouth 2 (two) times daily. 10/26/21   [provider]  mirtazapine (REMERON) 15 MG tablet Take 15 mg by mouth at bedtime. 10/26/21   [provider]  Multiple Vitamin (MULTIVITAMIN WITH MINERALS) TABS tablet Take 1 tablet by mouth daily.    [provider]  pantoprazole (PROTONIX) 40 MG tablet Take 1 tablet (40 mg total) by mouth daily. 04/03/21   Shawna Clamp, MD  thiamine 100 MG tablet Take 1 tablet (100 mg total) by  mouth daily. 11/04/21   Armando Reichert, MD  traZODone (DESYREL) 50 MG tablet Take 50 mg by mouth at bedtime as needed for sleep. 08/10/21   [provider]      Allergies    Oxycodone hcl and Lorazepam    Review of Systems   Review of Systems Negative except as per HPI Physical Exam Updated Vital Signs BP (!) 166/124   Pulse 76   Temp 97.9 F (36.6 C) (Oral)   Resp 16   SpO2 98%  Physical Exam Vitals and nursing note reviewed.  Constitutional:      General: He is not in acute distress.    Appearance: He is well-developed. He is not diaphoretic.  HENT:     Head: Normocephalic and atraumatic.     Nose:     Comments: Active bleeding from left side nose Pulmonary:     Effort: Pulmonary effort is normal.  Skin:    General: Skin is warm and dry.     Findings: No erythema or rash.  Neurological:     Mental Status: He is alert and oriented to person, place, and time.  Psychiatric:        Behavior: Behavior normal.     ED Results / Procedures / Treatments   Labs (all labs ordered are listed, but only abnormal results are displayed) Labs Reviewed - No data to display  EKG None  Radiology No results found.  Procedures .Epistaxis Management  Date/Time: 03/06/2022 3:42 PM  Performed by: Tacy Learn, PA-C Authorized by: Tacy Learn, PA-C   Consent:    Consent obtained:  Verbal   Consent given by:  Patient   Risks, benefits, and alternatives were discussed: yes     Risks discussed:  Bleeding, infection, nasal injury and pain   Alternatives discussed:  No treatment Universal protocol:    Patient identity confirmed:  Verbally with patient Anesthesia:    Anesthesia method:  None Procedure details:    Treatment site:  L anterior   Treatment method:  Anterior pack   Treatment complexity:  Extensive   Treatment episode: initial   Post-procedure details:    Assessment:  No improvement   Procedure completion:  Tolerated .Epistaxis  Management  Date/Time: 03/06/2022 3:43 PM  Performed by: Tacy Learn, PA-C Authorized by: Tacy Learn, PA-C   Consent:    Consent obtained:  Verbal   Consent given by:  Patient   Risks, benefits, and alternatives were discussed: yes     Risks discussed:  Infection, nasal injury, pain and bleeding   Alternatives discussed:  No treatment Universal protocol:    Patient identity confirmed:  Verbally with patient Anesthesia:    Anesthesia method:  None Procedure details:    Treatment site:  L anterior   Treatment method:  Nasal balloon   Treatment complexity:  Extensive   Treatment episode: recurring   Post-procedure details:    Assessment:  Bleeding stopped   Procedure completion:  Tolerated     Medications Ordered in ED Medications  oxymetazoline (AFRIN) 0.05 % nasal spray 1 spray (1 spray Each Nare Given 03/06/22 1321)  tranexamic acid (CYKLOKAPRON) injection 500 mg (500 mg Topical Given 03/06/22 1353)    ED Course/ Medical Decision Making/ A&P                           Medical Decision Making Risk OTC drugs. Prescription drug management.   This patient presents to the ED for concern of epistaxis, this involves an extensive number of treatment options, and is a complaint that carries with it a high risk of complications and morbidity.  The differential diagnosis includes but not limited to cause secondary to hypertension versus alcohol use   Co morbidities that complicate the patient evaluation  History of alcohol abuse   Additional history obtained:  External records from outside source obtained and reviewed including recent labs from 01/20/2022 including CBC with normal H&H, normal platelets.   Consultations Obtained:  I requested consultation with the ER attending, Dr. Almyra Free,  and discussed lab and imaging findings as well as pertinent plan - they recommend: Pack with tongue depressor for pressure, consider use of 2 Rhino Rocket's if needed.   Problem  List / ED Course / Critical interventions / Medication management  41 year old male brought in by EMS for epistaxis which started this morning after he sneezed.  Patient is found to have constant bleeding from the left nare described as dripping.  Patient was asked to blow his nose, Afrin was applied to both sides and consistent pressure was held for 15 minutes.  On recheck, patient was found to have ongoing bleeding.  Gauze soaked in TXA was then packed into the left side of the nose.  Recheck after 20 minutes, found to have ongoing bleeding.  This gauze was removed, a Rhino Rocket was placed on the left  side.  Initially, had some bleeding from the right side which quickly resolved.  On recheck, has blood-tinged watery drainage from the left side of nose which is minimal at best.  Patient is requesting discharge at this time.  Did consider placing secondary packing to right side, patient declines.  Patient is given prescription for Augmentin as he is being discharged with nasal packing in place and is referred to ENT for follow-up in 1 to 2 days for removal of packing. I have reviewed the patients home medicines and have made adjustments as needed   Social Determinants of Health:  Lives in a local motel, no PCP   Test / Admission - Considered:  Consider lab check however bleeding is controlled, blood pressure remains elevated although he has not taken his regular blood pressure medications today, is not tachycardic.         Final Clinical Impression(s) / ED Diagnoses Final diagnoses:  Left-sided epistaxis    Rx / DC Orders ED Discharge Orders          Ordered    amoxicillin-clavulanate (AUGMENTIN) 875-125 MG tablet  Every 12 hours        03/06/22 1541              Roque Lias 03/06/22 1724    Luna Fuse, MD 03/11/22 [phone]

## 2022-03-06 NOTE — ED Notes (Signed)
Patient given cab voucher and instructions.

## 2022-03-06 NOTE — ED Triage Notes (Signed)
Patient here with nosebleed. Brought in by [facility]. Unable to control bleeding with ice, pressure and afrin.

## 2022-03-13 ENCOUNTER — Other Ambulatory Visit (HOSPITAL_BASED_OUTPATIENT_CLINIC_OR_DEPARTMENT_OTHER): Payer: Self-pay

## 2022-04-09 ENCOUNTER — Encounter (HOSPITAL_COMMUNITY): Payer: Self-pay

## 2022-04-09 ENCOUNTER — Emergency Department (HOSPITAL_COMMUNITY)
Admission: EM | Admit: 2022-04-09 | Discharge: 2022-04-09 | Disposition: A | Discharge status: Home / Self Care | Payer: Self-pay | Attending: Emergency Medicine | Admitting: Emergency Medicine

## 2022-04-09 ENCOUNTER — Other Ambulatory Visit: Payer: Self-pay

## 2022-04-09 DIAGNOSIS — R109 Unspecified abdominal pain: Secondary | ICD-10-CM | POA: Insufficient documentation

## 2022-04-09 DIAGNOSIS — F1090 Alcohol use, unspecified, uncomplicated: Secondary | ICD-10-CM | POA: Insufficient documentation

## 2022-04-09 DIAGNOSIS — R569 Unspecified convulsions: Secondary | ICD-10-CM | POA: Insufficient documentation

## 2022-04-09 DIAGNOSIS — Y908 Blood alcohol level of 240 mg/100 ml or more: Secondary | ICD-10-CM | POA: Insufficient documentation

## 2022-04-09 LAB — CBC WITH DIFFERENTIAL/PLATELET
Abs Immature Granulocytes: 0.01 10*3/uL (ref 0.00–0.07)
Basophils Absolute: 0 10*3/uL (ref 0.0–0.1)
Basophils Relative: 1 %
Eosinophils Absolute: 0.1 10*3/uL (ref 0.0–0.5)
Eosinophils Relative: 2 %
HCT: 46.1 % (ref 39.0–52.0)
Hemoglobin: 15.5 g/dL (ref 13.0–17.0)
Immature Granulocytes: 0 %
Lymphocytes Relative: 41 %
Lymphs Abs: 1.8 10*3/uL (ref 0.7–4.0)
MCH: 33.2 pg (ref 26.0–34.0)
MCHC: 33.6 g/dL (ref 30.0–36.0)
MCV: 98.7 fL (ref 80.0–100.0)
Monocytes Absolute: 0.4 10*3/uL (ref 0.1–1.0)
Monocytes Relative: 9 %
Neutro Abs: 2 10*3/uL (ref 1.7–7.7)
Neutrophils Relative %: 47 %
Platelets: 175 10*3/uL (ref 150–400)
RBC: 4.67 MIL/uL (ref 4.22–5.81)
RDW: 12.5 % (ref 11.5–15.5)
WBC: 4.4 10*3/uL (ref 4.0–10.5)
nRBC: 0 % (ref 0.0–0.2)

## 2022-04-09 LAB — COMPREHENSIVE METABOLIC PANEL
ALT: 56 U/L — ABNORMAL HIGH (ref 0–44)
AST: 57 U/L — ABNORMAL HIGH (ref 15–41)
Albumin: 4.6 g/dL (ref 3.5–5.0)
Alkaline Phosphatase: 90 U/L (ref 38–126)
Anion gap: 11 (ref 5–15)
BUN: 9 mg/dL (ref 6–20)
CO2: 26 mmol/L (ref 22–32)
Calcium: 9.1 mg/dL (ref 8.9–10.3)
Chloride: 106 mmol/L (ref 98–111)
Creatinine, Ser: 0.7 mg/dL (ref 0.61–1.24)
GFR, Estimated: >60 mL/min (ref >60)
Glucose, Bld: 99 mg/dL (ref 70–99)
Potassium: 3.4 mmol/L — ABNORMAL LOW (ref 3.5–5.1)
Sodium: 143 mmol/L (ref 135–145)
Total Bilirubin: 0.6 mg/dL (ref 0.3–1.2)
Total Protein: 7.9 g/dL (ref 6.5–8.1)

## 2022-04-09 LAB — RAPID URINE DRUG SCREEN, HOSP PERFORMED
Amphetamines: NOT DETECTED
Barbiturates: NOT DETECTED
Benzodiazepines: POSITIVE — AB
Cocaine: NOT DETECTED
Opiates: NOT DETECTED
Tetrahydrocannabinol: NOT DETECTED

## 2022-04-09 LAB — ETHANOL: Alcohol, Ethyl (B): 289 mg/dL — ABNORMAL HIGH (ref <10)

## 2022-04-09 LAB — LIPASE, BLOOD: Lipase: 31 U/L (ref 11–51)

## 2022-04-09 MED ORDER — SODIUM CHLORIDE 0.9 % IV BOLUS
1000.0000 mL | Freq: Once | INTRAVENOUS | Status: AC
Start: 2022-04-09 — End: 2022-04-09
  Administered 2022-04-09: 1000 mL via INTRAVENOUS

## 2022-04-09 MED ORDER — LORAZEPAM 2 MG/ML IJ SOLN
1.0000 mg | Freq: Once | INTRAMUSCULAR | Status: AC
Start: 2022-04-09 — End: 2022-04-09
  Administered 2022-04-09: 1 mg via INTRAVENOUS
  Filled 2022-04-09: qty 1

## 2022-04-09 MED ORDER — ONDANSETRON HCL 4 MG/2ML IJ SOLN
4.0000 mg | Freq: Once | INTRAMUSCULAR | Status: AC
  Administered 2022-04-09: 4 mg via INTRAVENOUS
  Filled 2022-04-09: qty 2

## 2022-04-09 MED ORDER — CHLORDIAZEPOXIDE HCL 25 MG PO CAPS: ORAL_CAPSULE | ORAL | 0 refills | Status: DC

## 2022-04-09 MED ORDER — ONDANSETRON 4 MG PO TBDP: 4.0000 mg | ORAL_TABLET | Freq: Three times a day (TID) | ORAL | 0 refills | Status: DC | PRN

## 2022-04-09 MED ORDER — FAMOTIDINE 20 MG PO TABS: 20.0000 mg | ORAL_TABLET | Freq: Two times a day (BID) | ORAL | 0 refills | Status: DC

## 2022-04-09 NOTE — ED Provider Notes (Signed)
[facility] DEPT Provider Note   CSN: [id number] Arrival date & time: 04/09/22  0410     History  Chief Complaint  Patient presents with   Seizures    Charles Graves is a 41 y.o. male with history of chronic alcohol use presenting emergency department with report of possible seizure activity.  Patient reports that his last drink was yesterday evening.  He drinks alcohol and beer frequently.  He reports he does have withdrawal symptoms when he stops drinking.  He says he was in a hotel room and felt that he had a seizure, says that he was foaming at the mouth and his arms and legs were shaking, he was awake during his episode.  He now reports abdominal pain, nausea, no vomiting.  He says he has had similar episodes in the past.  He is trying to quit drinking.  Patient has been several times in the ED for alcohol withdrawal  HPI     Home Medications Prior to Admission medications   Medication Sig Start Date End Date Taking? Authorizing Provider  chlordiazePOXIDE (LIBRIUM) 25 MG capsule '50mg'$  PO TID x 1D, then 25-'50mg'$  PO BID X 1D, then 25-'50mg'$  PO QD X 1D 04/09/22  Yes Anatalia Kronk, Carola Rhine, MD  famotidine (PEPCID) 20 MG tablet Take 1 tablet (20 mg total) by mouth 2 (two) times daily. 04/09/22 05/09/22 Yes Emilee Market, Carola Rhine, MD  ondansetron (ZOFRAN-ODT) 4 MG disintegrating tablet Take 1 tablet (4 mg total) by mouth every 8 (eight) hours as needed for up to 10 doses for nausea or vomiting. 04/09/22  Yes Jamekia Gannett, Carola Rhine, MD  amLODipine (NORVASC) 10 MG tablet Take 1 tablet (10 mg total) by mouth daily. 04/03/21   Shawna Clamp, MD  amoxicillin-clavulanate (AUGMENTIN) 875-125 MG tablet Take 1 tablet by mouth every 12 (twelve) hours. 03/06/22   Tacy Learn, PA-C  Cholecalciferol 25 MCG (1000 UT) tablet Take 1,000 Units by mouth daily. 10/26/21   [provider]  folic acid (FOLVITE) 1 MG tablet Take 1 tablet (1 mg total) by mouth daily. 11/04/21   Armando Reichert,  MD  gabapentin (NEURONTIN) 300 MG capsule Take 300 mg by mouth 3 (three) times daily. 10/26/21   [provider]  hydrALAZINE (APRESOLINE) 25 MG tablet Take 1 tablet (25 mg total) by mouth 2 (two) times daily. 11/19/21   Lucky Rathke, FNP  methocarbamol (ROBAXIN) 500 MG tablet Take 500 mg by mouth 2 (two) times daily. 10/26/21   [provider]  mirtazapine (REMERON) 15 MG tablet Take 15 mg by mouth at bedtime. 10/26/21   [provider]  Multiple Vitamin (MULTIVITAMIN WITH MINERALS) TABS tablet Take 1 tablet by mouth daily.    [provider]  pantoprazole (PROTONIX) 40 MG tablet Take 1 tablet (40 mg total) by mouth daily. 04/03/21   Shawna Clamp, MD  thiamine 100 MG tablet Take 1 tablet (100 mg total) by mouth daily. 11/04/21   Armando Reichert, MD  traZODone (DESYREL) 50 MG tablet Take 50 mg by mouth at bedtime as needed for sleep. 08/10/21   [provider]      Allergies    Oxycodone hcl, Lorazepam, and Oxycodone    Review of Systems   Review of Systems  Physical Exam Updated Vital Signs BP 129/83   Pulse 73   Temp 97.9 F (36.6 C) (Oral)   Resp 10   Ht '5\' 8"'$  (1.727 m)   Wt 54.4 kg   SpO2 99%  BMI 18.25 kg/m  Physical Exam Constitutional:      General: He is not in acute distress. HENT:     Head: Normocephalic and atraumatic.  Eyes:     Conjunctiva/sclera: Conjunctivae normal.     Pupils: Pupils are equal, round, and reactive to light.  Cardiovascular:     Rate and Rhythm: Normal rate and regular rhythm.  Pulmonary:     Effort: Pulmonary effort is normal. No respiratory distress.  Abdominal:     General: There is no distension.     Tenderness: There is abdominal tenderness.  Skin:    General: Skin is warm and dry.  Neurological:     General: No focal deficit present.     Mental Status: He is alert. Mental status is at baseline.  Psychiatric:        Mood and Affect: Mood normal.        Behavior: Behavior normal.     ED  Results / Procedures / Treatments   Labs (all labs ordered are listed, but only abnormal results are displayed) Labs Reviewed  COMPREHENSIVE METABOLIC PANEL - Abnormal; Notable for the following components:      Result Value   Potassium 3.4 (*)    AST 57 (*)    ALT 56 (*)    All other components within normal limits  RAPID URINE DRUG SCREEN, HOSP PERFORMED - Abnormal; Notable for the following components:   Benzodiazepines POSITIVE (*)    All other components within normal limits  ETHANOL - Abnormal; Notable for the following components:   Alcohol, Ethyl (B) 289 (*)    All other components within normal limits  CBC WITH DIFFERENTIAL/PLATELET  LIPASE, BLOOD    EKG None  Radiology No results found.  Procedures Procedures    Medications Ordered in ED Medications  sodium chloride 0.9 % bolus 1,000 mL (0 mLs Intravenous Stopped 04/09/22 1108)  LORazepam (ATIVAN) injection 1 mg (1 mg Intravenous Given 04/09/22 1002)  ondansetron (ZOFRAN) injection 4 mg (4 mg Intravenous Given 04/09/22 0959)    ED Course/ Medical Decision Making/ A&P Clinical Course as of 04/09/22 1221  Sun Apr 09, 2022  1120 Lipase wnl.  Tolerated PO [MT]  1216 Patient is feeling better with his medications.  Will discharge with Librium prescription [MT]    Clinical Course User Index [MT] Zakir Henner, Carola Rhine, MD                           Medical Decision Making Risk Prescription drug management.   This patient presents to the ED with concern for seizure-like activity and abdominal pain.. This involves an extensive number of treatment options, and is a complaint that carries with it a high risk of complications and morbidity.    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  He does have a persistent documented history of heavy  alcohol consumption.  His EtOH level is elevated on arrival today.  He is at high risk for alcohol withdrawal seizures.  At this time, on my exam after 5 hours wait in the waiting room, he does not show a high CIWA score evidence of acute withdrawal.  This abdominal pain is mild could be related to gastritis.  No focal tenderness.  History of appendectomy per his report.  I do not believe we  need emergent CT imaging at this time, but would like to check a lipase level.  Lipase level within normal limits  I ordered and personally interpreted labs.  The pertinent results include: EtOH level 289, UDS positive for benzos, CMP and CBC unremarkable.   The patient was maintained on a cardiac monitor.  I personally viewed and interpreted the cardiac monitored which showed an underlying rhythm of: Normal sinus rhythm  I ordered medication including Ativan, Zofran IV fluids    After the interventions noted above, I reevaluated the patient and found that they have: improved  Social Determinants of Health:Counseled about alcohol cessation, as a risk factor for both alcohol withdrawal and seizures.  Dispostion:  After consideration of the diagnostic results and the patients response to treatment, I feel that the patent would benefit from outpatient follow-up.         Final Clinical Impression(s) / ED Diagnoses Final diagnoses:  Seizure-like activity ([place])  Alcohol use disorder    Rx / DC Orders ED Discharge Orders          Ordered    chlordiazePOXIDE (LIBRIUM) 25 MG capsule        04/09/22 1218    ondansetron (ZOFRAN-ODT) 4 MG disintegrating tablet  Every 8 hours PRN        04/09/22 1218    famotidine (PEPCID) 20 MG tablet  2 times daily        04/09/22 1218              Wyvonnia Dusky, MD 04/09/22 1221

## 2022-04-09 NOTE — ED Notes (Signed)
Pt able to drink water

## 2022-04-09 NOTE — ED Triage Notes (Addendum)
Arrives EMS from motel after calling out saying he woke up on the floor and was having tonic - clonic seizure activity.   Reports hx of similar seizures when withdrawaling from alcohol.  Usually drinks several pints of bourbon a day. Says he has not had a drink in 2 days until last night where he had a few before bed.   Also sts generalized abdominal pain x 2 weeks. Nausea but denies vomiting or diarrhea.

## 2022-05-02 ENCOUNTER — Other Ambulatory Visit: Payer: Self-pay

## 2022-05-02 ENCOUNTER — Emergency Department (HOSPITAL_COMMUNITY): Payer: Self-pay

## 2022-05-02 ENCOUNTER — Emergency Department (HOSPITAL_BASED_OUTPATIENT_CLINIC_OR_DEPARTMENT_OTHER)
Admission: EM | Admit: 2022-05-02 | Discharge: 2022-05-02 | Discharge status: Against Medical Advice | Payer: Self-pay | Attending: Emergency Medicine | Admitting: Emergency Medicine

## 2022-05-02 ENCOUNTER — Emergency Department (HOSPITAL_COMMUNITY)
Admission: EM | Admit: 2022-05-02 | Discharge: 2022-05-03 | Disposition: A | Discharge status: Home / Self Care | Payer: Self-pay | Attending: Emergency Medicine | Admitting: Emergency Medicine

## 2022-05-02 DIAGNOSIS — R251 Tremor, unspecified: Secondary | ICD-10-CM | POA: Insufficient documentation

## 2022-05-02 DIAGNOSIS — Z79899 Other long term (current) drug therapy: Secondary | ICD-10-CM | POA: Insufficient documentation

## 2022-05-02 DIAGNOSIS — Z5321 Procedure and treatment not carried out due to patient leaving prior to being seen by health care provider: Secondary | ICD-10-CM | POA: Insufficient documentation

## 2022-05-02 DIAGNOSIS — R110 Nausea: Secondary | ICD-10-CM | POA: Insufficient documentation

## 2022-05-02 DIAGNOSIS — F1010 Alcohol abuse, uncomplicated: Secondary | ICD-10-CM | POA: Insufficient documentation

## 2022-05-02 DIAGNOSIS — R109 Unspecified abdominal pain: Secondary | ICD-10-CM | POA: Insufficient documentation

## 2022-05-02 DIAGNOSIS — R0602 Shortness of breath: Secondary | ICD-10-CM | POA: Insufficient documentation

## 2022-05-02 DIAGNOSIS — I10 Essential (primary) hypertension: Secondary | ICD-10-CM | POA: Insufficient documentation

## 2022-05-02 DIAGNOSIS — R079 Chest pain, unspecified: Secondary | ICD-10-CM | POA: Insufficient documentation

## 2022-05-02 LAB — CBG MONITORING, ED: Glucose-Capillary: 86 mg/dL (ref 70–99)

## 2022-05-02 NOTE — ED Provider Triage Note (Signed)
Emergency Medicine Provider Triage Evaluation Note  Charles Graves , a 41 y.o. male  was evaluated in triage.  Pt complains of chest pain that started 2 hours PTA. Central chest and the generalized abdomen are hurting, has had associated N/V/D. States he has felt confused today, unsure of the date/year. Denies change in vision, numbness, or focal weakness. Per EMS roommate reported he had syncope x 2 today.   Review of Systems  Per above.   Physical Exam  There were no vitals taken for this visit. Gen:   Awake, no distress   Resp:  Normal effort  MSK:   Moves extremities without difficulty  Other:  PERRL. Oriented to person, disoriented to year. Mild generalized abdominal TTP. 2+ symmetric radial pulses.   Medical Decision Making  Medically screening exam initiated at 10:46 PM.  Appropriate orders placed.  Vineet Kinney was informed that the remainder of the evaluation will be completed by another provider, this initial triage assessment does not replace that evaluation, and the importance of remaining in the ED until their evaluation is complete.  Chest pain   Amaryllis Dyke, PA-C 05/09/22 0037

## 2022-05-02 NOTE — ED Triage Notes (Signed)
Pt c/o CP & abd pain today, EMS reports via roommate, he's passed out twice. EMS reports repetitive questioning.   1NTG, 324ASA  20LAC 150/70 HR 90

## 2022-05-02 NOTE — ED Triage Notes (Addendum)
Chronic abdominal pain , alcohol abuse. Last drink 2 hours ago. Seen multiple times for similar issue. Hx gastritis from chart.  Pt getting verbally aggressive toward this RN  stating , nobody can help him getting better .   Pt reports leaving to another hospital.  Pt appears intoxicated .

## 2022-05-03 ENCOUNTER — Emergency Department (HOSPITAL_COMMUNITY): Payer: Self-pay

## 2022-05-03 ENCOUNTER — Encounter (HOSPITAL_COMMUNITY): Payer: Self-pay

## 2022-05-03 LAB — COMPREHENSIVE METABOLIC PANEL
ALT: 35 U/L (ref 0–44)
AST: 39 U/L (ref 15–41)
Albumin: 4.2 g/dL (ref 3.5–5.0)
Alkaline Phosphatase: 74 U/L (ref 38–126)
Anion gap: 11 (ref 5–15)
BUN: 5 mg/dL — ABNORMAL LOW (ref 6–20)
CO2: 29 mmol/L (ref 22–32)
Calcium: 8.9 mg/dL (ref 8.9–10.3)
Chloride: 106 mmol/L (ref 98–111)
Creatinine, Ser: 0.81 mg/dL (ref 0.61–1.24)
GFR, Estimated: >60 mL/min (ref >60)
Glucose, Bld: 98 mg/dL (ref 70–99)
Potassium: 3.9 mmol/L (ref 3.5–5.1)
Sodium: 146 mmol/L — ABNORMAL HIGH (ref 135–145)
Total Bilirubin: 0.6 mg/dL (ref 0.3–1.2)
Total Protein: 6.8 g/dL (ref 6.5–8.1)

## 2022-05-03 LAB — CBC WITH DIFFERENTIAL/PLATELET
Abs Immature Granulocytes: 0.01 10*3/uL (ref 0.00–0.07)
Basophils Absolute: 0.1 10*3/uL (ref 0.0–0.1)
Basophils Relative: 1 %
Eosinophils Absolute: 0.1 10*3/uL (ref 0.0–0.5)
Eosinophils Relative: 2 %
HCT: 47.1 % (ref 39.0–52.0)
Hemoglobin: 15.7 g/dL (ref 13.0–17.0)
Immature Granulocytes: 0 %
Lymphocytes Relative: 42 %
Lymphs Abs: 2.3 10*3/uL (ref 0.7–4.0)
MCH: 32.6 pg (ref 26.0–34.0)
MCHC: 33.3 g/dL (ref 30.0–36.0)
MCV: 97.9 fL (ref 80.0–100.0)
Monocytes Absolute: 0.3 10*3/uL (ref 0.1–1.0)
Monocytes Relative: 6 %
Neutro Abs: 2.6 10*3/uL (ref 1.7–7.7)
Neutrophils Relative %: 49 %
Platelets: 233 10*3/uL (ref 150–400)
RBC: 4.81 MIL/uL (ref 4.22–5.81)
RDW: 12.3 % (ref 11.5–15.5)
WBC: 5.5 10*3/uL (ref 4.0–10.5)
nRBC: 0 % (ref 0.0–0.2)

## 2022-05-03 LAB — LIPASE, BLOOD: Lipase: 44 U/L (ref 11–51)

## 2022-05-03 LAB — TROPONIN I (HIGH SENSITIVITY)
Troponin I (High Sensitivity): 5 ng/L (ref <18)
Troponin I (High Sensitivity): 4 ng/L (ref <18)

## 2022-05-03 LAB — ETHANOL: Alcohol, Ethyl (B): 327 mg/dL (ref <10)

## 2022-05-03 LAB — AMMONIA: Ammonia: 20 umol/L (ref 9–35)

## 2022-05-03 MED ORDER — LORAZEPAM 2 MG/ML IJ SOLN
1.0000 mg | Freq: Once | INTRAMUSCULAR | Status: AC
Start: 2022-05-03 — End: 2022-05-03
  Administered 2022-05-03: 1 mg via INTRAVENOUS
  Filled 2022-05-03: qty 1

## 2022-05-03 MED ORDER — ALUM & MAG HYDROXIDE-SIMETH 200-200-20 MG/5ML PO SUSP
30.0000 mL | Freq: Once | ORAL | Status: AC
  Administered 2022-05-03: 30 mL via ORAL
  Filled 2022-05-03: qty 30

## 2022-05-03 MED ORDER — FAMOTIDINE 20 MG PO TABS
20.0000 mg | ORAL_TABLET | Freq: Once | ORAL | Status: AC
  Administered 2022-05-03: 20 mg via ORAL
  Filled 2022-05-03: qty 1

## 2022-05-03 MED ORDER — IOHEXOL 350 MG/ML SOLN
100.0000 mL | Freq: Once | INTRAVENOUS | Status: AC | PRN
  Administered 2022-05-03: 100 mL via INTRAVENOUS

## 2022-05-03 MED ORDER — ONDANSETRON HCL 4 MG/2ML IJ SOLN
4.0000 mg | Freq: Once | INTRAMUSCULAR | Status: AC
  Administered 2022-05-03: 4 mg via INTRAVENOUS
  Filled 2022-05-03: qty 2

## 2022-05-03 NOTE — ED Notes (Signed)
Pt ambulating with steady gait. Dicharge instructoins reviewed and questions answered. Pt verbalizes understanding. Pt ambulated out of facility in stable condition with all belongings

## 2022-05-03 NOTE — ED Provider Notes (Signed)
[facility] EMERGENCY DEPARTMENT Provider Note   CSN: [id number] Arrival date & time: 05/02/22  2245     History  Chief Complaint  Patient presents with   Chest Pain    Amor Packard is a 41 y.o. male with PMH of malingering, alcohol abuse, and hypertension presenting with chest pain. Started two days ago while lying down. Chest pain located on left side, radiates down left arm, left jaw, both shoulders, abdomen, and left leg. Also endorses SOB. Pain feels like "clinched fist". Drank gatorade which helped a little. Also reports that he drank at least a "pint of liquor" late last night. Medical history includes hypertension and seizures that occur when he is not drinking. Patient also nauseas but denies vomiting.    Chest Pain      Home Medications Prior to Admission medications   Medication Sig Start Date End Date Taking? Authorizing Provider  amLODipine (NORVASC) 10 MG tablet Take 1 tablet (10 mg total) by mouth daily. 04/03/21  Yes Shawna Clamp, MD  Cholecalciferol 25 MCG (1000 UT) tablet Take 1,000 Units by mouth daily. 10/26/21  Yes [provider]  famotidine (PEPCID) 20 MG tablet Take 1 tablet (20 mg total) by mouth 2 (two) times daily. 04/09/22 05/09/22 Yes Trifan, Carola Rhine, MD  folic acid (FOLVITE) 1 MG tablet Take 1 tablet (1 mg total) by mouth daily. 11/04/21  Yes Doda, Edd Arbour, MD  gabapentin (NEURONTIN) 300 MG capsule Take 300 mg by mouth 3 (three) times daily. 10/26/21  Yes [provider]  methocarbamol (ROBAXIN) 500 MG tablet Take 500 mg by mouth 2 (two) times daily. 10/26/21  Yes [provider]  mirtazapine (REMERON) 15 MG tablet Take 15 mg by mouth at bedtime. 10/26/21  Yes [provider]  Multiple Vitamin (MULTIVITAMIN WITH MINERALS) TABS tablet Take 1 tablet by mouth daily.   Yes [provider]  pantoprazole (PROTONIX) 40 MG tablet Take 1 tablet (40 mg total) by mouth daily. 04/03/21  Yes Shawna Clamp, MD   QUEtiapine (SEROQUEL) 50 MG tablet Take 50 mg by mouth at bedtime. 11/29/21  Yes [provider]  thiamine 100 MG tablet Take 1 tablet (100 mg total) by mouth daily. 11/04/21  Yes Armando Reichert, MD  chlordiazePOXIDE (LIBRIUM) 25 MG capsule '50mg'$  PO TID x 1D, then 25-'50mg'$  PO BID X 1D, then 25-'50mg'$  PO QD X 1D Patient not taking: Reported on 05/03/2022 04/09/22   Wyvonnia Dusky, MD  ondansetron (ZOFRAN-ODT) 4 MG disintegrating tablet Take 1 tablet (4 mg total) by mouth every 8 (eight) hours as needed for up to 10 doses for nausea or vomiting. Patient not taking: Reported on 05/03/2022 04/09/22   Wyvonnia Dusky, MD      Allergies    Oxycodone hcl and Ativan [lorazepam]    Review of Systems   Review of Systems  Cardiovascular:  Positive for chest pain.    Physical Exam Updated Vital Signs BP (!) 165/96 (BP Location: Right Arm)   Pulse 73   Temp 98.1 F (36.7 C) (Oral)   Resp 10   Ht '5\' 8"'$  (1.727 m)   Wt 54.4 kg   SpO2 99%   BMI 18.25 kg/m  Physical Exam Vitals and nursing note reviewed.  HENT:     Head: Normocephalic and atraumatic.     Mouth/Throat:     Mouth: Mucous membranes are moist.  Eyes:     General:        Right eye: No discharge.  Left eye: No discharge.     Conjunctiva/sclera: Conjunctivae normal.  Cardiovascular:     Rate and Rhythm: Normal rate and regular rhythm.     Pulses: Normal pulses.     Heart sounds: Normal heart sounds.  Pulmonary:     Effort: Pulmonary effort is normal.     Breath sounds: Normal breath sounds.  Abdominal:     General: Abdomen is flat.     Palpations: Abdomen is soft.  Skin:    General: Skin is warm and dry.  Neurological:     General: No focal deficit present.     Mental Status: He is oriented to person, place, and time.     Comments: Tremulous on exam  Psychiatric:        Mood and Affect: Mood normal.     ED Results / Procedures / Treatments   Labs (all labs ordered are listed, but only abnormal results  are displayed) Labs Reviewed  COMPREHENSIVE METABOLIC PANEL - Abnormal; Notable for the following components:      Result Value   Sodium 146 (*)    BUN 5 (*)    All other components within normal limits  ETHANOL - Abnormal; Notable for the following components:   Alcohol, Ethyl (B) 327 (*)    All other components within normal limits  CBC WITH DIFFERENTIAL/PLATELET  LIPASE, BLOOD  AMMONIA  RAPID URINE DRUG SCREEN, HOSP PERFORMED  CBG MONITORING, ED  TROPONIN I (HIGH SENSITIVITY)  TROPONIN I (HIGH SENSITIVITY)    EKG EKG Interpretation  Date/Time:  Tuesday May 02 2022 22:54:31 EDT Ventricular Rate:  81 PR Interval:  174 QRS Duration: 92 QT Interval:  366 QTC Calculation: 425 R Axis:   50 Text Interpretation: Normal sinus rhythm Confirmed by Randal Buba, April ([id number]) on 05/03/2022 7:59:25 AM  Radiology CT Angio Chest/Abd/Pel for Dissection W and/or W/WO  Result Date: 05/03/2022 CLINICAL DATA:  Chest and back pain. Concern for aortic dissection. Chest pain radiating to the jaw. Bilateral shoulder pain. LEFT lower quadrant like pain. Onset for 2 days. EXAM: CT ANGIOGRAPHY CHEST, ABDOMEN AND PELVIS TECHNIQUE: Non-contrast CT of the chest was initially obtained. Multidetector CT imaging through the chest, abdomen and pelvis was performed using the standard protocol during bolus administration of intravenous contrast. Multiplanar reconstructed images and MIPs were obtained and reviewed to evaluate the vascular anatomy. RADIATION DOSE REDUCTION: This exam was performed according to the departmental dose-optimization program which includes automated exposure control, adjustment of the mA and/or kV according to patient size and/or use of iterative reconstruction technique. CONTRAST:  110m OMNIPAQUE IOHEXOL 350 MG/ML SOLN COMPARISON:  None Available. FINDINGS: CTA CHEST FINDINGS Cardiovascular: Non IV contrast series demonstrates no intramural hematoma within the thoracic aorta. Contrast  enhanced imaging demonstrates no aortic dissection or trans aneurysm. Ascending aorta measures 33 mm within normal limits. Normal great vessel anatomy. No pericardial fluid. No pulmonary embolism identified. Mediastinum/Nodes: Trachea and esophagus are normal. No mediastinal hematoma or gas. Lungs/Pleura: No pneumothorax. No pulmonary infarction or pneumonia. Musculoskeletal: No aggressive or acute osseous findings Review of the MIP images confirms the above findings. CTA ABDOMEN AND PELVIS FINDINGS VASCULAR Aorta: Normal caliber aorta without aneurysm, dissection, vasculitis or significant stenosis. Celiac: Patent without evidence of aneurysm, dissection, vasculitis or significant stenosis. SMA: Patent without evidence of aneurysm, dissection, vasculitis or significant stenosis. Renals: Both renal arteries are patent without evidence of aneurysm, dissection, vasculitis, fibromuscular dysplasia or significant stenosis. IMA: Patent without evidence of aneurysm, dissection, vasculitis or significant stenosis. Inflow: Patent  without evidence of aneurysm, dissection, vasculitis or significant stenosis. Veins: No obvious venous abnormality within the limitations of this arterial phase study. Review of the MIP images confirms the above findings. NON-VASCULAR Hepatobiliary: No focal hepatic lesion. No biliary duct dilatation. Common bile duct is normal. Pancreas: Pancreas is normal. No ductal dilatation. No pancreatic inflammation. Spleen: Normal spleen Adrenals/urinary tract: Adrenal glands and kidneys are normal. The ureters and bladder normal. Stomach/Bowel: Stomach, small-bowel and cecum normal. Post appendectomy. The colon and rectosigmoid colon are normal. Vascular/Lymphatic: Abdominal aorta is normal caliber. No periportal or retroperitoneal adenopathy. No pelvic adenopathy. Reproductive: Unremarkable Other: No free fluid. Musculoskeletal: No aggressive osseous lesion. Review of the MIP images confirms the above  findings. IMPRESSION: Chest Impression: 1. No aortic dissection or aneurysm. 2. No evidence acute pulmonary embolism. 3. No acute pulmonary parenchymal findings. Abdomen / Pelvis Impression: 1. No evidence abdominal aortic dissection or aneurysm. 2. No acute findings in abdomen pelvis. Electronically Signed   By: Suzy Bouchard M.D.   On: 05/03/2022 11:22   CT Head Wo Contrast  Result Date: 05/03/2022 CLINICAL DATA:  Altered mental status EXAM: CT HEAD WITHOUT CONTRAST TECHNIQUE: Contiguous axial images were obtained from the base of the skull through the vertex without intravenous contrast. RADIATION DOSE REDUCTION: This exam was performed according to the departmental dose-optimization program which includes automated exposure control, adjustment of the mA and/or kV according to patient size and/or use of iterative reconstruction technique. COMPARISON:  02/19/2022 FINDINGS: Brain: No acute intracranial abnormality. Specifically, no hemorrhage, hydrocephalus, mass lesion, acute infarction, or significant intracranial injury. Vascular: No hyperdense vessel or unexpected calcification. Skull: No acute calvarial abnormality. Sinuses/Orbits: No acute findings Other: None IMPRESSION: No acute intracranial abnormality. Electronically Signed   By: Rolm Baptise M.D.   On: 05/03/2022 00:18   DG Chest 2 View  Result Date: 05/02/2022 CLINICAL DATA:  Chest pain EXAM: CHEST - 2 VIEW COMPARISON:  08/25/2021 FINDINGS: The heart size and mediastinal contours are within normal limits. Both lungs are clear. The visualized skeletal structures are unremarkable. IMPRESSION: Normal study Electronically Signed   By: Rolm Baptise M.D.   On: 05/02/2022 23:34    Procedures Procedures    Medications Ordered in ED Medications  ondansetron [facility]) injection 4 mg (4 mg Intravenous Given 05/03/22 0926)  LORazepam (ATIVAN) injection 1 mg (1 mg Intravenous Given 05/03/22 0925)  alum & mag hydroxide-simeth (MAALOX/MYLANTA)  200-200-20 MG/5ML suspension 30 mL (30 mLs Oral Given 05/03/22 0934)  famotidine (PEPCID) tablet 20 mg (20 mg Oral Given 05/03/22 0934)  iohexol (OMNIPAQUE) 350 MG/ML injection 100 mL (100 mLs Intravenous Contrast Given 05/03/22 1105)    ED Course/ Medical Decision Making/ A&P                           Medical Decision Making Risk OTC drugs. Prescription drug management.   This patient presents to the ED for concern of chest pain, this involves a number of treatment options, and is a complaint that carries with it a high risk of complications and morbidity.  The differential diagnosis includes ACS, PE, aortic dissection, pancreatitis, and alcoholic intoxication.   Co morbidities: Discussed in HPI   EMR reviewed including pt PMHx, past surgical history and past visits to ER. Was seen in July of this year for seizure r/t alcohol withdrawal. CT abdomen/pelvis in June 2023 revealed fatty liver and sigmoid diverticulosis.   See HPI for more details   Lab Tests:  I ordered and independently interpreted labs. Labs notable for Elevated alcohol level, normal troponin   Imaging Studies:  NAD. I personally reviewed all imaging studies and no acute abnormality found. I agree with radiology interpretation.    Cardiac Monitoring:  The patient was maintained on a cardiac monitor.  I personally viewed and interpreted the cardiac monitored which showed an underlying rhythm of: NSR EKG non-ischemic   Medicines ordered:  I ordered medication including ativan for likely alcohol withdrawal. Zofran for nausea.  Reevaluation of the patient after these medicines showed that the patient improved I have reviewed the patients home medicines and have made adjustments as needed     Consults/Attending Physician   I discussed this case with my attending physician who cosigned this note including patient's presenting symptoms, physical exam, and planned diagnostics and interventions. Attending  physician stated agreement with plan or made changes to plan which were implemented.   Reevaluation:  After the interventions noted above I re-evaluated patient and found that they have :improved    Problem List / ED Course:  Patient presented for chest pain. EKG and troponin level reassuring. Tremulous on exam likely related to EtOH withdrawal. Gave ativan for likely alcohol withdrawal. Labs revealed elevated blood alcohol level. Had syncopal episode in lobby prompting head CT. Could not rule out aortic dissection given history and ongoing chest pain radiating to the back, ordered CT angio chest/abd/pelvis. PO challenged. Patient refused to walk both otherwise stated that he was feeling much better. Discussed return precautions. Recommended tylenol and ibuprofen for pain. Syncope and tremulous likely related to alcohol intoxication.    Dispostion:  After consideration of the diagnostic results and the patients response to treatment, I feel that the patent would benefit from discharge and f/u with PCP.         Final Clinical Impression(s) / ED Diagnoses Final diagnoses:  Chest pain, unspecified type  Abdominal pain, unspecified abdominal location    Rx / DC Orders ED Discharge Orders     None         Harriet Pho, PA-C 05/03/22 1226    Davonna Belling, MD 05/04/22 [phone]

## 2022-05-03 NOTE — ED Notes (Signed)
Pt reports CP radiating to Jaw, bilateral shoulders, to his L arm as well as LLQ pain and L leg pain onset 2 days ago which is intermittent. Pt describes pain as a clutching pain. Pt reports he drinks a pint of liquor a day and his last drink was last night at 2000. Pt has had withdrawals with seizures in the past. Pt currently does not appear to be in distress.

## 2022-05-03 NOTE — Discharge Instructions (Addendum)
Diagnosed with non specific chest pain and abdominal pain. CT scan was negative. EKG and labs were normal. Recommend tylenol/advil for pain and continue good hydration. Otherwise, if you have worsening chest pain, shortness of breath, new abdominal pain that radiates to the back or into the chest, stabbing back pain, please return the ED for further evaluation.

## 2022-05-03 NOTE — ED Notes (Addendum)
Notified provider that I had already given pt crackers and a drink for his PO challenge. Notified pt that he can't eat or drink anything at this time d/t a new order for a CT angio placed. Pt verbalized understanding and food and drink removed from pt bedside

## 2022-05-03 NOTE — ED Notes (Signed)
Went to check on pt and ambulate. Pt states he is still having chest pain and pressure. Pt declines to ambulate at this time d/t feeling unsteady. Provider notified

## 2022-06-16 DIAGNOSIS — Y907 Blood alcohol level of 200-239 mg/100 ml: Secondary | ICD-10-CM | POA: Diagnosis not present

## 2022-06-16 DIAGNOSIS — R569 Unspecified convulsions: Secondary | ICD-10-CM | POA: Insufficient documentation

## 2022-06-16 DIAGNOSIS — F1010 Alcohol abuse, uncomplicated: Secondary | ICD-10-CM | POA: Insufficient documentation

## 2022-06-16 DIAGNOSIS — R109 Unspecified abdominal pain: Secondary | ICD-10-CM | POA: Diagnosis not present

## 2022-06-16 DIAGNOSIS — R519 Headache, unspecified: Secondary | ICD-10-CM | POA: Insufficient documentation

## 2022-06-16 DIAGNOSIS — I10 Essential (primary) hypertension: Secondary | ICD-10-CM | POA: Insufficient documentation

## 2022-06-16 DIAGNOSIS — Z79899 Other long term (current) drug therapy: Secondary | ICD-10-CM | POA: Insufficient documentation

## 2022-06-17 ENCOUNTER — Encounter (HOSPITAL_BASED_OUTPATIENT_CLINIC_OR_DEPARTMENT_OTHER): Payer: Self-pay | Admitting: Urology

## 2022-06-17 ENCOUNTER — Emergency Department (HOSPITAL_BASED_OUTPATIENT_CLINIC_OR_DEPARTMENT_OTHER)
Admission: EM | Admit: 2022-06-17 | Discharge: 2022-06-17 | Disposition: A | Discharge status: Home / Self Care | Payer: PRIVATE HEALTH INSURANCE | Attending: Emergency Medicine | Admitting: Emergency Medicine

## 2022-06-17 ENCOUNTER — Emergency Department (HOSPITAL_BASED_OUTPATIENT_CLINIC_OR_DEPARTMENT_OTHER): Payer: PRIVATE HEALTH INSURANCE

## 2022-06-17 DIAGNOSIS — R569 Unspecified convulsions: Secondary | ICD-10-CM

## 2022-06-17 DIAGNOSIS — F1010 Alcohol abuse, uncomplicated: Secondary | ICD-10-CM

## 2022-06-17 LAB — COMPREHENSIVE METABOLIC PANEL
ALT: 32 U/L (ref 0–44)
AST: 28 U/L (ref 15–41)
Albumin: 4.1 g/dL (ref 3.5–5.0)
Alkaline Phosphatase: 83 U/L (ref 38–126)
Anion gap: 9 (ref 5–15)
BUN: 7 mg/dL (ref 6–20)
CO2: 27 mmol/L (ref 22–32)
Calcium: 8.6 mg/dL — ABNORMAL LOW (ref 8.9–10.3)
Chloride: 105 mmol/L (ref 98–111)
Creatinine, Ser: 0.74 mg/dL (ref 0.61–1.24)
GFR, Estimated: >60 mL/min (ref >60)
Glucose, Bld: 96 mg/dL (ref 70–99)
Potassium: 3.3 mmol/L — ABNORMAL LOW (ref 3.5–5.1)
Sodium: 141 mmol/L (ref 135–145)
Total Bilirubin: 0.5 mg/dL (ref 0.3–1.2)
Total Protein: 7.2 g/dL (ref 6.5–8.1)

## 2022-06-17 LAB — PROTIME-INR
INR: 1 (ref 0.8–1.2)
Prothrombin Time: 12.8 seconds (ref 11.4–15.2)

## 2022-06-17 LAB — CBC
HCT: 47.3 % (ref 39.0–52.0)
Hemoglobin: 15.9 g/dL (ref 13.0–17.0)
MCH: 31.9 pg (ref 26.0–34.0)
MCHC: 33.6 g/dL (ref 30.0–36.0)
MCV: 94.8 fL (ref 80.0–100.0)
Platelets: 262 10*3/uL (ref 150–400)
RBC: 4.99 MIL/uL (ref 4.22–5.81)
RDW: 12.8 % (ref 11.5–15.5)
WBC: 6.9 10*3/uL (ref 4.0–10.5)
nRBC: 0 % (ref 0.0–0.2)

## 2022-06-17 LAB — DIFFERENTIAL
Abs Immature Granulocytes: 0.01 10*3/uL (ref 0.00–0.07)
Basophils Absolute: 0.1 10*3/uL (ref 0.0–0.1)
Basophils Relative: 1 %
Eosinophils Absolute: 0.2 10*3/uL (ref 0.0–0.5)
Eosinophils Relative: 4 %
Immature Granulocytes: 0 %
Lymphocytes Relative: 33 %
Lymphs Abs: 2.3 10*3/uL (ref 0.7–4.0)
Monocytes Absolute: 0.5 10*3/uL (ref 0.1–1.0)
Monocytes Relative: 7 %
Neutro Abs: 3.9 10*3/uL (ref 1.7–7.7)
Neutrophils Relative %: 55 %

## 2022-06-17 LAB — CBG MONITORING, ED: Glucose-Capillary: 90 mg/dL (ref 70–99)

## 2022-06-17 LAB — APTT: aPTT: 28 seconds (ref 24–36)

## 2022-06-17 LAB — ETHANOL: Alcohol, Ethyl (B): 209 mg/dL — ABNORMAL HIGH (ref <10)

## 2022-06-17 NOTE — ED Notes (Signed)
Pt has returned from [facility] via stretcher- cardiac and pulse ox monitoring resumed.   GCS 15.

## 2022-06-17 NOTE — ED Notes (Signed)
Optometrist noted pt leaving on his own, she observed him walking without difficulty. Provider notified.

## 2022-06-17 NOTE — ED Triage Notes (Signed)
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

## 2022-06-17 NOTE — ED Notes (Signed)
Late entry -- pt reports ?seizure activity while in bed; reports last known seizure was approx 2-3 weeks pta.  Educated pt regarding driving unless cleared by neurology; pt then reported he was still confused.  Pt continues to acknowledge alcohol consumption states 3-4 hrs pta; reports half of 1/2pint of whiskey - denies daily ETOH use.  RR even and unlabored on RA with symmetrical rise and fall of chest.  O2 sats 100%.  Cardiac and continuous pulse ox maintained.  Erythematous face; skin otherwise dry and intact.  Seizure prec in place (side rails padded and suction set up); no seizure activity noted since arrival.

## 2022-06-17 NOTE — ED Notes (Signed)
Dr Christy Gentles has again spoken with patient who first told provider that he would be able to locate a ride home however no telling provider, as he told this nurse, he does not have anyone to call and that he did drive himself.  Per Dr. Christy Gentles pt to remain in room to rest up; d/c when sober

## 2022-06-17 NOTE — ED Notes (Signed)
Reports being out of amlodipine and hydralazine x 2.5 weeks

## 2022-06-17 NOTE — ED Notes (Signed)
Patient transported to CT 

## 2022-06-17 NOTE — ED Notes (Signed)
Pt remains in bed resting -- educated pt that charge nurse can arrange for taxi and cover costs for taxi -- pt states he will not take a taxi; states he will not leave his car and no one can stop him -- reinforced to pt that even after sobering up from ETOH use pta that he is not legally supposed to be driving due to high risk for seizure activity -- pt again states he will drive his car when he leaves and he does not have far to go; continues to state no one can stop him - Dr. Christy Gentles and charge nurse Lattie Haw made aware of these statements-- Dr. Christy Gentles back to bedside to speak directly with pt

## 2022-06-17 NOTE — ED Notes (Addendum)
Late entry --  Pt had received d/c instructions prior to departure; pt did depart while this nurse was in another room with other patient care -- night shift security had been notified of pt location/status and notified pt had driven an older make Lexus; navy blue in color with [place] tags- security asked to call local police if pt seen driving this vehicle at departure- night shift security guard advised she would pass on to a.m. security guard -- have spoken to dayshift security who advises he did not receive this info - pt has already departed from property

## 2022-06-17 NOTE — ED Notes (Signed)
Pt aroused from resting state with tactile stimulation; difficulty maintaining arousal - - reinforced plan for d/c as discussed by provider.  Pt asked if he had called someone to pick him up; pt states he doesn't have anyone to call and reports to this nurse that he did drive  -- educated pt that due to ETOH level and seizure risk he is not safe to drive - secure chat to charge nurse and ED provider Dr. Christy Gentles advising on pt report again that he drove himself and stating he has no ride

## 2022-06-17 NOTE — ED Notes (Signed)
Verbally reinforced d/c instructions and provided pt with written copy - educated pt on importance of neuro follow up and abstaining from etoh use.

## 2022-06-17 NOTE — ED Notes (Signed)
ED Provider at bedside. 

## 2022-06-17 NOTE — ED Provider Notes (Signed)
[facility] EMERGENCY DEPARTMENT Provider Note   CSN: [id number] Arrival date & time: 06/16/22  2358     History  Chief Complaint  Patient presents with   Seizures    Charles Graves is a 41 y.o. male.  The history is provided by the patient.  Patient with a history of hypertension and alcohol use disorder presents with possible seizure.  Patient reports that he found himself in the floor and thinks he had a seizure.  He reports no one actually witnessed the seizure.  He reports he had a similar episode about 2 weeks ago.  Patient reports daily alcohol use typically drinks about a half a pint of bourbon whiskey  Patient reports previous history of "brain bleed "and since that time has had chronic headaches and left-sided pain and weakness and numbness. This is chronic for him.  He also reports chronic abdominal pain. He reports he is also out of his BP medications He does not take anticoagulation. He reports he is employed and worked earlier in the day     Home Medications Prior to Admission medications   Medication Sig Start Date End Date Taking? Authorizing Provider  amLODipine (NORVASC) 10 MG tablet Take 1 tablet (10 mg total) by mouth daily. 04/03/21   Shawna Clamp, MD  Cholecalciferol 25 MCG (1000 UT) tablet Take 1,000 Units by mouth daily. 10/26/21   [provider]  famotidine (PEPCID) 20 MG tablet Take 1 tablet (20 mg total) by mouth 2 (two) times daily. 04/09/22 05/09/22  Wyvonnia Dusky, MD  folic acid (FOLVITE) 1 MG tablet Take 1 tablet (1 mg total) by mouth daily. 11/04/21   Armando Reichert, MD  gabapentin (NEURONTIN) 300 MG capsule Take 300 mg by mouth 3 (three) times daily. 10/26/21   [provider]  methocarbamol (ROBAXIN) 500 MG tablet Take 500 mg by mouth 2 (two) times daily. 10/26/21   [provider]  mirtazapine (REMERON) 15 MG tablet Take 15 mg by mouth at bedtime. 10/26/21   [provider]  Multiple Vitamin  (MULTIVITAMIN WITH MINERALS) TABS tablet Take 1 tablet by mouth daily.    [provider]  pantoprazole (PROTONIX) 40 MG tablet Take 1 tablet (40 mg total) by mouth daily. 04/03/21   Shawna Clamp, MD  QUEtiapine (SEROQUEL) 50 MG tablet Take 50 mg by mouth at bedtime. 11/29/21   [provider]  thiamine 100 MG tablet Take 1 tablet (100 mg total) by mouth daily. 11/04/21   Armando Reichert, MD      Allergies    Oxycodone hcl and Ativan [lorazepam]    Review of Systems   Review of Systems  Cardiovascular:  Negative for chest pain.  Gastrointestinal:        Chronic abdominal pain  Neurological:  Positive for seizures and headaches.    Physical Exam Updated Vital Signs BP 112/86   Pulse 77   Temp 97.9 F (36.6 C) (Oral)   Resp 12   Ht 1.727 m ('5\' 8"'$ )   Wt 54.4 kg   SpO2 98%   BMI 18.24 kg/m  Physical Exam CONSTITUTIONAL: Disheveled appears older than stated age HEAD: Normocephalic/atraumatic, no signs of trauma EYES: EOMI/PERRL ENMT: Mucous membranes moist NECK: supple no meningeal signs SPINE/BACK:entire spine nontender No bruising/crepitance/stepoffs noted to spine CV: S1/S2 noted, no murmurs/rubs/gallops noted LUNGS: Lungs are clear to auscultation bilaterally, no apparent distress ABDOMEN: soft, mild diffuse tenderness NEURO: Pt is awake/alert/appropriate, moves all extremitiesx4.  No facial droop.   EXTREMITIES: pulses  normal/equal, full ROM, no deformities, full range of motion of all 4 extremities SKIN: warm, color normal PSYCH: no abnormalities of mood noted, alert and oriented to situation  ED Results / Procedures / Treatments   Labs (all labs ordered are listed, but only abnormal results are displayed) Labs Reviewed  ETHANOL - Abnormal; Notable for the following components:      Result Value   Alcohol, Ethyl (B) 209 (*)    All other components within normal limits  COMPREHENSIVE METABOLIC PANEL - Abnormal; Notable for the following components:    Potassium 3.3 (*)    Calcium 8.6 (*)    All other components within normal limits  PROTIME-INR  APTT  CBC  DIFFERENTIAL  CBG MONITORING, ED    EKG EKG Interpretation  Date/Time:  Saturday June 17 2022 00:22:42 EDT Ventricular Rate:  83 PR Interval:  184 QRS Duration: 104 QT Interval:  381 QTC Calculation: 448 R Axis:   42 Text Interpretation: Sinus rhythm Probable left atrial enlargement Confirmed by Ripley Fraise [phone]) on 06/17/2022 12:36:03 AM  Radiology CT HEAD WO CONTRAST  Result Date: 06/17/2022 CLINICAL DATA:  Seizure, confusion, dizziness EXAM: CT HEAD WITHOUT CONTRAST TECHNIQUE: Contiguous axial images were obtained from the base of the skull through the vertex without intravenous contrast. RADIATION DOSE REDUCTION: This exam was performed according to the departmental dose-optimization program which includes automated exposure control, adjustment of the mA and/or kV according to patient size and/or use of iterative reconstruction technique. COMPARISON:  05/03/2022 FINDINGS: Brain: No evidence of acute infarction, hemorrhage, hydrocephalus, extra-axial collection or mass lesion/mass effect. Subcortical white matter and periventricular small vessel ischemic changes. Vascular: No hyperdense vessel or unexpected calcification. Skull: Normal. Negative for fracture or focal lesion. Sinuses/Orbits: The visualized paranasal sinuses are essentially clear. The mastoid air cells are unopacified. Other: None. IMPRESSION: No acute intracranial abnormality. Small vessel ischemic changes. Electronically Signed   By: Julian Hy M.D.   On: 06/17/2022 00:45    Procedures Procedures    Medications Ordered in ED Medications - No data to display  ED Course/ Medical Decision Making/ A&P Clinical Course as of 06/17/22 0215  Sat Jun 17, 2022  0126 Alcohol, Ethyl (B)(!): 209 Alcohol intoxication noted [DW]  0132 Patient with history of alcohol use disorder, hypertension  previous history of traumatic subarachnoid hemorrhage presents with questionable seizure activity.  Patient admits that no one actually witnessed the seizure.  Patient is intoxicated.  I personally visualized the CT head that is negative.  Labs overall reassuring except for elevated alcohol level.  Will monitor and refer to neurology as an outpatient.  Patient will be advised no driving [DW]  1884 Patient resting comfortably, no acute distress.  No seizure activity here.  Patient be discharged home.  Advised he can no longer drive until seen by neurology.  Urgent referral to neurology has been placed.  No acute neurologic emergency at this time.  Patient is safe for discharge [DW]    Clinical Course User Index [DW] Ripley Fraise, MD         Glasgow Coma Scale Score: 15                  Medical Decision Making Amount and/or Complexity of Data Reviewed Labs: ordered. Decision-making details documented in ED Course. Radiology: ordered.   This patient presents to the ED for concern of seizures, this involves an extensive number of treatment options, and is a complaint that carries with it a high risk of complications  and morbidity.  The differential diagnosis includes but is not limited to electrolyte abnormality, alcohol withdrawal seizure, status epilepticus, toxin induced seizure  Comorbidities that complicate the patient evaluation: Patient's presentation is complicated by their history of hypertension, alcohol use disorder  Social Determinants of Health: Patient's lack of prescription access and alcohol use   increases the complexity of managing their presentation  Additional history obtained: Records reviewed previous admission documents  Lab Tests: I Ordered, and personally interpreted labs.  The pertinent results include: Alcohol intoxication noted labs  Imaging Studies ordered: I ordered imaging studies including CT scan head   I independently visualized and interpreted imaging  which showed no acute findings I agree with the radiologist interpretation  Cardiac Monitoring: The patient was maintained on a cardiac monitor.  I personally viewed and interpreted the cardiac monitor which showed an underlying rhythm of:  sinus rhythm  Reevaluation: After the interventions noted above, I reevaluated the patient and found that they have :improved  Complexity of problems addressed: Patient's presentation is most consistent with  acute presentation with potential threat to life or bodily function  Disposition: After consideration of the diagnostic results and the patient's response to treatment,  I feel that the patent would benefit from discharge   .           Final Clinical Impression(s) / ED Diagnoses Final diagnoses:  Seizure-like activity ([facility])  Alcohol abuse    Rx / DC Orders ED Discharge Orders          Ordered    Ambulatory referral to Neurology       Comments: An appointment is requested in approximately: 2 weeks   06/17/22 0128              Ripley Fraise, MD 06/17/22 [phone]

## 2022-06-17 NOTE — Discharge Instructions (Addendum)
Substance Abuse Treatment Programs ° °Intensive Outpatient Programs °[facility]     °[address]      °[place], [place]                   °[phone]      ° °[facility] °[address] °[place], [place] °[phone] ° °[facility]     °(Inpatient and outpatient)     °[address]           °[phone]   ° °[facility] °[phone] (Suboxone and Methadone) ° °[address]      °[place], [place] [address]      °[phone]      ° °[address] °[place], [place] °852-3033 ° °[facility] (Outpatient/Inpatient, [facility])    °(insurance only) [phone]      °       °[facility] (Groups & Residential) °[place], [place] °[phone] ° °   °[facility]     °[address]     °[place], [place]      °[phone]      ° °[facility] (for caregivers and family) °[address] °[place], [place] °336-299-4655 ° ° ° ° ° °Residential Treatment Programs °[facility]      °[address], [place], [place] [address]  °[phone]      ° °[facility] °[address] [place], [place] [address] °[phone] ° °[facility]        °[phone]      ° °[facility] °[phone] ° °[facility]             °[address]                                         °[place], [place]                                                °[phone] or [phone]                              ° °[facility] [place] °[address] °[place], [address] °[phone] ° °[facility]    °[address]      °[place], [place]     °[phone]      ° °[facility] °[address] °[place], [place] °[phone] ° °[facility]   °[address]     °[place], [place] [address]     °[phone]      °Admissions: 8am-3pm M-F ° °[facility] °[address] °[place],  [place] °[phone] ° °[facility]: Residential Program (90 Days)   °[place], [place]      °336-725-8389 or [phone]    ° °[facility] °[place], [place] °(Walk in Hours over the weekend or by referral) ° °[facility] °[address], [place], [place] [address] °([phone] ° °Crisis Mobile: [facility]:  1-877-626-1772 (for crisis response 24 hours a day) °[facility] Hotline:      1-800-256-2452 °Outpatient Psychiatry and Counseling ° °[facility]: Mobile Crisis   Management 24 hours:  1-877-626-1772 ° °[facility] sliding scale fee and walk in schedule: M-F 8am-12pm/1pm-3pm °[address]  °[place], [place] [address] °[phone] ° °[facility] °[address] °[place], [place] [address] °[phone] ° °[facility] ([facility])- new patient walk-in appointments available Monday - Friday 8am -3pm.          °[address] °[place], [place] [address] °336-676-6840 or crisis line- 336-676-6905 ° °[facility] Intensive Outpatient Therapy Program °[address] °[place], [place] [address] °336-832-9804 ° °[facility]                  °Crisis Services      °[phone]      °[address]     °[place], [place] [address]                ° °[facility]   °[facility] °[phone] °[address] °[place], [place] [address] ° ° °[facility]          °[address],  °[place], [place] [address]       °([phone] ° °[facility] °[address] °[place], [place] [address] °336-292-1510 ° °[facility]    °[address]    °[place], [place] [address]     °[phone]      ° °Parish McKinney, MD     °[address]     °[place] [address]     °[phone]     °  °[facility] °[address] °[place] [address] ° °[facility]     °[address]     °[place], [place]      °336-542-2076      ° °[facility] °Shamsher Ahluwalia, MD °[address] °[place], [place] [address] °[phone] ° °[facility]     °[address]     °[place], [place] [address]     °336-274-1237      ° °[facility] °[address] °[place], [place] [address] °336-854-4450 °Resources for Temporary Residential Assistance/Crisis Centers ° °DAY CENTERS °[facility] °M-F 8am-3pm   °[address] [place], [place] [address]   336-332-0824 °Services include: laundry, barbering, support groups, case management, phone  & computer access, showers, AA/NA mtgs, mental health/substance abuse nurse, job skills class, disability information, VA assistance, spiritual classes, etc.  ° °HOMELESS SHELTERS ° °[facility]     °[facility]   °[address], [place]     °[phone]       °       °[facility] (women and children)       °[address] °[place], [place] [address] °336-275-0820 °[email or web address] for application and process °Application Required ° °[facility]   °[address]    °[place] [address]     °[phone]       °             °[facility] °[address] °, [place] [address] °336.273.5572 °336-235-0363(schedule application appt.) °Application Required ° °[facility] (women only)    °[address]     °[place], [place] [address]     °336-884-1039      °  Intake starts 6pm daily Need valid ID, SSC, & Police report [facility] [address] [place], [place] [phone] Application Required  [employer] (men only)     [place].      [place], [place]       [facility] (Pregnant women only) [address] [place], [place]  [facility]      [facility]      [place], [place] [address]     [phone]             [facility] [address] [place], [place] 90 day commitment/SA/Application process  [facility](men only)     [address]     [place], [place]       Check-in at [facility] [address] [place], [place] [address] [phone] Men/Women/Women and Children must be there by 7 pm  [place], [place]                Please be aware you may have another seizure  Do not drive until seen by your physician for your condition  Do not climb ladders/roofs/trees as a seizure can occur at that height and cause serious harm  Do not bathe/swim alone as a seizure can occur and cause serious harm  Please followup with your physician or neurologist for further testing and possible treatment

## 2022-06-17 NOTE — ED Notes (Signed)
Pt reports driving a blue lexus with [place] pates -- confirmed this veh is in ED parking lot

## 2022-06-17 NOTE — ED Notes (Signed)
This Probation officer observed patient walk out without difficulty with a steady gait.

## 2022-06-27 ENCOUNTER — Encounter (HOSPITAL_COMMUNITY): Payer: Self-pay

## 2022-06-27 ENCOUNTER — Other Ambulatory Visit: Payer: Self-pay | Admitting: Psychiatry

## 2022-06-27 ENCOUNTER — Emergency Department (HOSPITAL_COMMUNITY)
Admission: EM | Admit: 2022-06-27 | Discharge: 2022-06-28 | Disposition: A | Discharge status: Other Acute Inpatient Hospital | Payer: PRIVATE HEALTH INSURANCE | Attending: Emergency Medicine | Admitting: Emergency Medicine

## 2022-06-27 DIAGNOSIS — F1010 Alcohol abuse, uncomplicated: Secondary | ICD-10-CM | POA: Diagnosis present

## 2022-06-27 DIAGNOSIS — Z046 Encounter for general psychiatric examination, requested by authority: Secondary | ICD-10-CM | POA: Diagnosis present

## 2022-06-27 DIAGNOSIS — Y908 Blood alcohol level of 240 mg/100 ml or more: Secondary | ICD-10-CM | POA: Diagnosis not present

## 2022-06-27 DIAGNOSIS — F332 Major depressive disorder, recurrent severe without psychotic features: Secondary | ICD-10-CM | POA: Diagnosis not present

## 2022-06-27 DIAGNOSIS — R45851 Suicidal ideations: Secondary | ICD-10-CM

## 2022-06-27 DIAGNOSIS — F1020 Alcohol dependence, uncomplicated: Secondary | ICD-10-CM | POA: Diagnosis not present

## 2022-06-27 DIAGNOSIS — F322 Major depressive disorder, single episode, severe without psychotic features: Secondary | ICD-10-CM

## 2022-06-27 DIAGNOSIS — Z20822 Contact with and (suspected) exposure to covid-19: Secondary | ICD-10-CM | POA: Diagnosis not present

## 2022-06-27 LAB — COMPREHENSIVE METABOLIC PANEL
ALT: 17 U/L (ref 0–44)
AST: 20 U/L (ref 15–41)
Albumin: 4.4 g/dL (ref 3.5–5.0)
Alkaline Phosphatase: 82 U/L (ref 38–126)
Anion gap: 10 (ref 5–15)
BUN: 6 mg/dL (ref 6–20)
CO2: 26 mmol/L (ref 22–32)
Calcium: 8.7 mg/dL — ABNORMAL LOW (ref 8.9–10.3)
Chloride: 107 mmol/L (ref 98–111)
Creatinine, Ser: 0.76 mg/dL (ref 0.61–1.24)
GFR, Estimated: >60 mL/min (ref >60)
Glucose, Bld: 96 mg/dL (ref 70–99)
Potassium: 3.4 mmol/L — ABNORMAL LOW (ref 3.5–5.1)
Sodium: 143 mmol/L (ref 135–145)
Total Bilirubin: 0.5 mg/dL (ref 0.3–1.2)
Total Protein: 7.5 g/dL (ref 6.5–8.1)

## 2022-06-27 LAB — RAPID URINE DRUG SCREEN, HOSP PERFORMED
Amphetamines: NOT DETECTED
Barbiturates: NOT DETECTED
Benzodiazepines: NOT DETECTED
Cocaine: NOT DETECTED
Opiates: NOT DETECTED
Tetrahydrocannabinol: POSITIVE — AB

## 2022-06-27 LAB — CBC
HCT: 48 % (ref 39.0–52.0)
Hemoglobin: 15.9 g/dL (ref 13.0–17.0)
MCH: 32.2 pg (ref 26.0–34.0)
MCHC: 33.1 g/dL (ref 30.0–36.0)
MCV: 97.2 fL (ref 80.0–100.0)
Platelets: 231 10*3/uL (ref 150–400)
RBC: 4.94 MIL/uL (ref 4.22–5.81)
RDW: 13.3 % (ref 11.5–15.5)
WBC: 6.8 10*3/uL (ref 4.0–10.5)
nRBC: 0 % (ref 0.0–0.2)

## 2022-06-27 LAB — SALICYLATE LEVEL: Salicylate Lvl: <7 mg/dL — ABNORMAL LOW (ref 7.0–30.0)

## 2022-06-27 LAB — RESP PANEL BY RT-PCR (FLU A&B, COVID) ARPGX2
Influenza A by PCR: NEGATIVE
Influenza B by PCR: NEGATIVE
SARS Coronavirus 2 by RT PCR: NEGATIVE

## 2022-06-27 LAB — ACETAMINOPHEN LEVEL: Acetaminophen (Tylenol), Serum: <10 ug/mL — ABNORMAL LOW (ref 10–30)

## 2022-06-27 LAB — ETHANOL: Alcohol, Ethyl (B): 344 mg/dL (ref <10)

## 2022-06-27 MED ORDER — LORAZEPAM 1 MG PO TABS: 0.0000 mg | ORAL_TABLET | Freq: Two times a day (BID) | ORAL | Status: DC

## 2022-06-27 MED ORDER — LORAZEPAM 2 MG/ML IJ SOLN: 0.0000 mg | Freq: Four times a day (QID) | INTRAMUSCULAR | Status: DC

## 2022-06-27 MED ORDER — AMLODIPINE BESYLATE 5 MG PO TABS
10.0000 mg | ORAL_TABLET | Freq: Every day | ORAL | Status: DC
  Administered 2022-06-27: 10 mg via ORAL
  Filled 2022-06-27: qty 2

## 2022-06-27 MED ORDER — THIAMINE MONONITRATE 100 MG PO TABS
100.0000 mg | ORAL_TABLET | Freq: Every day | ORAL | Status: DC
  Administered 2022-06-27: 100 mg via ORAL
  Filled 2022-06-27: qty 1

## 2022-06-27 MED ORDER — THIAMINE HCL 100 MG/ML IJ SOLN: 100.0000 mg | Freq: Every day | INTRAMUSCULAR | Status: DC

## 2022-06-27 MED ORDER — LORAZEPAM 2 MG/ML IJ SOLN: 0.0000 mg | Freq: Two times a day (BID) | INTRAMUSCULAR | Status: DC

## 2022-06-27 MED ORDER — LORAZEPAM 1 MG PO TABS
0.0000 mg | ORAL_TABLET | Freq: Four times a day (QID) | ORAL | Status: DC
  Administered 2022-06-27 (×2): 1 mg via ORAL
  Filled 2022-06-27 (×2): qty 1

## 2022-06-27 NOTE — ED Notes (Signed)
Pt ambulated to BR w/o difficulty, steady gait noted

## 2022-06-27 NOTE — ED Notes (Signed)
Pt in bed with eyes closed, resps even and unlabored, skin appears dry, report received, pt pending tts consult

## 2022-06-27 NOTE — ED Notes (Signed)
Pt appears to be sleeping, observed even RR and unlabored, blanket on pt for warmth and comfort, NAD noted, sitter within view of pt for safety, hall bed secured, plan of care on going, still awaiting response form [facility] for when pt may be transported to their facility, no further concerns as of present

## 2022-06-27 NOTE — Consult Note (Cosign Needed)
[place] ED ASSESSMENT   Reason for Consult:  psych consult Referring Physician:  Emeline Darling, PA-C Patient Identification: Charles Graves MRN:  [id number] ED Chief Complaint: Suicidal thoughts  Diagnosis:  Principal Problem:   Suicidal thoughts Active Problems:   Alcohol abuse   Major depressive disorder, recurrent episode, severe [facility])   ED Assessment Time Calculation: No data recorded  Subjective:   Charles Graves is a 41 y.o. male patient admitted with suicidal ideations.  Patient presents laying on stretcher; skin flushed. Withdrawn. Depressed mood. Blunt, flat affect. Inconsistent eye contact. He endorses ongoing alcohol use; says last night he and his family (mother, stepfather) decided "we were done with each other" prior to incident. Has been living with friends over the past few weeks. Works at [employer]. Denies any active nausea, headache, or dizziness. No visible sweat noted. Denies any active SI/HI/AVH; states he "doesn't really want to talk". Provider explained current recommendation for inpatient treatment, patient initially hesitant then verbalized an understanding.   Reassessment: 1630 Patient observed more alert, sitting on side of stretcher in the hallway speaking to a visitor he noted was his "best friend and roommate". Affect appears brighter than earlier. Provider discussed plan for transfer to [facility] later in the day; patient verbalized an understanding of plan and is amenable to treatment. Denies any headache, dizziness, or nausea. Reports some feelings of anxiety in which he reported to nursing. He acknowledges his "need for help" and expressed acceptance of the plan.   HPI:  Charles Graves is a 41 year old male patient with past psychiatric history of suicidal ideations, alcohol abuse, anxiety who presented to [facility] via [facility] under IVC for suicidal ideations after calling 911 and requesting officers "shoot" him in the head. Upon arrival to ED patient reported  plan to run into traffic and endorsed auditory and visual hallucinations. Recent history of increased alcohol use; reports drinking x1 pint of liquor daily, last drink 'few hours ago'. UDS outstanding; BAL 344. Per chart review, patient presented to [facility] 06/17/22 after reported seizure, UDS 209 was monitored and left on his own.   Past Psychiatric History: alcohol abuse, anxiety, major depressive disorder, suicidal ideations; inpatient psychiatric admission x3  Risk to Self or Others: Is the patient at risk to self? Yes Has the patient been a risk to self in the past 6 months? Yes Has the patient been a risk to self within the distant past? Yes Is the patient a risk to others? No Has the patient been a risk to others in the past 6 months? No Has the patient been a risk to others within the distant past? 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 No pertinent surgical history. Family History: No family history on file. Family Psychiatric  History: not noted  Social History:  Social History   Substance and Sexual Activity  Alcohol Use Yes   Comment: state  1/2 pint/day     Social History   Substance and Sexual Activity  Drug Use Not Currently    Social History   Socioeconomic History   Marital status: Single    Spouse name: Not on file   Number of children: Not on file   Years of education: Not on file   Highest education level: Not on file  Occupational History   Not on file  Tobacco  Use   Smoking status: Every Day    Packs/day: 0.50    Types: Cigarettes   Smokeless tobacco: Never  Vaping Use   Vaping Use: Never used  Substance and Sexual Activity   Alcohol use: Yes    Comment: state 1/2 pint/day   Drug use: Not Currently   Sexual activity: Not Currently  Other Topics Concern   Not on file  Social History Narrative   Not on file   Social Determinants of Health   Financial Resource Strain: Not on file  Food Insecurity: Not on file  Transportation Needs: Not on file  Physical Activity: Not on file  Stress: Not on file  Social Connections: Not on file   Additional Social History:    Allergies:   Allergies  Allergen Reactions   Oxycodone Hcl Diarrhea, Nausea And Vomiting and Swelling   Ativan [Lorazepam] Nausea And Vomiting    Labs:  Results for orders placed or performed during the hospital encounter of 06/27/22 (from the past 48 hour(s))  Comprehensive metabolic panel     Status: Abnormal   Collection Time: 06/27/22  2:11 AM  Result Value Ref Range   Sodium 143 135 - 145 mmol/L   Potassium 3.4 (L) 3.5 - 5.1 mmol/L   Chloride 107 98 - 111 mmol/L   CO2 26 22 - 32 mmol/L   Glucose, Bld 96 70 - 99 mg/dL    Comment: Glucose reference range applies only to samples taken after fasting for at least 8 hours.   BUN 6 6 - 20 mg/dL   Creatinine, Ser 0.76 0.61 - 1.24 mg/dL   Calcium 8.7 (L) 8.9 - 10.3 mg/dL   Total Protein 7.5 6.5 - 8.1 g/dL   Albumin 4.4 3.5 - 5.0 g/dL   AST 20 15 - 41 U/L   ALT 17 0 - 44 U/L   Alkaline Phosphatase 82 38 - 126 U/L   Total Bilirubin 0.5 0.3 - 1.2 mg/dL   GFR, Estimated >60 >60 mL/min    Comment: (NOTE) Calculated using the CKD-EPI Creatinine Equation (2021)    Anion gap 10 5 - 15    Comment: Performed at [facility], [place] [address], [place], [place] [address]  Ethanol     Status: Abnormal   Collection Time: 06/27/22  2:11 AM  Result Value Ref Range   Alcohol, Ethyl (B) 344 (HH) <10 mg/dL     Comment: CRITICAL RESULT CALLED TO, READ BACK BY AND VERIFIED WITH GRAY,S RN @ [phone] ON 1017 BY MAHMOUD,S (NOTE) Lowest detectable limit for serum alcohol is 10 mg/dL.  For medical purposes only. Performed at [facility], [place] [address], [place], [place] [address]   Salicylate level     Status: Abnormal   Collection Time: 06/27/22  2:11 AM  Result Value Ref Range   Salicylate Lvl <0.9 (L) 7.0 - 30.0 mg/dL    Comment: Performed at [facility], [place] [address], [place], [place] [address]  Acetaminophen level     Status: Abnormal   Collection  Time: 06/27/22  2:11 AM  Result Value Ref Range   Acetaminophen (Tylenol), Serum <10 (L) 10 - 30 ug/mL    Comment: (NOTE) Therapeutic concentrations vary significantly. A range of 10-30 ug/mL  may be an effective concentration for many patients. However, some  are best treated at concentrations outside of this range. Acetaminophen concentrations >150 ug/mL at 4 hours after ingestion  and >50 ug/mL at 12 hours after ingestion are often associated with  toxic reactions.  Performed at [facility], [place] [address], [place], [place] [address]   cbc     Status: None   Collection Time: 06/27/22  2:11 AM  Result Value Ref Range   WBC 6.8 4.0 - 10.5 K/uL   RBC 4.94 4.22 - 5.81 MIL/uL   Hemoglobin 15.9 13.0 - 17.0 g/dL   HCT 48.0 39.0 - 52.0 %   MCV 97.2 80.0 - 100.0 fL   MCH 32.2 26.0 - 34.0 pg   MCHC 33.1 30.0 - 36.0 g/dL   RDW 13.3 11.5 - 15.5 %   Platelets 231 150 - 400 K/uL   nRBC 0.0 0.0 - 0.2 %    Comment: Performed at [facility], [place] [address], [place], [place] [address]  Resp Panel by RT-PCR (Flu A&B, Covid) Anterior Nasal Swab     Status: None   Collection Time: 06/27/22  6:32 AM   Specimen: Anterior Nasal Swab  Result Value Ref Range   SARS Coronavirus 2 by RT PCR NEGATIVE NEGATIVE    Comment: (NOTE) SARS-CoV-2 target nucleic acids are NOT  DETECTED.  The SARS-CoV-2 RNA is generally detectable in upper respiratory specimens during the acute phase of infection. The lowest concentration of SARS-CoV-2 viral copies this assay can detect is 138 copies/mL. A negative result does not preclude SARS-Cov-2 infection and should not be used as the sole basis for treatment or other patient management decisions. A negative result may occur with  improper specimen collection/handling, submission of specimen other than nasopharyngeal swab, presence of viral mutation(s) within the areas targeted by this assay, and inadequate number of viral copies(<138 copies/mL). A negative result must be combined with clinical observations, patient history, and epidemiological information. The expected result is Negative.  Fact Sheet for Patients:  EntrepreneurPulse.com.au  Fact Sheet for Healthcare Providers:  IncredibleEmployment.be  This test is no t yet approved or cleared by the [place] FDA and  has been authorized for detection and/or diagnosis of SARS-CoV-2 by FDA under an Emergency Use Authorization (EUA). This EUA will remain  in effect (meaning this test can be used) for the duration of the COVID-19 declaration under Section 564(b)(1) of the Act, 21 U.S.C.section 360bbb-3(b)(1), unless the authorization is terminated  or revoked sooner.       Influenza A by PCR NEGATIVE NEGATIVE   Influenza B by PCR NEGATIVE NEGATIVE    Comment: (NOTE) The Xpert Xpress SARS-CoV-2/FLU/RSV plus assay is intended as an aid in the diagnosis of influenza from Nasopharyngeal swab specimens and should not be used as a sole basis for treatment. Nasal washings and aspirates are unacceptable for Xpert Xpress SARS-CoV-2/FLU/RSV testing.  Fact Sheet for Patients: EntrepreneurPulse.com.au  Fact Sheet for Healthcare Providers: IncredibleEmployment.be  This test is not yet approved or  cleared by the [place] FDA and has been authorized for detection and/or diagnosis of SARS-CoV-2 by FDA under an Emergency Use Authorization (EUA). This EUA will remain in effect (meaning this test can be used) for the duration of the COVID-19  declaration under Section 564(b)(1) of the Act, 21 U.S.C. section 360bbb-3(b)(1), unless the authorization is terminated or revoked.  Performed at [facility], [place] [address] [place], [place] [address]     Current Facility-Administered Medications  Medication Dose Route Frequency Provider Last Rate Last Admin   amLODipine (NORVASC) tablet 10 mg  10 mg Oral Daily Tretha Sciara, MD   10 mg at 06/27/22 1948   LORazepam (ATIVAN) injection 0-4 mg  0-4 mg Intravenous Q6H Sponseller, Rebekah R, PA-C       Or   LORazepam (ATIVAN) tablet 0-4 mg  0-4 mg Oral Q6H Sponseller, Rebekah R, PA-C   1 mg at 06/27/22 1938   [START ON 06/29/2022] LORazepam (ATIVAN) injection 0-4 mg  0-4 mg Intravenous Q12H Sponseller, Rebekah R, PA-C       Or   [START ON 06/29/2022] LORazepam (ATIVAN) tablet 0-4 mg  0-4 mg Oral Q12H Sponseller, Rebekah R, PA-C       thiamine (VITAMIN B1) tablet 100 mg  100 mg Oral Daily Sponseller, Rebekah R, PA-C   100 mg at 06/27/22 1338   Or   thiamine (VITAMIN B1) injection 100 mg  100 mg Intravenous Daily Sponseller, Rebekah R, PA-C       Current Outpatient Medications  Medication Sig Dispense Refill   amLODipine (NORVASC) 10 MG tablet Take 1 tablet (10 mg total) by mouth daily. 30 tablet 1   Cholecalciferol 25 MCG (1000 UT) tablet Take 1,000 Units by mouth daily.     famotidine (PEPCID) 20 MG tablet Take 1 tablet (20 mg total) by mouth 2 (two) times daily. 60 tablet 0   folic acid (FOLVITE) 1 MG tablet Take 1 tablet (1 mg total) by mouth daily.     gabapentin (NEURONTIN) 300 MG capsule Take 300 mg by mouth 3 (three) times daily.     methocarbamol (ROBAXIN) 500 MG tablet Take 500 mg by mouth 2 (two) times daily.      mirtazapine (REMERON) 15 MG tablet Take 15 mg by mouth at bedtime.     pantoprazole (PROTONIX) 40 MG tablet Take 1 tablet (40 mg total) by mouth daily. 90 tablet 0   QUEtiapine (SEROQUEL) 50 MG tablet Take 50 mg by mouth at bedtime.      Musculoskeletal: Strength & Muscle Tone: decreased Gait & Station: broad based Patient leans: N/A   Psychiatric Specialty Exam: Presentation  General Appearance:  Casual; Appropriate for Environment  Eye Contact: Fleeting  Speech: Clear and Coherent  Speech Volume: Normal  Handedness: Right   Mood and Affect  Mood: Dysphoric  Affect: Blunt; Congruent; Restricted   Thought Process  Thought Processes: Linear  Descriptions of Associations:Intact  Orientation:Partial  Thought Content:Logical  History of Schizophrenia/Schizoaffective disorder:No  Duration of Psychotic Symptoms:No data recorded Hallucinations:Hallucinations: None  Ideas of Reference:None  Suicidal Thoughts:Suicidal Thoughts: No  Homicidal Thoughts:Homicidal Thoughts: No  Sensorium  Memory: Immediate Fair; Recent Fair  Judgment: Fair  Insight: Shallow  Executive Functions  Concentration: Fair  Attention Span: Fair  Recall: [place] of Knowledge: Fair  Language: Fair  Psychomotor Activity  Psychomotor Activity:Psychomotor Activity: Normal  Assets  Assets: Resilience; Physical Health  Sleep  Sleep:Sleep: Fair  Physical Exam: Physical Exam Vitals and nursing note reviewed.  Constitutional:      General: He is not in acute distress.    Appearance: He is toxic-appearing.  HENT:     Head: Normocephalic.     Nose: Nose normal.     Mouth/Throat:  Mouth: Mucous membranes are moist.     Pharynx: Oropharynx is clear.  Eyes:     Pupils: Pupils are equal, round, and reactive to light.  Cardiovascular:     Rate and Rhythm: Normal rate.     Pulses: Normal pulses.  Pulmonary:     Effort: Pulmonary effort is normal.   Musculoskeletal:        General: Normal range of motion.     Cervical back: Normal range of motion.  Skin:    General: Skin is dry.     Comments: Flushed appearance  Neurological:     Mental Status: He is oriented to person, place, and time.  Psychiatric:        Attention and Perception: He does not perceive auditory or visual hallucinations.        Mood and Affect: Mood is depressed. Affect is flat.        Speech: He is noncommunicative.        Behavior: Behavior is slowed and withdrawn.        Thought Content: Thought content is not paranoid or delusional. Thought content does not include homicidal or suicidal ideation. Thought content does not include homicidal or suicidal plan.        Judgment: Judgment is impulsive.    Review of Systems  Psychiatric/Behavioral:  Positive for depression and substance abuse.   All other systems reviewed and are negative.  Blood pressure (!) 141/102, pulse 72, temperature 99.1 F (37.3 C), temperature source Oral, resp. rate 16, SpO2 97 %. There is no height or weight on file to calculate BMI.  Medical Decision Making: Based on patient presentation and recent history, current recommendation is to uphold current IVC and seek inpatient hospitalization for further stabilization and treatment. CIWA remains in place, home medications restarted. Patient currently under review at [facility].   Problem 1: Alcohol abuse severe dependence:   -Initiate CIWA protocol   -Seeking inpatient hospitalization for further detox, stabilization   Problem 2: Major Depressive Disorder, severe  -seeing inpatient hospitalization    Disposition: Recommend psychiatric Inpatient admission when medically cleared. Supportive therapy provided about ongoing stressors. Discussed crisis plan, support from social network, calling 911, coming to the Emergency Department, and calling Suicide Hotline. Patient accepted to [facility].   Inda Merlin, NP 06/27/2022 7:52 PM

## 2022-06-27 NOTE — ED Provider Notes (Signed)
[facility] [facility] DEPT Provider Note   CSN: [id number] Arrival date & time: 06/27/22  0138     History  Chief Complaint  Patient presents with   IVC   Suicidal    Charles Graves is a 41 y.o. male who presents under IVC from [place] after the patient called him to his house for suicidality and requested that the officers shoot him in the head.  Patient states that he continues to feel suicidal at this time and endorses AVH intermittently for the last few days.  Patient states that his current episode of suicidality was primarily prompted by episode of familial conflict.  Additionally has not been on his medications x3 days due to running out of his prescriptions including his antihypertensive medication.  Denies homicidality.  Does endorse significant alcohol use this evening but denies any other recreational drug use.  I personally reviewed his medical records.  In addition to malingering and alcohol abuse he has history of brain hemangioma and subarachnoid hemorrhage.  He did has any trauma or falls today.  HPI     Home Medications Prior to Admission medications   Medication Sig Start Date End Date Taking? Authorizing Provider  amLODipine (NORVASC) 10 MG tablet Take 1 tablet (10 mg total) by mouth daily. 04/03/21   Shawna Clamp, MD  Cholecalciferol 25 MCG (1000 UT) tablet Take 1,000 Units by mouth daily. 10/26/21   [provider]  famotidine (PEPCID) 20 MG tablet Take 1 tablet (20 mg total) by mouth 2 (two) times daily. 04/09/22 05/09/22  Wyvonnia Dusky, MD  folic acid (FOLVITE) 1 MG tablet Take 1 tablet (1 mg total) by mouth daily. 11/04/21   Armando Reichert, MD  gabapentin (NEURONTIN) 300 MG capsule Take 300 mg by mouth 3 (three) times daily. 10/26/21   [provider]  methocarbamol (ROBAXIN) 500 MG tablet Take 500 mg by mouth 2 (two) times daily. 10/26/21   [provider]  mirtazapine (REMERON) 15 MG tablet Take  15 mg by mouth at bedtime. 10/26/21   [provider]  Multiple Vitamin (MULTIVITAMIN WITH MINERALS) TABS tablet Take 1 tablet by mouth daily.    [provider]  pantoprazole (PROTONIX) 40 MG tablet Take 1 tablet (40 mg total) by mouth daily. 04/03/21   Shawna Clamp, MD  QUEtiapine (SEROQUEL) 50 MG tablet Take 50 mg by mouth at bedtime. 11/29/21   [provider]  thiamine 100 MG tablet Take 1 tablet (100 mg total) by mouth daily. 11/04/21   Armando Reichert, MD      Allergies    Oxycodone hcl and Ativan [lorazepam]    Review of Systems   Review of Systems  Psychiatric/Behavioral:  Positive for dysphoric mood, hallucinations and suicidal ideas.     Physical Exam Updated Vital Signs BP (!) 148/110 (BP Location: Right Arm)   Pulse 82   Temp 97.8 F (36.6 C) (Oral)   Resp 18   SpO2 100%  Physical Exam Vitals and nursing note reviewed.  Constitutional:      Appearance: He is not ill-appearing or toxic-appearing.  HENT:     Head: Normocephalic and atraumatic.     Mouth/Throat:     Mouth: Mucous membranes are moist.     Pharynx: No oropharyngeal exudate or posterior oropharyngeal erythema.  Eyes:     General:        Right eye: No discharge.        Left eye: No discharge.     Conjunctiva/sclera:  Conjunctivae normal.  Cardiovascular:     Rate and Rhythm: Normal rate and regular rhythm.     Pulses: Normal pulses.     Heart sounds: Normal heart sounds. No murmur heard. Pulmonary:     Effort: Pulmonary effort is normal. No respiratory distress.     Breath sounds: Normal breath sounds. No wheezing or rales.  Abdominal:     General: Bowel sounds are normal. There is no distension.     Palpations: Abdomen is soft.     Tenderness: There is no abdominal tenderness. There is no guarding or rebound.  Musculoskeletal:        General: No deformity.     Cervical back: Neck supple.  Skin:    General: Skin is warm and dry.     Capillary Refill: Capillary refill  takes less than 2 seconds.  Neurological:     General: No focal deficit present.     Mental Status: He is alert and oriented to person, place, and time. Mental status is at baseline.  Psychiatric:        Mood and Affect: Mood normal. Affect is flat.        Speech: Speech normal.        Behavior: Behavior is cooperative.        Thought Content: Thought content includes suicidal ideation. Thought content does not include homicidal ideation. Thought content includes suicidal plan.     Comments: Does not appear to responding to internal stimuli at this time.     ED Results / Procedures / Treatments   Labs (all labs ordered are listed, but only abnormal results are displayed) Labs Reviewed  COMPREHENSIVE METABOLIC PANEL - Abnormal; Notable for the following components:      Result Value   Potassium 3.4 (*)    Calcium 8.7 (*)    All other components within normal limits  ETHANOL - Abnormal; Notable for the following components:   Alcohol, Ethyl (B) 344 (*)    All other components within normal limits  SALICYLATE LEVEL - Abnormal; Notable for the following components:   Salicylate Lvl <2.5 (*)    All other components within normal limits  ACETAMINOPHEN LEVEL - Abnormal; Notable for the following components:   Acetaminophen (Tylenol), Serum <10 (*)    All other components within normal limits  RESP PANEL BY RT-PCR (FLU A&B, COVID) ARPGX2  CBC  RAPID URINE DRUG SCREEN, HOSP PERFORMED    EKG None  Radiology No results found.  Procedures Procedures    Medications Ordered in ED Medications  LORazepam (ATIVAN) injection 0-4 mg ( Intravenous Not Given 06/27/22 0523)    Or  LORazepam (ATIVAN) tablet 0-4 mg ( Oral See Alternative 06/27/22 0523)  LORazepam (ATIVAN) injection 0-4 mg (has no administration in time range)    Or  LORazepam (ATIVAN) tablet 0-4 mg (has no administration in time range)  thiamine (VITAMIN B1) tablet 100 mg (has no administration in time range)    Or   thiamine (VITAMIN B1) injection 100 mg (has no administration in time range)    ED Course/ Medical Decision Making/ A&P                           Medical Decision Making  41 year old male presents with suicidality, under IVC from [employer].  Hypertensive on intake, improved at time my evaluation.  Vital signs otherwise normal.  Cardiopulmonary and abdominal exams are benign.  Patient neurovascular intact in all  extremities and alert and oriented x3.  Suicidal on interview but no evidence of homicidality or interaction with internal stimuli at this time.  Patient with flat affect but very cooperative and pleasant with this provider.  Medically cleared at this time, will require further metabolization of his alcohol but is alert and oriented and cooperative.  Pending psychiatric evaluation for safe disposition planning.  Amount and/or Complexity of Data Reviewed Labs: ordered.    Details: CBC without leukocytosis or anemia, CMP unremarkable.  Salicylate and acetaminophen levels are normal, alcohol level elevated to 344.   Risk OTC drugs. Prescription drug management.   Patient continues to sleep comfortably in his hospital bed, normal vital signs.  At this time patient remains under IVC.  He is awaiting psychiatric evaluation for safe disposition planning.  CIWA protocol in place.  Care of patient signed out to oncoming day team for follow-up on psych recommendations.  Rodger voiced understanding of her medical evaluation and treatment plan. Each of their questions answered to their expressed satisfaction.  Return precautions were given.   This chart was dictated using voice recognition software, Dragon. Despite the best efforts of this provider to proofread and correct errors, errors may still occur which can change documentation meaning.  Final Clinical Impression(s) / ED Diagnoses Final diagnoses:  None    Rx / DC Orders ED Discharge Orders     None          Aura Dials 06/27/22 6440    Quintella Reichert, MD 06/27/22 [phone]

## 2022-06-27 NOTE — ED Notes (Signed)
Provdied another update of pt's BP: 143/94 Pulse Rate: 66 .Marland Kitchen..pt remains at rest, NAD noted, has no complaints. Awaiting update as to when [facility] is ready for pt to be transfer to their facility

## 2022-06-27 NOTE — Progress Notes (Signed)
Pt was accepted to [facility] 06/27/22; Bed ASSIGNMENT 302-1 tonight  Pt meets inpatient criteria per Suzzanne Cloud  Attending Physician will be Dr. Janine Limbo  Report can be called to: Adult unit: [id number]  Pt can arrive after: Night [facility] will coordniate  Care Team notified: [place] Lynnda Shields, RN, Suzzanne Cloud, Catha Nottingham, RN, Lynford Humphrey, RN, Aggie Moats, RN  [place], [place] 06/27/2022 @ 3:47 PM

## 2022-06-27 NOTE — ED Notes (Signed)
Pt changed into burgundy scrubs. Belongings placed in 2 white pt belonging bags and placed behind triage nurse station. Wallet and phone are placed in clear bag labeled and given to security. Pt wanded by security.

## 2022-06-27 NOTE — ED Notes (Signed)
[facility] called to arrange transport for pt to be transported to [facility] by [employer] d/t to pt being IVC. Awaiting transport at this time

## 2022-06-27 NOTE — ED Notes (Signed)
Pt given meal try, did not like the fish, pt given [place] sandwich instead, pt grateful

## 2022-06-27 NOTE — ED Notes (Signed)
BP 141/102, DBP high per [facility], they will not take Mr. Charles Graves until his BP improves, CIWA 6, did give 1 mg ativan just now (refer to [facility]) pt has been asleep, NAD noted. Dr. Oswald Hillock has been notified

## 2022-06-27 NOTE — ED Triage Notes (Signed)
Pt brought in by [employer] for SI, called [employer] out and requeted that officers shoot him in the head. Pt also has a plan to run out in front of traffic, pt reports AVH, denies HI. Pt reports that he has been off his meds for the past 3 days. Pt reports that he drinks a pint of liquor every days, last drink a few hours ago.

## 2022-06-27 NOTE — ED Notes (Signed)
Update given to [facility] intake team by secure chat, regarding pt's now current BP/HR, medications given, and current status. Awaiting response

## 2022-06-28 ENCOUNTER — Encounter (HOSPITAL_COMMUNITY): Payer: Self-pay

## 2022-06-28 ENCOUNTER — Inpatient Hospital Stay (HOSPITAL_COMMUNITY)
Admission: AD | Admit: 2022-06-28 | Discharge: 2022-07-03 | DRG: 885 | Disposition: A | Discharge status: Home / Self Care | Payer: PRIVATE HEALTH INSURANCE | Source: Intra-hospital | Attending: Psychiatry | Admitting: Psychiatry

## 2022-06-28 ENCOUNTER — Other Ambulatory Visit: Payer: Self-pay

## 2022-06-28 ENCOUNTER — Encounter (HOSPITAL_COMMUNITY): Payer: Self-pay | Admitting: Psychiatry

## 2022-06-28 DIAGNOSIS — F322 Major depressive disorder, single episode, severe without psychotic features: Secondary | ICD-10-CM | POA: Diagnosis present

## 2022-06-28 DIAGNOSIS — Z888 Allergy status to other drugs, medicaments and biological substances status: Secondary | ICD-10-CM

## 2022-06-28 DIAGNOSIS — F411 Generalized anxiety disorder: Secondary | ICD-10-CM | POA: Diagnosis present

## 2022-06-28 DIAGNOSIS — Z79899 Other long term (current) drug therapy: Secondary | ICD-10-CM | POA: Diagnosis not present

## 2022-06-28 DIAGNOSIS — K5900 Constipation, unspecified: Secondary | ICD-10-CM | POA: Diagnosis present

## 2022-06-28 DIAGNOSIS — R45851 Suicidal ideations: Secondary | ICD-10-CM | POA: Diagnosis present

## 2022-06-28 DIAGNOSIS — F17210 Nicotine dependence, cigarettes, uncomplicated: Secondary | ICD-10-CM | POA: Diagnosis present

## 2022-06-28 DIAGNOSIS — F10239 Alcohol dependence with withdrawal, unspecified: Secondary | ICD-10-CM | POA: Diagnosis present

## 2022-06-28 DIAGNOSIS — I10 Essential (primary) hypertension: Secondary | ICD-10-CM | POA: Diagnosis present

## 2022-06-28 DIAGNOSIS — Z20822 Contact with and (suspected) exposure to covid-19: Secondary | ICD-10-CM | POA: Diagnosis present

## 2022-06-28 DIAGNOSIS — G4700 Insomnia, unspecified: Secondary | ICD-10-CM | POA: Diagnosis present

## 2022-06-28 DIAGNOSIS — Z87828 Personal history of other (healed) physical injury and trauma: Secondary | ICD-10-CM | POA: Diagnosis not present

## 2022-06-28 DIAGNOSIS — F1010 Alcohol abuse, uncomplicated: Secondary | ICD-10-CM | POA: Diagnosis present

## 2022-06-28 DIAGNOSIS — K30 Functional dyspepsia: Secondary | ICD-10-CM | POA: Diagnosis present

## 2022-06-28 HISTORY — DX: Nontraumatic subarachnoid hemorrhage, unspecified: I60.9

## 2022-06-28 MED ORDER — LOPERAMIDE HCL 2 MG PO CAPS: 2.0000 mg | ORAL_CAPSULE | ORAL | Status: AC | PRN

## 2022-06-28 MED ORDER — THIAMINE HCL 100 MG/ML IJ SOLN: 100.0000 mg | Freq: Once | INTRAMUSCULAR | Status: DC

## 2022-06-28 MED ORDER — HYDROXYZINE HCL 25 MG PO TABS
25.0000 mg | ORAL_TABLET | Freq: Four times a day (QID) | ORAL | Status: AC | PRN
  Administered 2022-06-28 – 2022-06-30 (×5): 25 mg via ORAL
  Filled 2022-06-28 (×5): qty 1

## 2022-06-28 MED ORDER — QUETIAPINE FUMARATE 50 MG PO TABS
50.0000 mg | ORAL_TABLET | Freq: Every day | ORAL | Status: DC
  Administered 2022-06-28 – 2022-06-29 (×2): 50 mg via ORAL
  Filled 2022-06-28 (×3): qty 1

## 2022-06-28 MED ORDER — POTASSIUM CHLORIDE CRYS ER 20 MEQ PO TBCR
40.0000 meq | EXTENDED_RELEASE_TABLET | Freq: Once | ORAL | Status: AC
  Administered 2022-06-28: 40 meq via ORAL
  Filled 2022-06-28 (×2): qty 2

## 2022-06-28 MED ORDER — VITAMIN B-1 100 MG PO TABS
100.0000 mg | ORAL_TABLET | Freq: Every day | ORAL | Status: DC
  Administered 2022-06-29 – 2022-07-03 (×5): 100 mg via ORAL
  Filled 2022-06-28 (×6): qty 1

## 2022-06-28 MED ORDER — DULOXETINE HCL 60 MG PO CPEP
60.0000 mg | ORAL_CAPSULE | Freq: Every day | ORAL | Status: DC
  Administered 2022-06-28 – 2022-07-03 (×6): 60 mg via ORAL
  Filled 2022-06-28 (×8): qty 1

## 2022-06-28 MED ORDER — MAGNESIUM HYDROXIDE 400 MG/5ML PO SUSP: 30.0000 mL | Freq: Every day | ORAL | Status: DC | PRN

## 2022-06-28 MED ORDER — ADULT MULTIVITAMIN W/MINERALS CH
1.0000 | ORAL_TABLET | Freq: Every day | ORAL | Status: DC
  Administered 2022-06-29 – 2022-07-03 (×5): 1 via ORAL
  Filled 2022-06-28 (×7): qty 1

## 2022-06-28 MED ORDER — CHLORDIAZEPOXIDE HCL 25 MG PO CAPS
25.0000 mg | ORAL_CAPSULE | Freq: Three times a day (TID) | ORAL | Status: DC
  Administered 2022-06-28 – 2022-06-30 (×5): 25 mg via ORAL
  Filled 2022-06-28 (×5): qty 1

## 2022-06-28 MED ORDER — AMLODIPINE BESYLATE 10 MG PO TABS
10.0000 mg | ORAL_TABLET | Freq: Every day | ORAL | Status: DC
  Administered 2022-06-28 – 2022-07-01 (×4): 10 mg via ORAL
  Filled 2022-06-28 (×8): qty 1

## 2022-06-28 MED ORDER — ALUM & MAG HYDROXIDE-SIMETH 200-200-20 MG/5ML PO SUSP: 30.0000 mL | ORAL | Status: DC | PRN

## 2022-06-28 MED ORDER — CHLORDIAZEPOXIDE HCL 25 MG PO CAPS
25.0000 mg | ORAL_CAPSULE | Freq: Four times a day (QID) | ORAL | Status: AC | PRN
  Administered 2022-06-28: 25 mg via ORAL
  Filled 2022-06-28: qty 1

## 2022-06-28 MED ORDER — ONDANSETRON 4 MG PO TBDP
4.0000 mg | ORAL_TABLET | Freq: Four times a day (QID) | ORAL | Status: AC | PRN
  Administered 2022-06-28: 4 mg via ORAL
  Filled 2022-06-28: qty 1

## 2022-06-28 MED ORDER — ACETAMINOPHEN 325 MG PO TABS: 650.0000 mg | ORAL_TABLET | Freq: Four times a day (QID) | ORAL | Status: DC | PRN

## 2022-06-28 NOTE — H&P (Signed)
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 will improve, Ability to maintain clinical measurements within normal limits will improve, Compliance with prescribed medications will improve, and Ability to identify triggers associated with substance abuse/mental health issues will improve  Plan:  Safety and Monitoring: Voluntary admission to inpatient psychiatric unit for safety, stabilization and treatment Daily contact with patient to assess and evaluate symptoms and progress in treatment Patient's case to be discussed in multi-disciplinary team  meeting Observation Level : q15 minute checks Vital signs: q12 hours Precautions: suicide, elopement, and assault   Principal Diagnosis: MDD (major depressive disorder), severe ([place]) Diagnosis:  Principal Problem:   MDD (major depressive disorder), severe ([place]) Active Problems:   Alcohol abuse  #Major depressive disorder, severe -Start Cymbalta 60 mg daily for depression, anxiety, and for mood stability -Start Seroquel 50 mg at bedtime for depression and mood stability  #Alcohol abuse -CIWA protocol initiated -Chlordiazepoxide 25 mg 3 times daily then to be titrated to 1 mg 2 times daily for 1 day the following day depending on CIWA score -Provider encouraged alcohol cessation  #Hypertension -Start patient's home med: Amlodipine 10 mg daily  As needed medications: -Patient to continue taking Tylenol 650 mg every 6 hours as needed for mild pain -Patient to continue taking Maalox/Mylanta 30 mL every 4 hours as needed for indigestion -Patient to continue taking Milk of Magnesia 30 mL as needed for mild constipation -Hydroxyzine 25 mg 3 times daily as needed for anxiety  Labs to be ordered -Hemoglobin A1c -Lipid panel -Potassium level  Discharge Planning: Social work and case management to assist with discharge planning and identification of hospital follow-up needs prior to discharge Estimated LOS: 5-7 days Discharge Concerns: Need to establish a safety plan; Medication compliance and effectiveness Discharge Goals: Return home with outpatient referrals for mental health follow-up including medication management/psychotherapy   I certify that inpatient services furnished can reasonably be expected to improve the patient's condition.    Malachy Mood, PA 10/18/20234:37 PM

## 2022-06-28 NOTE — B[facility] Group Notes (Signed)
Patient attended NA meeting.

## 2022-06-28 NOTE — Group Note (Signed)
Recreation Therapy Group Note   Group Topic:Stress Management  Group Date: 06/28/2022 Start Time: 0930 End Time: 0950 Facilitators: Sufyaan Palma-McCall, LRT,CTRS Location: [address] Dayroom   Goal Area(s) Addresses:  Patient will identify positive stress management techniques. Patient will identify benefits of using stress management post d/c.  Group Description:  Meditation.  LRT played a meditation that focused on having a mindful morning.  Patients were to listen and follow along as meditation played to fully engage in the group session.  Patients were to relax and get as comfortable as possible to get the full experience of the meditation.   Affect/Mood: N/A   Participation Level: Did not attend    Clinical Observations/Individualized Feedback:     Plan: Continue to engage patient in RT group sessions 2-3x/week.   Melana Hingle-McCall, LRT,CTRS 06/28/2022 12:07 PM

## 2022-06-28 NOTE — ED Notes (Signed)
Receiving RN Irine Seal, RN [facility] nurse) has agreed to accept [facility] once pt has arrived to facility, all questions and concerns address. [employer] will need to transport d/t pt IVC status

## 2022-06-28 NOTE — ED Notes (Signed)
Nash Mantis, Rn with [facility]  notified that [employer] has arrived to transport pt to [facility], pt alert, NAD noted, belongings given to [employer], VSS, steady gait observed.

## 2022-06-28 NOTE — BH IP Treatment Plan (Signed)
Interdisciplinary Treatment and Diagnostic Plan Update  06/28/2022 Time of Session: [place] MRN: [id number]  Principal Diagnosis: <principal problem not specified>  Secondary Diagnoses: Active Problems:   MDD (major depressive disorder), severe ([facility])   Current Medications:  Current Facility-Administered Medications  Medication Dose Route Frequency Provider Last Rate Last Admin   acetaminophen (TYLENOL) tablet 650 mg  650 mg Oral Q6H PRN Leevy-Johnson, Brooke A, NP       alum & mag hydroxide-simeth (MAALOX/MYLANTA) 200-200-20 MG/5ML suspension 30 mL  30 mL Oral Q4H PRN Leevy-Johnson, Brooke A, NP       chlordiazePOXIDE (LIBRIUM) capsule 25 mg  25 mg Oral Q6H PRN Leevy-Johnson, Brooke A, NP   25 mg at 06/28/22 0940   hydrOXYzine (ATARAX) tablet 25 mg  25 mg Oral Q6H PRN Leevy-Johnson, Brooke A, NP       loperamide (IMODIUM) capsule 2-4 mg  2-4 mg Oral PRN Leevy-Johnson, Brooke A, NP       magnesium hydroxide (MILK OF MAGNESIA) suspension 30 mL  30 mL Oral Daily PRN Leevy-Johnson, Brooke A, NP       multivitamin with minerals tablet 1 tablet  1 tablet Oral Daily Leevy-Johnson, Brooke A, NP       ondansetron (ZOFRAN-ODT) disintegrating tablet 4 mg  4 mg Oral Q6H PRN Leevy-Johnson, Brooke A, NP   4 mg at 06/28/22 0940   [START ON 06/29/2022] thiamine (Vitamin B-1) tablet 100 mg  100 mg Oral Daily Leevy-Johnson, Brooke A, NP       thiamine (VITAMIN B1) injection 100 mg  100 mg Intramuscular Once Leevy-Johnson, Brooke A, NP       PTA Medications: Medications Prior to Admission  Medication Sig Dispense Refill Last Dose   amLODipine (NORVASC) 10 MG tablet Take 1 tablet (10 mg total) by mouth daily. 30 tablet 1    DULoxetine HCl 40 MG CPEP Take 40 mg by mouth daily.      famotidine (PEPCID) 20 MG tablet Take 1 tablet (20 mg total) by mouth 2 (two) times daily. (Patient not taking: Reported on 06/27/2022) 60 tablet 0    folic acid (FOLVITE) 1 MG tablet Take 1 tablet (1 mg total) by  mouth daily. (Patient not taking: Reported on 06/27/2022)      HYDRALAZINE HCL PO Take 1 tablet by mouth 4 (four) times daily.      pantoprazole (PROTONIX) 40 MG tablet Take 1 tablet (40 mg total) by mouth daily. (Patient not taking: Reported on 06/27/2022) 90 tablet 0     Patient Stressors: Financial difficulties   Marital or family conflict   Substance abuse    Patient Strengths: Ability for insight  Average or above average intelligence  Marketing executive fund of knowledge   Treatment Modalities: Medication Management, Group therapy, Case management,  1 to 1 session with clinician, Psychoeducation, Recreational therapy.   Physician Treatment Plan for Primary Diagnosis: <principal problem not specified> Long Term Goal(s):     Short Term Goals:    Medication Management: Evaluate patient's response, side effects, and tolerance of medication regimen.  Therapeutic Interventions: 1 to 1 sessions, Unit Group sessions and Medication administration.  Evaluation of Outcomes: Progressing  Physician Treatment Plan for Secondary Diagnosis: Active Problems:   MDD (major depressive disorder), severe ([place])  Long Term Goal(s):     Short Term Goals:       Medication Management: Evaluate patient's response, side effects, and tolerance of medication regimen.  Therapeutic Interventions: 1 to 1 sessions, Unit Group  sessions and Medication administration.  Evaluation of Outcomes: Progressing   RN Treatment Plan for Primary Diagnosis: <principal problem not specified> Long Term Goal(s): Knowledge of disease and therapeutic regimen to maintain health will improve  Short Term Goals: Ability to remain free from injury will improve, Ability to verbalize frustration and anger appropriately will improve, Ability to demonstrate self-control, Ability to participate in decision making will improve, Ability to verbalize feelings will improve, Ability to disclose and discuss suicidal ideas,  Ability to identify and develop effective coping behaviors will improve, and Compliance with prescribed medications will improve  Medication Management: RN will administer medications as ordered by provider, will assess and evaluate patient's response and provide education to patient for prescribed medication. RN will report any adverse and/or side effects to prescribing provider.  Therapeutic Interventions: 1 on 1 counseling sessions, Psychoeducation, Medication administration, Evaluate responses to treatment, Monitor vital signs and CBGs as ordered, Perform/monitor CIWA, COWS, AIMS and Fall Risk screenings as ordered, Perform wound care treatments as ordered.  Evaluation of Outcomes: Progressing   LCSW Treatment Plan for Primary Diagnosis: <principal problem not specified> Long Term Goal(s): Safe transition to appropriate next level of care at discharge, Engage patient in therapeutic group addressing interpersonal concerns.  Short Term Goals: Engage patient in aftercare planning with referrals and resources, Increase social support, Increase ability to appropriately verbalize feelings, Increase emotional regulation, Facilitate acceptance of mental health diagnosis and concerns, Facilitate patient progression through stages of change regarding substance use diagnoses and concerns, Identify triggers associated with mental health/substance abuse issues, and Increase skills for wellness and recovery  Therapeutic Interventions: Assess for all discharge needs, 1 to 1 time with Social worker, Explore available resources and support systems, Assess for adequacy in community support network, Educate family and significant other(s) on suicide prevention, Complete Psychosocial Assessment, Interpersonal group therapy.  Evaluation of Outcomes: Progressing   Progress in Treatment: Attending groups: No. Participating in groups: No. Taking medication as prescribed: Yes. Toleration medication:  Yes. Family/Significant other contact made: No, will contact:  CSW will obtain consent  Patient understands diagnosis: Yes. Discussing patient identified problems/goals with staff: Yes. Medical problems stabilized or resolved: Yes. Denies suicidal/homicidal ideation: No. Issues/concerns per patient self-inventory: Yes. Other: none  New problem(s) identified: No, Describe:  none  New Short Term/Long Term Goal(s): Patient to work towards detox, medication management for mood stabilization; elimination of SI thoughts; development of comprehensive mental wellness/sobriety plan.  Patient Goals:  Patient states their goal for treatment is to "\try different treatment for alcohol."  Discharge Plan or Barriers: No psychosocial barriers identified at this time, patient to return to place of residence when appropriate for discharge.   Reason for Continuation of Hospitalization: Depression Medication stabilization Other; describe active substance use  Estimated Length of Stay: 1-7 days   Last 3 [place] Suicide Severity Risk Score: [place] Admission (Current) from 06/28/2022 in [facility] ED from 06/27/2022 in [facility] DEPT ED from 06/17/2022 in [facility] High Risk High Risk No Risk       Last PHQ 2/9 Scores:    11/18/2021   11:15 AM  Depression screen PHQ 2/9  Decreased Interest 1  Down, Depressed, Hopeless 2  PHQ - 2 Score 3  Altered sleeping 2  Tired, decreased energy 1  Change in appetite 1  Feeling bad or failure about yourself  1  Trouble concentrating 1  Moving slowly or fidgety/restless 0  Suicidal  thoughts 1  PHQ-9 Score 10  Difficult doing work/chores Somewhat difficult    Scribe for Treatment Team: Larose Kells 06/28/2022 12:58 PM

## 2022-06-28 NOTE — Plan of Care (Signed)
  Problem: Activity: Goal: Interest or engagement in activities will improve Outcome: Not Progressing   Problem: Coping: Goal: Ability to verbalize frustrations and anger appropriately will improve Outcome: Progressing   Problem: Coping: Goal: Ability to demonstrate self-control will improve Outcome: Progressing   Problem: Safety: Goal: Periods of time without injury will increase Outcome: Progressing   Problem: Coping: Goal: Will verbalize feelings Outcome: Progressing

## 2022-06-28 NOTE — Tx Team (Signed)
Initial Treatment Plan 06/28/2022 3:10 AM Levada Dy NMM:[id number]    PATIENT STRESSORS: Financial difficulties   Marital or family conflict   Substance abuse     PATIENT STRENGTHS: Ability for insight  Average or above average intelligence  Communication skills  General fund of knowledge    PATIENT IDENTIFIED PROBLEMS: Substance Abuse  Suicidal Ideation                   DISCHARGE CRITERIA:  Improved stabilization in mood, thinking, and/or behavior  PRELIMINARY DISCHARGE PLAN: Outpatient therapy  PATIENT/FAMILY INVOLVEMENT: This treatment plan has been presented to and reviewed with the patient, Charles Graves, and/or family members.  The patient and family have been given the opportunity to ask questions and make suggestions.  Scherrie November, RN 06/28/2022, 3:10 AM

## 2022-06-28 NOTE — Progress Notes (Signed)
   06/28/22 2000  Psych Admission Type (Psych Patients Only)  Admission Status Involuntary  Psychosocial Assessment  Patient Complaints Anxiety;Depression;Substance abuse  Eye Contact Brief  Facial Expression Anxious  Affect Depressed  Speech Logical/coherent  Interaction Minimal  Motor Activity Restless  Appearance/Hygiene Unremarkable  Behavior Characteristics Cooperative  Mood Depressed  Aggressive Behavior  Effect No apparent injury  Thought Process  Coherency WDL  Content WDL  Delusions WDL  Perception WDL  Hallucination None reported or observed  Judgment WDL  Confusion None  Danger to Self  Current suicidal ideation? Denies  Danger to Others  Danger to Others None reported or observed

## 2022-06-28 NOTE — Group Note (Signed)
LCSW Group Therapy Note  Group Date: 06/28/2022 Start Time: 1300 End Time: 1400   Type of Therapy and Topic:  Group Therapy: Positive Affirmations  Participation Level:  Did Not Attend   Description of Group:   This group addressed positive affirmation towards self and others.  Patients went around the room and identified two positive things about themselves and two positive things about a peer in the room.  Patients reflected on how it felt to share something positive with others, to identify positive things about themselves, and to hear positive things from others/ Patients were encouraged to have a daily reflection of positive characteristics or circumstances.   Therapeutic Goals: Patients will verbalize two of their positive qualities Patients will demonstrate empathy for others by stating two positive qualities about a peer in the group Patients will verbalize their feelings when voicing positive self affirmations and when voicing positive affirmations of others Patients will discuss the potential positive impact on their wellness/recovery of focusing on positive traits of self and others    Windle Guard, LCSW 06/28/2022  3:33 PM

## 2022-06-28 NOTE — Plan of Care (Signed)
  Problem: Activity: Goal: Sleeping patterns will improve Outcome: Progressing   Problem: Safety: Goal: Periods of time without injury will increase Outcome: Progressing

## 2022-06-28 NOTE — BHH Group Notes (Signed)
Adult Psychoeducational Group Note  Date:  06/28/2022 Time:  11:44 AM  Group Topic/Focus:  Goals Group:   The focus of this group is to help patients establish daily goals to achieve during treatment and discuss how the patient can incorporate goal setting into their daily lives to aide in recovery.  Participation Level:  Did Not Attend  Participation Quality:   Did not attend  Affect:   Did not attend   Cognitive:   Did not attend  Insight: None  Engagement in Group:   Did not attend  Modes of Intervention:   Did not attend  Additional Comments:  Pt did not attend Goal group.  Donatella Walski, Georgiann Mccoy 06/28/2022, 11:44 AM

## 2022-06-28 NOTE — BHH Group Notes (Signed)
Pt did not attend group. 

## 2022-06-28 NOTE — Progress Notes (Signed)
Patient ID: Charles Graves, male   DOB: 1981/07/24, 41 y.o.   MRN: [id number]  Patient IVC from [facility] admitted to [facility] for ETOH and suicidal ideation asking [employer] to "shoot him in the head."  Patient reports AVH intermittently for the last few days.  Patient drinks pints of liquor daily which has negatively impacted his relationship with his family.  Patient BAL was 344 on admission.  He does have a history of delirium tremens with detox.  Patient calm and cooperative on admission.  He voices financial concerns which impact his medication management, food intake, and transportation needs.  Patient currently lives with a friend, however he does not feel stable in the living arrangement and thinks he could be evicted at any moment.  Patient reports several falls which he reports happens when he is not drinking with his last fall occurring 2 days prior to admission.  Patient oriented to unit.  No distress noted.  Meal tray and drink provided.

## 2022-06-28 NOTE — [facility] Suicide Risk Assessment (Signed)
Suicide Risk Assessment  Admission Assessment    [facility] Admission Suicide Risk Assessment   Nursing information obtained from:  Patient Demographic factors:  Male, Caucasian, Gay, lesbian, or bisexual orientation, Low socioeconomic status Current Mental Status:  Suicidal ideation indicated by patient Loss Factors:  Financial problems / change in socioeconomic status Historical Factors:  NA Risk Reduction Factors:  Living with another person, especially a relative  Total Time spent with patient: 45 minutes Principal Problem: MDD (major depressive disorder), severe ([place]) Diagnosis:  Principal Problem:   MDD (major depressive disorder), severe ([place]) Active Problems:   Alcohol abuse  Subjective Data:   Charles Graves is a 41 year old male with a past psychiatric history significant for severe alcohol dependence and major depressive disorder who was admitted under IVC to [facility] from [facility] for suicidal ideations with a plan to ask [employer] to shoot him in the head.   Patient reports that he was admitted to [facility] due to alcohol abuse.  Patient added that in addition to his alcohol abuse, he was admitted to the hospital due to suicidal thoughts with a plan to run into traffic.  Patient states that he has been dealing with suicidal ideations for the past couple of days.  He reports that his suicidal thoughts are triggered by alcohol.  Patient elaborated that alcohol consumption and being without alcohol both trigger suicidal thoughts.  Patient states that he did not go through with his suicidal ideation because he wanted to see where his life was going to end up and he does not think that it is his time to go.  In addition to suicidal ideations, patient endorses depression that has been going on for several months.  The biggest triggers to his depression include the loss of this his grandmother and his dog.  Patient states that his family does not want to have anything to do with  him.  Patient endorses the following depressive symptoms: insomnia, lack of interest in activities or hobbies, depressed mood, feelings of guilt/worthlessness, hopelessness, decreased concentration, self-isolation, decreased appetite, psychomotor agitation, psychomotor retardation, memory issues, and suicidal ideations.   In addition to his depressive symptoms, patient reports that he has been diagnosed with manic depression and endorses the following symptoms: distractibility, elevated mood, racing thoughts, financial extravagance, irritability, mood swings, and sleep deficits.  Patient reports that his last manic episode was a month ago but he is unable to recall what happened during his manic episode.  Patient endorses anxiety and rates his anxiety an 8 out of 10.  Patient's anxiety consist of the following symptoms: agoraphobic, excessive worrying, and social anxiety.  Patient also endorses panic attacks stating that he has at least 2 panic attacks per day.  Patient is unable to identify any discernible triggers to his panic attacks.  Patient denies suicidal or homicidal ideations.  He further denies auditory or visual hallucinations and does not appear to be responding to internal/external stimuli.  Patient endorses some paranoia but denies delusional thoughts.  Patient reports that his sleep has not been that great and endorses fair appetite.  Continued Clinical Symptoms:  Patient continues to endorse depressive and anxiety but denies suicidal ideations at this time.  Patient appears diaphoretic and has a significant history of past alcohol withdrawal symptoms.  Alcohol Use Disorder Identification Test Final Score (AUDIT): 40 The "Alcohol Use Disorders Identification Test", Guidelines for Use in Primary Care, Second Edition.  World Pharmacologist [facility]). Score between 0-7:  no or low risk  or alcohol related problems. Score between 8-15:  moderate risk of alcohol related problems. Score between  16-19:  high risk of alcohol related problems. Score 20 or above:  warrants further diagnostic evaluation for alcohol dependence and treatment.   CLINICAL FACTORS:   Severe Anxiety and/or Agitation Depression:   Anhedonia Comorbid alcohol abuse/dependence Hopelessness Insomnia Severe Alcohol/Substance Abuse/Dependencies   Musculoskeletal: Strength & Muscle Tone: within normal limits Gait & Station: unsteady Patient leans: N/A  Psychiatric Specialty Exam:  Presentation  General Appearance:  Appropriate for Environment; Casual  Eye Contact: Fair  Speech: Clear and Coherent  Speech Volume: Normal  Handedness: Right   Mood and Affect  Mood: Dysphoric; Anxious  Affect: Blunt; Congruent; Depressed   Thought Process  Thought Processes: Linear; Goal Directed  Descriptions of Associations:Intact  Orientation:Full (Time, Place and Person)  Thought Content:Logical  History of Schizophrenia/Schizoaffective disorder:No  Duration of Psychotic Symptoms:No data recorded Hallucinations:Hallucinations: None  Ideas of Reference:None  Suicidal Thoughts:Suicidal Thoughts: No  Homicidal Thoughts:Homicidal Thoughts: No   Sensorium  Memory: Immediate Fair; Recent Fair; Remote Fair  Judgment: Fair  Insight: Shallow   Executive Functions  Concentration: Good  Attention Span: Good  Recall: Fair  Fund of Knowledge: Fair  Language: Good   Psychomotor Activity  Psychomotor Activity: Psychomotor Activity: Normal   Assets  Assets: Desire for Improvement; Communication Skills; Resilience; Physical Health   Sleep  Sleep: Sleep: Fair    Physical Exam: Physical Exam Constitutional:      Appearance: Normal appearance.  HENT:     Head: Normocephalic and atraumatic.     Nose: Nose normal.     Mouth/Throat:     Mouth: Mucous membranes are moist.  Eyes:     Extraocular Movements: Extraocular movements intact.     Pupils: Pupils are  equal, round, and reactive to light.  Cardiovascular:     Rate and Rhythm: Normal rate and regular rhythm.  Pulmonary:     Effort: Pulmonary effort is normal.     Breath sounds: Normal breath sounds.  Abdominal:     General: Abdomen is flat.  Musculoskeletal:        General: Normal range of motion.     Cervical back: Normal range of motion and neck supple.  Skin:    General: Skin is warm and dry.  Neurological:     General: No focal deficit present.  Psychiatric:        Attention and Perception: Attention and perception normal. He does not perceive auditory or visual hallucinations.        Mood and Affect: Mood is anxious and depressed. Affect is blunt.        Speech: Speech normal.        Behavior: Behavior normal. Behavior is cooperative.        Thought Content: Thought content normal. Thought content is not paranoid or delusional. Thought content does not include homicidal or suicidal ideation.        Cognition and Memory: Cognition and memory normal.        Judgment: Judgment normal.    Review of Systems  Constitutional: Negative.   HENT: Negative.    Eyes: Negative.   Respiratory: Negative.    Cardiovascular: Negative.   Gastrointestinal: Negative.   Skin: Negative.   Neurological: Negative.   Psychiatric/Behavioral:  Positive for depression and substance abuse. Negative for hallucinations and suicidal ideas. The patient is nervous/anxious and has insomnia.    Blood pressure (!) 131/99, pulse 73, temperature 98.5 F (36.9 C),  temperature source Oral, resp. rate 20, height '5\' 8"'$  (1.727 m), weight 55.3 kg, SpO2 100 %. Body mass index is 18.55 kg/m.   COGNITIVE FEATURES THAT CONTRIBUTE TO RISK:  None    SUICIDE RISK:   Severe:  Frequent, intense, and enduring suicidal ideation, specific plan, no subjective intent, but some objective markers of intent (i.e., choice of lethal method), the method is accessible, some limited preparatory behavior, evidence of impaired  self-control, severe dysphoria/symptomatology, multiple risk factors present, and few if any protective factors, particularly a lack of social support.  PLAN OF CARE:  Daily contact with patient to assess and evaluate symptoms and progress in treatment and Medication management   Observation Level/Precautions:  15 minute checks  Laboratory:  HbAIC, TSH, and potassium level to be ordered.  Labs to be independently reviewed on 06/28/2022  Psychotherapy: Unit group sessions  Medications:  See [facility]  Consultations: To be determined  Discharge Concerns: Safety, medication compliance, mood stability  Estimated LOS: 5 - 7 days  Other: Not applicable    Physician Treatment Plan for Primary Diagnosis: MDD (major depressive disorder), severe ([place]) Long Term Goal(s): Improvement in symptoms so as ready for discharge   Short Term Goals: Ability to identify changes in lifestyle to reduce recurrence of condition will improve, Ability to verbalize feelings will improve, Ability to disclose and discuss suicidal ideas, Ability to demonstrate self-control will improve, Ability to identify and develop effective coping behaviors will improve, Ability to maintain clinical measurements within normal limits will improve, Compliance with prescribed medications will improve, and Ability to identify triggers associated with substance abuse/mental health issues will improve   Physician Treatment Plan for Secondary Diagnosis: Principal Problem:  MDD (major depressive disorder), severe ([place]) Active Problems:  Alcohol abuse   Long Term Goal(s): Improvement in symptoms so as ready for discharge   Short Term Goals: Ability to identify changes in lifestyle to reduce recurrence of condition will improve, Ability to verbalize feelings will improve, Ability to disclose and discuss suicidal ideas, Ability to demonstrate self-control will improve, Ability to identify and develop effective coping behaviors will improve, Ability to  maintain clinical measurements within normal limits will improve, Compliance with prescribed medications will improve, and Ability to identify triggers associated with substance abuse/mental health issues will improve   Plan:   Safety and Monitoring: Voluntary admission to inpatient psychiatric unit for safety, stabilization and treatment Daily contact with patient to assess and evaluate symptoms and progress in treatment Patient's case to be discussed in multi-disciplinary team meeting Observation Level : q15 minute checks Vital signs: q12 hours Precautions: safety   Principal Diagnosis: MDD (major depressive disorder), severe ([place]) Diagnosis:  Principal Problem:  MDD (major depressive disorder), severe ([place]) Active Problems:  Alcohol abuse   #Major depressive disorder, severe -Start Cymbalta 60 mg daily for depression, anxiety, and for mood stability -Start Seroquel 50 mg at bedtime for depression and mood stability   #Alcohol abuse -CIWA protocol initiated -Chlordiazepoxide 25 mg 3 times daily then to be titrated to 1 mg 2 times daily for 1 day the following day depending on CIWA score -Provider encouraged alcohol cessation   #Hypertension -Start patient's home med: Amlodipine 10 mg daily   As needed medications: -Patient to continue taking Tylenol 650 mg every 6 hours as needed for mild pain -Patient to continue taking Maalox/Mylanta 30 mL every 4 hours as needed for indigestion -Patient to continue taking Milk of Magnesia 30 mL as needed for mild constipation -Hydroxyzine 25 mg 3 times  daily as needed for anxiety   Labs to be ordered -Hemoglobin A1c -Lipid panel -Potassium level   Discharge Planning: Social work and case management to assist with discharge planning and identification of hospital follow-up needs prior to discharge Estimated LOS: 5-7 days Discharge Concerns: Need to establish a safety plan; Medication compliance and effectiveness Discharge Goals:  Return home with outpatient referrals for mental health follow-up including medication management/psychotherapy   I certify that inpatient services furnished can reasonably be expected to improve the patient's condition.   Malachy Mood, PA 06/28/2022, 4:45 PM

## 2022-06-28 NOTE — Progress Notes (Signed)
   06/28/22 1138  Psych Admission Type (Psych Patients Only)  Admission Status Involuntary  Psychosocial Assessment  Patient Complaints Depression;Substance abuse  Eye Contact Brief  Facial Expression Anxious  Affect Depressed;Anxious  Speech Logical/coherent  Interaction Minimal  Motor Activity Restless  Appearance/Hygiene Unremarkable  Behavior Characteristics Cooperative;Calm  Mood Depressed  Thought Process  Coherency WDL  Content WDL  Delusions None reported or observed  Perception WDL  Hallucination None reported or observed  Judgment Impaired  Confusion None  Danger to Self  Current suicidal ideation? Denies  Self-Injurious Behavior No self-injurious ideation or behavior indicators observed or expressed   Agreement Not to Harm Self Yes  Description of Agreement verbally contracts for safety  Danger to Others  Danger to Others None reported or observed
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
## 2022-06-28 NOTE — ED Notes (Signed)
[facility] has arrived to transport pt to [facility]

## 2022-06-29 NOTE — Progress Notes (Signed)
Adult Psychoeducational Group Note  Date:  06/29/2022 Time:  11:00 AM  Group Topic/Focus:  Orientation:   The focus of this group is to educate the patient on the purpose and policies of crisis stabilization and provide a format to answer questions about their admission.  The group details unit policies and expectations of patients while admitted.  Pt did not attend orientation group.

## 2022-06-29 NOTE — Plan of Care (Signed)
Nurse discussed anxiety, depression and coping skills with patient.  

## 2022-06-29 NOTE — [facility] Suicide Risk Assessment (Signed)
[place] INPATIENT:  Family/Significant Other Suicide Prevention Education  Suicide Prevention Education:  Education Completed; Alonza Smoker (Friend)   [phone] has been identified by the patient as the family member/significant other with whom the patient will be residing, and identified as the person(s) who will aid the patient in the event of a mental health crisis (suicidal ideations/suicide attempt).  With written consent from the patient, the family member/significant other has been provided the following suicide prevention education, prior to the and/or following the discharge of the patient.  The suicide prevention education provided includes the following: Suicide risk factors Suicide prevention and interventions National Suicide Hotline telephone number [facility] assessment telephone number [facility] [place] and/or Residential Mobile Crisis Unit telephone number  Request made of family/significant other to: Remove weapons (e.g., guns, rifles, knives), all items previously/currently identified as safety concern.   Remove drugs/medications (over-the-counter, prescriptions, illicit drugs), all items previously/currently identified as a safety concern.  Mr. Lovena Neighbours understanding of the suicide prevention education information provided.  The family member/significant other agrees to remove the items of safety concern listed above.  [place] MSW, LCSW 06/29/2022, 1:05 PM
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
## 2022-06-29 NOTE — Progress Notes (Addendum)
D:  Patient's self inventory sheet, patient has poor sleep, no sleep medication.  Poor appetite, low energy level, poor concentration.  Rated depression 8, anxiety 9.  Withdrawals, tremors, chilling.  Denied SI.  Denied physical problems.  Denied physical pain  Goal is discharge.  Plans to do whatever it takes.  No discharge plans. A:  Medications administered per MD orders.  Emotional support and encouragement given patient. R:  Denied SI and Hi, contracts for safety.  Denied A/V hallucinations.  Safety maintained with 15 minute checks.  Patient stated she slept 6 hours last night.  Stated he feels very sleepy this morning.

## 2022-06-29 NOTE — Progress Notes (Signed)
[facility] MD Progress Note  06/29/2022 12:52 PM Charles Graves  MRN:  [id number] Subjective:    Charles Graves is a 41 year old male with a past psychiatric history significant for severe alcohol dependence and major depressive disorder who was admitted under IVC to [facility] from [place] ED for suicidal ideations with a plan to ask [employer] to shoot him in the head.  Patient was assessed by Hilaria Ota for reevaluation. Patient is being managed by the following psychiatric medications:  Cymbalta 60 mg daily Seroquel 50 mg at bedtime Hydroxyzine 25 mg 3 times daily as needed  Patient states that his mood is starting to improve but continues to endorse some depression. Patient denies exiting his room yesterday but states that he did manage to get out of bed some. Patient reports that his gait is slightly unsteady. Patient continues to endorse anxiety and rates his anxiety a 7 out of 10. Patient denies any new stressors at this time. Patient endorses some sweating here and there as well as some tremors.   Patient denies suicidal or homicidal ideations. He further denies auditory or visual hallucinations and does not appear to responding to internal/external stimuli. Patient denies paranoia nor does he endorse delusional thoughts. Patient reports improved sleep and fair appetite. Patient continues to refuse residential treatment for substance abuse.  Principal Problem: MDD (major depressive disorder), severe ([place]) Diagnosis: Principal Problem:   MDD (major depressive disorder), severe ([place]) Active Problems:   Alcohol abuse  Total Time spent with patient: 15 minutes  Past Psychiatric History:  Patient has a past psychiatric diagnosis of major depressive disorder  Patient reports that he has a past psychiatric history significant for manic depression, PTSD, and schizophrenia.  Patient reports that he is unsure when he received his schizophrenia diagnosis.  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 No pertinent surgical history. Family History: History reviewed. No pertinent family history. Family Psychiatric  History:  Mother - patient forts that his mother shot herself in the head, but survived Uncle (deceased) - patient reports that his uncle committed suicide Grandmother (deceased) - patient reports that his mother had mental health but is unsure of her diagnosis  Social History:  Social History   Substance and Sexual Activity  Alcohol Use Yes   Comment: state 1/2 pint/day     Social History   Substance and Sexual Activity  Drug Use Not Currently    Social History   Socioeconomic History   Marital status: Single    Spouse name: Not on file   Number of children: Not on file   Years of education: Not on file   Highest education level: Not on file  Occupational History   Not on file  Tobacco Use   Smoking status: Every Day    Packs/day: 0.50    Types: Cigarettes   Smokeless tobacco: Never  Vaping Use   Vaping Use: Never used  Substance and Sexual Activity   Alcohol use: Yes    Comment: state 1/2 pint/day   Drug use: Not Currently   Sexual activity: Not Currently  Other Topics Concern   Not on file  Social History Narrative   Not on file   Social Determinants of Health   Financial Resource Strain: Not on  file  Food Insecurity: Food Insecurity Present (06/28/2022)   Hunger Vital Sign    Worried About Running Out of Food in the Last Year: Sometimes true    Ran Out of Food in the Last Year: Sometimes true  Transportation Needs: Unmet Transportation Needs (06/28/2022)   PRAPARE - Hydrologist (Medical): Yes    Lack of Transportation (Non-Medical): Yes  Physical Activity: Not on file  Stress: Not on file  Social Connections: Not  on file   Additional Social History:   Sleep: Fair  Appetite:  Fair  Current Medications: Current Facility-Administered Medications  Medication Dose Route Frequency Provider Last Rate Last Admin   acetaminophen (TYLENOL) tablet 650 mg  650 mg Oral Q6H PRN Leevy-Johnson, Brooke A, NP       alum & mag hydroxide-simeth (MAALOX/MYLANTA) 200-200-20 MG/5ML suspension 30 mL  30 mL Oral Q4H PRN Leevy-Johnson, Brooke A, NP       amLODipine (NORVASC) tablet 10 mg  10 mg Oral Daily Kutler Vanvranken E, PA   10 mg at 06/29/22 1610   chlordiazePOXIDE (LIBRIUM) capsule 25 mg  25 mg Oral Q6H PRN Leevy-Johnson, Brooke A, NP   25 mg at 06/28/22 0940   chlordiazePOXIDE (LIBRIUM) capsule 25 mg  25 mg Oral TID Kani Chauvin E, PA   25 mg at 06/29/22 1202   DULoxetine (CYMBALTA) DR capsule 60 mg  60 mg Oral Daily Rondo Spittler E, PA   60 mg at 06/29/22 0805   hydrOXYzine (ATARAX) tablet 25 mg  25 mg Oral Q6H PRN Leevy-Johnson, Brooke A, NP   25 mg at 06/28/22 2159   loperamide (IMODIUM) capsule 2-4 mg  2-4 mg Oral PRN Leevy-Johnson, Brooke A, NP       magnesium hydroxide (MILK OF MAGNESIA) suspension 30 mL  30 mL Oral Daily PRN Leevy-Johnson, Brooke A, NP       multivitamin with minerals tablet 1 tablet  1 tablet Oral Daily Leevy-Johnson, Brooke A, NP   1 tablet at 06/29/22 0806   ondansetron (ZOFRAN-ODT) disintegrating tablet 4 mg  4 mg Oral Q6H PRN Leevy-Johnson, Brooke A, NP   4 mg at 06/28/22 0940   QUEtiapine (SEROQUEL) tablet 50 mg  50 mg Oral QHS Jada Fass E, PA   50 mg at 06/28/22 2159   thiamine (Vitamin B-1) tablet 100 mg  100 mg Oral Daily Leevy-Johnson, Brooke A, NP   100 mg at 06/29/22 9604   thiamine (VITAMIN B1) injection 100 mg  100 mg Intramuscular Once Leevy-Johnson, Brooke A, NP        Lab Results:  Results for orders placed or performed during the hospital encounter of 06/27/22 (from the past 48 hour(s))  Rapid urine drug screen (hospital performed)     Status: Abnormal   Collection  Time: 06/27/22  7:59 PM  Result Value Ref Range   Opiates NONE DETECTED NONE DETECTED   Cocaine NONE DETECTED NONE DETECTED   Benzodiazepines NONE DETECTED NONE DETECTED   Amphetamines NONE DETECTED NONE DETECTED   Tetrahydrocannabinol POSITIVE (A) NONE DETECTED   Barbiturates NONE DETECTED NONE DETECTED    Comment: (NOTE) DRUG SCREEN FOR MEDICAL PURPOSES ONLY.  IF CONFIRMATION IS NEEDED FOR ANY PURPOSE, NOTIFY LAB WITHIN 5 DAYS.  LOWEST DETECTABLE LIMITS FOR URINE DRUG SCREEN Drug Class                     Cutoff (ng/mL) Amphetamine and metabolites    1000 Barbiturate and metabolites  200 Benzodiazepine                 200 Opiates and metabolites        300 Cocaine and metabolites        300 THC                            50 Performed at [facility] [address], [place], [place] [address]     Blood Alcohol level:  Lab Results  Component Value Date   ETH 344 [facility]) 06/27/2022   ETH 209 (H) 19/41/7408    Metabolic Disorder Labs: No results found for: "HGBA1C", "MPG" No results found for: "PROLACTIN" No results found for: "CHOL", "TRIG", "HDL", "CHOLHDL", "VLDL", "LDLCALC"  Physical Findings: AIMS:  , ,  ,  ,    CIWA:  CIWA-Ar Total: 6 COWS:     Musculoskeletal: Strength & Muscle Tone: within normal limits Gait & Station: normal Patient leans: N/A  Psychiatric Specialty Exam:  Presentation  General Appearance:  Appropriate for Environment; Casual  Eye Contact: Fair  Speech: Clear and Coherent  Speech Volume: Normal  Handedness: Right   Mood and Affect  Mood: Depressed; Anxious  Affect: Congruent; Blunt   Thought Process  Thought Processes: Goal Directed; Linear  Descriptions of Associations:Intact  Orientation:Full (Time, Place and Person)  Thought Content:Logical  History of Schizophrenia/Schizoaffective disorder:No  Duration of Psychotic Symptoms:No data recorded Hallucinations:Hallucinations:  None  Ideas of Reference:None  Suicidal Thoughts:Suicidal Thoughts: No  Homicidal Thoughts:Homicidal Thoughts: No   Sensorium  Memory: Immediate Fair; Recent Fair; Remote Fair  Judgment: Fair  Insight: Shallow   Executive Functions  Concentration: Good  Attention Span: Good  Recall: Fair  Fund of Knowledge: Fair  Language: Good   Psychomotor Activity  Psychomotor Activity: Psychomotor Activity: Normal   Assets  Assets: Desire for Improvement; Communication Skills; Resilience; Physical Health   Sleep  Sleep: Sleep: Fair    Physical Exam: Physical Exam Constitutional:      Appearance: Normal appearance.  HENT:     Head: Normocephalic and atraumatic.     Nose: Nose normal.  Eyes:     Extraocular Movements: Extraocular movements intact.     Pupils: Pupils are equal, round, and reactive to light.  Cardiovascular:     Rate and Rhythm: Normal rate and regular rhythm.  Pulmonary:     Effort: Pulmonary effort is normal.     Breath sounds: Normal breath sounds.  Abdominal:     General: Abdomen is flat.  Musculoskeletal:        General: Normal range of motion.     Cervical back: Normal range of motion and neck supple.  Skin:    General: Skin is warm and dry.  Neurological:     General: No focal deficit present.     Mental Status: He is alert and oriented to person, place, and time.  Psychiatric:        Attention and Perception: Attention and perception normal. He does not perceive auditory or visual hallucinations.        Mood and Affect: Mood is anxious and depressed. Affect is blunt.        Speech: Speech normal.        Behavior: Behavior is cooperative.        Thought Content: Thought content normal. Thought content is not paranoid or delusional. Thought content does not include homicidal or suicidal ideation.  Cognition and Memory: Cognition and memory normal.        Judgment: Judgment is inappropriate.    Review of Systems   Psychiatric/Behavioral:  Positive for depression. Negative for hallucinations, substance abuse and suicidal ideas. The patient is nervous/anxious and has insomnia.    Blood pressure (!) 131/92, pulse 76, temperature 98.1 F (36.7 C), temperature source Oral, resp. rate 16, height '5\' 8"'$  (1.727 m), weight 55.3 kg, SpO2 100 %. Body mass index is 18.55 kg/m.   Treatment Plan Summary:  Daily contact with patient to assess and evaluate symptoms and progress in treatment and Medication management  Plan:   Safety and Monitoring: Voluntary admission to inpatient psychiatric unit for safety, stabilization and treatment Daily contact with patient to assess and evaluate symptoms and progress in treatment Patient's case to be discussed in multi-disciplinary team meeting Observation Level : q15 minute checks Vital signs: q12 hours Precautions: suicide, elopement, and assault    Principal Diagnosis: MDD (major depressive disorder), severe ([facility]) Diagnosis:  Principal Problem:   MDD (major depressive disorder), severe ([facility]) Active Problems:   Alcohol abuse   #Major depressive disorder, severe -Continue Cymbalta 60 mg daily for depression, anxiety, and for mood stability -Continue Seroquel 50 mg at bedtime for depression and mood stability   #Alcohol abuse -CIWA protocol continued -Most recent CIWA score (6 at 12:00 pm) -Continue Chlordiazepoxide 25 mg 3 times daily then to be titrated to 1 mg 2 times daily for 1 day the following day depending on CIWA score -Provider encouraged alcohol cessation   #Hypertension -Continue patient's home med: Amlodipine 10 mg daily -Most recent blood pressure (131/92 mmHg, RA)   As needed medications: -Patient to continue taking Tylenol 650 mg every 6 hours as needed for mild pain -Patient to continue taking Maalox/Mylanta 30 mL every 4 hours as needed for indigestion -Patient to continue taking Milk of Magnesia 30 mL as needed for mild  constipation -Continue Hydroxyzine 25 mg 3 times daily as needed for anxiety   Labs to be ordered -Hemoglobin A1c -Lipid panel -Potassium level   Discharge Planning: Social work and case management to assist with discharge planning and identification of hospital follow-up needs prior to discharge Estimated LOS: 5-7 days Discharge Concerns: Need to establish a safety plan; Medication compliance and effectiveness Discharge Goals: Return home with outpatient referrals for mental health follow-up including medication management/psychotherapy    I certify that inpatient services furnished can reasonably be expected to improve the patient's condition.   Malachy Mood, PA 06/29/2022, 12:52 PM

## 2022-06-29 NOTE — Progress Notes (Signed)
Psychoeducational Group Note  Date:  06/29/2022 Time:  2000  Group Topic/Focus:  Wrap up group  Participation Level: Did Not Attend  Participation Quality:  Not Applicable  Affect:  Not Applicable  Cognitive:  Not Applicable  Insight:  Not Applicable  Engagement in Group: Not Applicable  Additional Comments:  Did not attend.   Shellia Cleverly 06/29/2022, 8:47 PM

## 2022-06-29 NOTE — Progress Notes (Signed)
   06/29/22 0545  Sleep  Number of Hours 6

## 2022-06-30 LAB — POTASSIUM: Potassium: 3.6 mmol/L (ref 3.5–5.1)

## 2022-06-30 LAB — LIPID PANEL
Cholesterol: 236 mg/dL — ABNORMAL HIGH (ref 0–200)
HDL: 53 mg/dL (ref >40)
LDL Cholesterol: 161 mg/dL — ABNORMAL HIGH (ref 0–99)
Total CHOL/HDL Ratio: 4.5 RATIO
Triglycerides: 110 mg/dL (ref <150)
VLDL: 22 mg/dL (ref 0–40)

## 2022-06-30 LAB — HEMOGLOBIN A1C
Hgb A1c MFr Bld: 5.3 % (ref 4.8–5.6)
Mean Plasma Glucose: 105.41 mg/dL

## 2022-06-30 MED ORDER — CHLORDIAZEPOXIDE HCL 25 MG PO CAPS
25.0000 mg | ORAL_CAPSULE | Freq: Two times a day (BID) | ORAL | Status: DC
  Administered 2022-06-30 – 2022-07-02 (×4): 25 mg via ORAL
  Filled 2022-06-30 (×4): qty 1

## 2022-06-30 MED ORDER — QUETIAPINE FUMARATE 100 MG PO TABS
100.0000 mg | ORAL_TABLET | Freq: Every day | ORAL | Status: DC
  Administered 2022-06-30 – 2022-07-02 (×3): 100 mg via ORAL
  Filled 2022-06-30 (×4): qty 1

## 2022-06-30 MED ORDER — TRAZODONE HCL 50 MG PO TABS
50.0000 mg | ORAL_TABLET | Freq: Every evening | ORAL | Status: DC | PRN
  Administered 2022-06-30 – 2022-07-02 (×3): 50 mg via ORAL
  Filled 2022-06-30 (×3): qty 1

## 2022-06-30 MED ORDER — POTASSIUM CHLORIDE 20 MEQ PO PACK
40.0000 meq | PACK | Freq: Once | ORAL | Status: AC
  Administered 2022-06-30: 40 meq via ORAL
  Filled 2022-06-30: qty 2

## 2022-06-30 MED ORDER — MAGNESIUM OXIDE -MG SUPPLEMENT 400 (240 MG) MG PO TABS
400.0000 mg | ORAL_TABLET | Freq: Every day | ORAL | Status: DC
  Administered 2022-06-30 – 2022-07-03 (×4): 400 mg via ORAL
  Filled 2022-06-30 (×5): qty 1

## 2022-06-30 MED ORDER — LISINOPRIL 5 MG PO TABS
5.0000 mg | ORAL_TABLET | Freq: Every day | ORAL | Status: DC
  Administered 2022-06-30 – 2022-07-01 (×2): 5 mg via ORAL
  Filled 2022-06-30 (×4): qty 1

## 2022-06-30 NOTE — Progress Notes (Signed)
D:  Patient denied SI this morning, contracts for safety.  Denied HI.   Denied A/V hallucinations.  Denied pain. A::  Medications administered per MD orders.  Emotional support and encouragement given patient. R:  Safety maintained with 15 minute checks. Patient stated he slept about two hours last night.  Was worried about his problems.

## 2022-06-30 NOTE — Progress Notes (Signed)
D- Patient alert and oriented. Denies SI, HI, AVH. Reports a "mild" headache. Patient rates anxiety as a 7/10 and depression 5/10. Patient spent his evening in his room and in bed. Patient stated that he feels "confused." This RN assessed patient's orientation and found the patient to be oriented and capable of answering sequential questioning.   A- Scheduled medications administered to patient, per MD orders. Support and encouragement provided.  Routine safety checks conducted every 15 minutes.  Patient informed to notify staff with problems or concerns.  R- No adverse drug reactions noted. Patient contracts for safety at this time. Patient compliant with medications and treatment plan. Patient receptive, calm, and cooperative. Patient remains safe at this time.

## 2022-06-30 NOTE — Progress Notes (Signed)
   06/30/22 2000  Psych Admission Type (Psych Patients Only)  Admission Status Involuntary  Psychosocial Assessment  Patient Complaints Anxiety  Eye Contact Brief  Facial Expression Anxious  Affect Depressed  Speech Logical/coherent  Interaction Minimal  Motor Activity Restless  Appearance/Hygiene Unremarkable  Behavior Characteristics Anxious  Mood Depressed  Aggressive Behavior  Effect No apparent injury  Thought Process  Coherency WDL  Content WDL  Delusions WDL  Perception WDL  Hallucination None reported or observed  Judgment WDL  Confusion None  Danger to Self  Current suicidal ideation? Denies  Danger to Others  Danger to Others None reported or observed

## 2022-06-30 NOTE — Plan of Care (Signed)
Nurse discussed anxiety, depression and coping skills with patient.  

## 2022-06-30 NOTE — Group Note (Signed)
[facility] LCSW Group Therapy Note   Group Date: 06/30/2022 Start Time: 1300 End Time: 1400   Type of Therapy and Topic: Group Therapy: Avoiding Self-Sabotaging and Enabling Behaviors  Participation Level: Did Not Attend  Mood:  Description of Group:  In this group, patients will learn how to identify obstacles, self-sabotaging and enabling behaviors, as well as: what are they, why do we do them and what needs these behaviors meet. Discuss unhealthy relationships and how to have positive healthy boundaries with those that sabotage and enable. Explore aspects of self-sabotage and enabling in yourself and how to limit these self-destructive behaviors in everyday life.   Therapeutic Goals: 1. Patient will identify one obstacle that relates to self-sabotage and enabling behaviors 2. Patient will identify one personal self-sabotaging or enabling behavior they did prior to admission 3. Patient will state a plan to change the above identified behavior 4. Patient will demonstrate ability to communicate their needs through discussion and/or role play.    Summary of Patient Progress:   Therapeutic Modalities:  Cognitive Behavioral Therapy Person-Centered Therapy Motivational Interviewing    Paislea Hatton S Sharonlee Nine, LCSW

## 2022-06-30 NOTE — Group Note (Unsigned)
Date:  06/30/2022 Time:  2:34 PM  Group Topic/Focus:  Orientation:   The focus of this group is to educate the patient on the purpose and policies of crisis stabilization and provide a format to answer questions about their admission.  The group details unit policies and expectations of patients while admitted.     Participation Level:  {BHH PARTICIPATION DPOEU:23536}  Participation Quality:  {BHH PARTICIPATION QUALITY:22265}  Affect:  {BHH AFFECT:22266}  Cognitive:  {BHH COGNITIVE:22267}  Insight: {BHH Insight2:20797}  Engagement in Group:  {BHH ENGAGEMENT IN RWERX:54008}  Modes of Intervention:  {BHH MODES OF INTERVENTION:22269}  Additional Comments:  ***  Jerrye Beavers 06/30/2022, 2:34 PM

## 2022-06-30 NOTE — Progress Notes (Signed)
   06/30/22 0551  Sleep  Number of Hours 6

## 2022-06-30 NOTE — Progress Notes (Signed)
[facility] MD Progress Note  06/30/2022 2:27 PM Charles Graves  MRN:  [id number] Subjective:    Charles Graves is a 41 year old male with a past psychiatric history significant for severe alcohol dependence and major depressive disorder who was admitted under IVC to [facility] from [place] [facility] for suicidal ideations with a plan to ask [employer] to shoot him in the head.   Patient was assessed by Charles Graves for reevaluation. Patient is being managed by the following psychiatric medications:   Cymbalta 60 mg daily Seroquel 50 mg at bedtime Hydroxyzine 25 mg 3 times daily as needed  Patient reports that he is exhausted from not having enough sleep. Patient states that he is averaging 2 - 3 hours of sleep at a time. Patient believes that his medications have been helpful and that Seroquel normally improves his sleep.  Patient still continues to endorse some depression as well as anxiety he rates at 7 out of 10.  Patient endorses the following withdrawal related symptoms: Sweating and slight headache.  Patient continues to refuse residential treatment for alcohol abuse stating that he has to be able to work.  Patient denies suicidal or homicidal ideations.  He further denies auditory or visual hallucinations and does not appear to be responding to internal/external stimuli.  Patient denies paranoia or delusional thoughts.  He endorses poor sleep.  States that his appetite is improving.  Patient's most recent blood pressure was 131/103 mmHg.  Patient's latest CIWA score was 6.  Patient's most recent potassium level was 3.6 mmol/L.  Provider reached out to Dr. Tennis Graves (internal medicine) regarding patient's elevated blood pressure.  Per Dr. Valentino Graves recommendations, patient to be placed on lisinopril 5 mg daily for the management of his blood pressure.  He recommended giving patient magnesium oxide 400 mg daily due to history of excessive alcohol consumption.  He also recommended patient  be given 40 mEq of potassium to increase potassium level.  Patient to have lab drawn for BMP and magnesium level 07/02/2022.  Principal Problem: MDD (major depressive disorder), severe ([place]) Diagnosis: Principal Problem:   MDD (major depressive disorder), severe ([place]) Active Problems:   Alcohol abuse  Total Time spent with patient: 15 minutes  Past Psychiatric History:  Patient has a past psychiatric diagnosis of major depressive disorder   Patient reports that he has a past psychiatric history significant for manic depression, PTSD, and schizophrenia.  Patient reports that he is unsure when he received his schizophrenia diagnosis.  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 No pertinent surgical history. Family History: History reviewed. No pertinent family history. Family Psychiatric  History:  Mother - patient forts that his mother shot herself in the head, but survived Uncle (deceased) - patient reports that his uncle committed suicide Grandmother (deceased) - patient reports that his mother had mental health but is unsure of her diagnosis  Social History:  Social History   Substance and Sexual Activity  Alcohol Use Yes   Comment: state 1/2 pint/day     Social History   Substance and Sexual Activity  Drug Use Not Currently    Social History   Socioeconomic History   Marital status: Single    Spouse name: Not on file   Number of children:  Not on file   Years of education: Not on file   Highest education level: Not on file  Occupational History   Not on file  Tobacco Use   Smoking status: Every Day    Packs/day: 0.50    Types: Cigarettes   Smokeless tobacco: Never  Vaping Use   Vaping Use: Never used  Substance and Sexual Activity   Alcohol use: Yes    Comment: state 1/2 pint/day    Drug use: Not Currently   Sexual activity: Not Currently  Other Topics Concern   Not on file  Social History Narrative   Not on file   Social Determinants of Health   Financial Resource Strain: Not on file  Food Insecurity: Food Insecurity Present (06/28/2022)   Hunger Vital Sign    Worried About Running Out of Food in the Last Year: Sometimes true    Ran Out of Food in the Last Year: Sometimes true  Transportation Needs: Unmet Transportation Needs (06/28/2022)   PRAPARE - Hydrologist (Medical): Yes    Lack of Transportation (Non-Medical): Yes  Physical Activity: Not on file  Stress: Not on file  Social Connections: Not on file   Additional Social History:   Sleep: Poor  Appetite:  Fair  Current Medications: Current Facility-Administered Medications  Medication Dose Route Frequency Provider Last Rate Last Admin   acetaminophen (TYLENOL) tablet 650 mg  650 mg Oral Q6H PRN Leevy-Johnson, Brooke A, NP       alum & mag hydroxide-simeth (MAALOX/MYLANTA) 200-200-20 MG/5ML suspension 30 mL  30 mL Oral Q4H PRN Leevy-Johnson, Brooke A, NP       amLODipine (NORVASC) tablet 10 mg  10 mg Oral Daily Qiara Minetti E, PA   10 mg at 06/30/22 0805   chlordiazePOXIDE (LIBRIUM) capsule 25 mg  25 mg Oral Q6H PRN Leevy-Johnson, Brooke A, NP   25 mg at 06/28/22 0940   chlordiazePOXIDE (LIBRIUM) capsule 25 mg  25 mg Oral BID Jeven Topper E, PA       DULoxetine (CYMBALTA) DR capsule 60 mg  60 mg Oral Daily Lorean Ekstrand E, PA   60 mg at 06/30/22 0805   hydrOXYzine (ATARAX) tablet 25 mg  25 mg Oral Q6H PRN Leevy-Johnson, Brooke A, NP   25 mg at 06/30/22 0813   lisinopril (ZESTRIL) tablet 5 mg  5 mg Oral Daily Eason Housman E, PA       loperamide (IMODIUM) capsule 2-4 mg  2-4 mg Oral PRN Leevy-Johnson, Brooke A, NP       magnesium hydroxide (MILK OF MAGNESIA) suspension 30 mL  30 mL Oral Daily PRN Leevy-Johnson, Brooke A, NP       magnesium oxide (MAG-OX) tablet  400 mg  400 mg Oral Daily Rai Sinagra E, PA       multivitamin with minerals tablet 1 tablet  1 tablet Oral Daily Leevy-Johnson, Brooke A, NP   1 tablet at 06/30/22 0805   ondansetron (ZOFRAN-ODT) disintegrating tablet 4 mg  4 mg Oral Q6H PRN Leevy-Johnson, Brooke A, NP   4 mg at 06/28/22 0940   potassium chloride (KLOR-CON) packet 40 mEq  40 mEq Oral Once Elesia Pemberton E, PA       QUEtiapine (SEROQUEL) tablet 100 mg  100 mg Oral QHS Prisma Decarlo E, PA       thiamine (Vitamin B-1) tablet 100 mg  100 mg Oral Daily Leevy-Johnson, Brooke A, NP   100 mg at 06/30/22 [phone]  thiamine (VITAMIN B1) injection 100 mg  100 mg Intramuscular Once Leevy-Johnson, Brooke A, NP       traZODone (DESYREL) tablet 50 mg  50 mg Oral QHS PRN Chey Rachels, Terese Door, PA        Lab Results:  Results for orders placed or performed during the hospital encounter of 06/28/22 (from the past 48 hour(s))  Hemoglobin A1c     Status: None   Collection Time: 06/30/22  6:32 AM  Result Value Ref Range   Hgb A1c MFr Bld 5.3 4.8 - 5.6 %    Comment: (NOTE) Pre diabetes:          5.7%-6.4%  Diabetes:              >6.4%  Glycemic control for   <7.0% adults with diabetes    Mean Plasma Glucose 105.41 mg/dL    Comment: Performed at [facility], [place] [address] [place], [place] [address]  Lipid panel     Status: Abnormal   Collection Time: 06/30/22  6:32 AM  Result Value Ref Range   Cholesterol 236 (H) 0 - 200 mg/dL   Triglycerides 110 <150 mg/dL   HDL 53 >40 mg/dL   Total CHOL/HDL Ratio 4.5 RATIO   VLDL 22 0 - 40 mg/dL   LDL Cholesterol 161 (H) 0 - 99 mg/dL    Comment:        Total Cholesterol/HDL:CHD Risk Coronary Heart Disease Risk Table                     Men   Women  1/2 Average Risk   3.4   3.3  Average Risk       5.0   4.4  2 X Average Risk   9.6   7.1  3 X Average Risk  23.4   11.0        Use the calculated Patient Ratio above and the CHD Risk Table to determine the patient's CHD Risk.        ATP  III CLASSIFICATION (LDL):  <100     mg/dL   Optimal  100-129  mg/dL   Near or Above                    Optimal  130-159  mg/dL   Borderline  160-189  mg/dL   High  >190     mg/dL   Very High Performed at [facility] [address] [place], [place] [address]   Potassium     Status: None   Collection Time: 06/30/22  6:32 AM  Result Value Ref Range   Potassium 3.6 3.5 - 5.1 mmol/L    Comment: Performed at [facility], [place] [address] [place], [place] [address]    Blood Alcohol level:  Lab Results  Component Value Date   ETH 344 [facility]) 06/27/2022   ETH 209 (H) [id number]/[id number]    Metabolic Disorder Labs: Lab Results  Component Value Date   HGBA1C 5.3 06/30/2022   MPG 105.41 06/30/2022   No results found for: "PROLACTIN" Lab Results  Component Value Date   CHOL 236 (H) 06/30/2022   TRIG 110 06/30/2022   HDL 53 06/30/2022   CHOLHDL 4.5 06/30/2022   VLDL 22 06/30/2022   LDLCALC 161 (H) 06/30/2022    Physical Findings: AIMS:  , ,  ,  ,    CIWA:  CIWA-Ar Total: 6 COWS:     Musculoskeletal: Strength &  Muscle Tone: within normal limits Gait & Station: normal Patient leans: N/A  Psychiatric Specialty Exam:  Presentation  General Appearance:  Appropriate for Environment; Casual  Eye Contact: Fair  Speech: Clear and Coherent  Speech Volume: Normal  Handedness: Right   Mood and Affect  Mood: Depressed; Anxious  Affect: Congruent; Blunt   Thought Process  Thought Processes: Goal Directed; Linear  Descriptions of Associations:Intact  Orientation:Full (Time, Place and Person)  Thought Content:Logical  History of Schizophrenia/Schizoaffective disorder:No  Duration of Psychotic Symptoms:No data recorded Hallucinations:Hallucinations: None  Ideas of Reference:None  Suicidal Thoughts:Suicidal Thoughts: No  Homicidal Thoughts:Homicidal Thoughts: No   Sensorium  Memory: Immediate Fair; Recent Fair; Remote  Fair  Judgment: Fair  Insight: Shallow   Executive Functions  Concentration: Good  Attention Span: Good  Recall: Fair  Fund of Knowledge: Fair  Language: Good   Psychomotor Activity  Psychomotor Activity: Psychomotor Activity: Normal   Assets  Assets: Desire for Improvement; Communication Skills; Resilience; Physical Health   Sleep  Sleep: Sleep: Fair    Physical Exam: Physical Exam Constitutional:      Appearance: Normal appearance.  HENT:     Head: Normocephalic and atraumatic.     Graves: Graves normal.     Mouth/Throat:     Mouth: Mucous membranes are moist.  Eyes:     Extraocular Movements: Extraocular movements intact.     Pupils: Pupils are equal, round, and reactive to light.  Cardiovascular:     Pulses: Normal pulses.     Heart sounds: Normal heart sounds.  Pulmonary:     Effort: Pulmonary effort is normal.     Breath sounds: Normal breath sounds.  Abdominal:     General: Abdomen is flat.  Musculoskeletal:        General: Normal range of motion.     Cervical back: Normal range of motion and neck supple.  Skin:    General: Skin is warm and dry.  Neurological:     General: No focal deficit present.     Mental Status: He is oriented to person, place, and time.  Psychiatric:        Attention and Perception: Attention and perception normal. He does not perceive auditory or visual hallucinations.        Mood and Affect: Affect normal. Mood is anxious and depressed.        Speech: Speech normal.        Behavior: Behavior normal. Behavior is cooperative.        Thought Content: Thought content normal. Thought content is not paranoid or delusional. Thought content does not include homicidal or suicidal ideation.        Cognition and Memory: Cognition and memory normal.        Judgment: Judgment normal.    Review of Systems  Psychiatric/Behavioral:  Positive for depression and substance abuse. Negative for hallucinations and suicidal ideas.  The patient is nervous/anxious and has insomnia.    Blood pressure (!) 131/103, pulse 93, temperature 98.1 F (36.7 C), temperature source Oral, resp. rate 16, height '5\' 8"'$  (1.727 m), weight 55.3 kg, SpO2 100 %. Body mass index is 18.55 kg/m.   Treatment Plan Summary: Daily contact with patient to assess and evaluate symptoms and progress in treatment and Medication management  Plan:   Safety and Monitoring: Voluntary admission to inpatient psychiatric unit for safety, stabilization and treatment Daily contact with patient to assess and evaluate symptoms and progress in treatment Patient's case to be discussed in multi-disciplinary team  meeting Observation Level : q15 minute checks Vital signs: q12 hours Precautions: suicide, elopement, and assault    Principal Diagnosis: MDD (major depressive disorder), severe ([place]) Diagnosis:  Principal Problem:   MDD (major depressive disorder), severe ([place]) Active Problems:   Alcohol abuse   #Major depressive disorder, severe -Continue Cymbalta 60 mg daily for depression, anxiety, and for mood stability -Increase Seroquel to 100 mg at bedtime for depression, mood stability, and for sleep   #Alcohol abuse -CIWA protocol continued -Most recent CIWA score (6 at 10:00 pm 06/29/2022) -Continue Chlordiazepoxide 25 mg 2 times daily then to be titrated to 25 mg 1 time daily for 1 day the following day depending on CIWA score -Provider encouraged alcohol cessation -Provider encouraged for patient to consider a residential treatment program but patient refused   #Hypertension -Continue patient's home med: Amlodipine 10 mg daily -Provider was able to reach out to Dr. Tennis Graves for recommendations regarding the management of patient's blood pressure.  Per Dr. Valentino Graves recommendations: -Start lisinopril 5 mg daily -Start magnesium oxide 400 mg daily -Give 40 mEq of potassium chloride to maintain patient's potassium level -BMP and magnesium  level to be ordered on 07/02/2022 -Monitor patient for angioedema or cough when taking lisinopril -Most recent blood pressure (131/103 mmHg)   As needed medications: -Patient to continue taking Tylenol 650 mg every 6 hours as needed for mild pain -Patient to continue taking Maalox/Mylanta 30 mL every 4 hours as needed for indigestion -Patient to continue taking Milk of Magnesia 30 mL as needed for mild constipation -Continue Hydroxyzine 25 mg 3 times daily as needed for anxiety   Labs to be ordered on 07/02/2022 -BMP -Magnesium level  Patient's hemoglobin A1c was 5.3%.  Patient's potassium level was 3.6 mmol/L.  Patient's fasting lipid panel was significant for related cholesterol (236 mg/dL) and elevated LDL (161 mg/dL).  Provider to recommend patient attend group sessions   Discharge Planning: Social work and case management to assist with discharge planning and identification of hospital follow-up needs prior to discharge Estimated LOS: 5-7 days Discharge Concerns: Need to establish a safety plan; Medication compliance and effectiveness Discharge Goals: Return home with outpatient referrals for mental health follow-up including medication management/psychotherapy    I certify that inpatient services furnished can reasonably be expected to improve the patient's condition.   Malachy Mood, PA 06/30/2022, 2:27 PM

## 2022-06-30 NOTE — Group Note (Signed)
Date:  06/30/2022 Time:  2:35 PM  Group Topic/Focus:  Orientation:   The focus of this group is to educate the patient on the purpose and policies of crisis stabilization and provide a format to answer questions about their admission.  The group details unit policies and expectations of patients while admitted.    Participation Level:  Did Not Attend  Participation Quality:      Affect:      Cognitive:      Insight: None  Engagement in Group:      Modes of Intervention:      Additional Comments:     Jerrye Beavers 06/30/2022, 2:35 PM

## 2022-06-30 NOTE — Plan of Care (Signed)
  Problem: Health Behavior/Discharge Planning: Goal: Compliance with treatment plan for underlying cause of condition will improve Outcome: Progressing   Problem: Coping: Goal: Will verbalize feelings Outcome: Progressing   Problem: Education: Goal: Emotional status will improve Outcome: Not Progressing Goal: Mental status will improve Outcome: Not Progressing   Problem: Activity: Goal: Interest or engagement in activities will improve Outcome: Not Progressing   Problem: Activity: Goal: Interest or engagement in leisure activities will improve Outcome: Not Progressing

## 2022-06-30 NOTE — Group Note (Addendum)
[facility] LCSW Group Therapy Note    Group Date: 06/30/2022 Start Time: 1300 End Time: 1400  Type of Therapy and Topic:  Group Therapy:  Overcoming Obstacles  Participation Level:  [facility] PARTICIPATION LEVEL: Did Not Attend  Mood:   Description of Group:   In this group patients will be encouraged to explore what they see as obstacles to their own wellness and recovery. They will be guided to discuss their thoughts, feelings, and behaviors related to these obstacles. The group will process together ways to cope with barriers, with attention given to specific choices patients can make. Each patient will be challenged to identify changes they are motivated to make in order to overcome their obstacles. This group will be process-oriented, with patients participating in exploration of their own experiences as well as giving and receiving support and challenge from other group members.  Therapeutic Goals: 1. Patient will identify personal and current obstacles as they relate to admission. 2. Patient will identify barriers that currently interfere with their wellness or overcoming obstacles.  3. Patient will identify feelings, thought process and behaviors related to these barriers. 4. Patient will identify two changes they are willing to make to overcome these obstacles:    Summary of Patient Progress Patient did not attend group despite encouraged participation.    Therapeutic Modalities:   Cognitive Behavioral Therapy Solution Focused Therapy Motivational Interviewing Relapse Prevention Therapy   Durenda Hurt, [place]

## 2022-07-01 NOTE — Progress Notes (Signed)
Pt is A&OX4, anxious, pleasant, cooperative, denies suicidal ideations, denies homicidal ideations, denies auditory hallucinations and denies visual hallucinations. Pt verbally agrees to approach staff if these become apparent and before harming self or others. Pt denies experiencing nightmares. Mood and affect are congruent. Pt appetite is ok. No complaints of distress, pain and/or discomfort at this time. Pt's memory appears to be grossly intact, and Pt hasn't displayed any injurious behaviors. Pt is medication compliant. There's no evidence of suicidal intent. Psychomotor activity was WNL. No s/s of Parkinson, Dystonia, Akathisia and/or Tardive Dyskinesia noted.

## 2022-07-01 NOTE — Group Note (Signed)
[facility] LCSW Group Therapy Note  07/01/2022  10:00-11:00AM  Type of Therapy and Topic:  Group Therapy:  Grief   Participation Level:  Did Not Attend   Description of Group:  Patients in this group asked to talk about grief during this session.  As the topic was introduced, we talked about grief involving death much of the time, but also that it is the emotion we may experience at the loss of a relationship, job, pet, etc.  Patients were introduced to the well-known [employer] of 5 stages of grief (Denial, Anger, Bargaining, Depression, Acceptance) and the fact that these stages are often not linear and people may go through one or more stages numerous times, although with greater ease each subsequent time.  This led to a discussion about patients' sources of grief and what their experiences have been in going through this process.  Therapeutic Goals: 1)  learn about the 5 stages of grief 2)  share patients' direct experiences in going through these stages  3)  give an opportunity to each patient to say out loud what they would like to say to a person they are currently grieving  4) discuss actions that could possibly help in the grief process such as writing a letter, journaling, listening to music, exercising, saying names out loud, and more   Summary of Patient Progress:  Patient was invited to group, did not attend.   Therapeutic Modalities:   Motivational Interviewing Processing  Maretta Los

## 2022-07-01 NOTE — Progress Notes (Signed)
   07/01/22 0530  Sleep  Number of Hours 10

## 2022-07-01 NOTE — Progress Notes (Signed)
[facility] MD Progress Note  07/01/2022 1:26 PM Charles Graves  MRN:  [id number]  Subjective: Charles Graves states, " I feel good, because of the treatment therapy I guess."  Brief assessment: Charles Graves is a 41 year old male with a past psychiatric history significant for severe alcohol dependence and major depressive disorder who was admitted under IVC to [facility] for suicidal ideations with a plan to ask [employer] to shoot him in the head.   Patient was examined by Garrison Columbus, NP for reevaluation. Patient is being managed by the following psychiatric medications:   Cymbalta 60 mg daily Seroquel 50 mg at bedtime Hydroxyzine 25 mg 3 times daily as needed  Today's assessment: Patient is seen and examined on [place] on his bed.  He reports he is feeling better and slept for over 9 hours last night, he believes that Seroquel normally improves his sleep. He reports reduced anxiety however, rates as 8/10 on a scale of 0-10 with 10 being the worst.  Reports his mood is less depressed and rated as 7/10, on a scale of 0-10 with 10 being the worst.  Reports he has good appetite and drinking enough fluid for hydration.  Patient denies alcohol withdrawal symptoms during this assessment. Patient denies any side effects from his prescribed medications. CIWA score of 5 noted for the shift.  He continues to refuse rehabilitation treatment however, endorses that he prefers IOP treatment.    Patient denies suicidal or homicidal ideations. He further denies auditory or visual hallucinations and does not appear to be responding to internal/external stimuli.  Patient denies paranoia or delusional thoughts.    Patient's most recent blood pressure was 101/87 mmHg, P 126. Patient's most recent potassium level was 3.6 mmol/L, potassium chloride replacement effective.  Continues on magnesium oxide 400 mg daily due to history of excessive alcohol consumption.  We will follow-up with BMP  and magnesium level to be drawn on 07/02/2022.  Principal Problem: MDD (major depressive disorder), severe ([place]) Diagnosis: Principal Problem:   MDD (major depressive disorder), severe ([place]) Active Problems:   Alcohol abuse  Total Time spent with patient: 15 minutes  Past Psychiatric History:  Patient has a past psychiatric diagnosis of major depressive disorder   Patient reports that he has a past psychiatric history significant for manic depression, PTSD, and schizophrenia.  Patient reports that he is unsure when he received his schizophrenia diagnosis.  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 No pertinent surgical history. Family History: History reviewed. No pertinent family history. Family Psychiatric  History:  Mother - patient forts that his mother shot herself in the head, but survived Uncle (deceased) - patient reports that his uncle committed suicide Grandmother (deceased) - patient reports that his mother had mental health but is unsure of her diagnosis  Social History:  Social History   Substance and Sexual Activity  Alcohol Use Yes   Comment: state 1/2 pint/day     Social History   Substance and Sexual Activity  Drug Use Not Currently    Social History   Socioeconomic History   Marital status: Single    Spouse name: Not on file   Number of children: Not on file   Years of education: Not on  file   Highest education level: Not on file  Occupational History   Not on file  Tobacco Use   Smoking status: Every Day    Packs/day: 0.50    Types: Cigarettes   Smokeless tobacco: Never  Vaping Use   Vaping Use: Never used  Substance and Sexual Activity   Alcohol use: Yes    Comment: state 1/2 pint/day   Drug use: Not Currently   Sexual activity: Not Currently  Other Topics  Concern   Not on file  Social History Narrative   Not on file   Social Determinants of Health   Financial Resource Strain: Not on file  Food Insecurity: Food Insecurity Present (06/28/2022)   Hunger Vital Sign    Worried About Running Out of Food in the Last Year: Sometimes true    Ran Out of Food in the Last Year: Sometimes true  Transportation Needs: Unmet Transportation Needs (06/28/2022)   PRAPARE - Hydrologist (Medical): Yes    Lack of Transportation (Non-Medical): Yes  Physical Activity: Not on file  Stress: Not on file  Social Connections: Not on file   Additional Social History:   Sleep: Poor  Appetite:  Fair  Current Medications: Current Facility-Administered Medications  Medication Dose Route Frequency Provider Last Rate Last Admin   acetaminophen (TYLENOL) tablet 650 mg  650 mg Oral Q6H PRN Leevy-Johnson, Brooke A, NP       alum & mag hydroxide-simeth (MAALOX/MYLANTA) 200-200-20 MG/5ML suspension 30 mL  30 mL Oral Q4H PRN Leevy-Johnson, Brooke A, NP       amLODipine (NORVASC) tablet 10 mg  10 mg Oral Daily Nwoko, Uchenna E, PA   10 mg at 07/01/22 0753   chlordiazePOXIDE (LIBRIUM) capsule 25 mg  25 mg Oral BID Nwoko, Uchenna E, PA   25 mg at 07/01/22 0754   DULoxetine (CYMBALTA) DR capsule 60 mg  60 mg Oral Daily Nwoko, Uchenna E, PA   60 mg at 07/01/22 0754   lisinopril (ZESTRIL) tablet 5 mg  5 mg Oral Daily Nwoko, Uchenna E, PA   5 mg at 07/01/22 0754   magnesium hydroxide (MILK OF MAGNESIA) suspension 30 mL  30 mL Oral Daily PRN Leevy-Johnson, Brooke A, NP       magnesium oxide (MAG-OX) tablet 400 mg  400 mg Oral Daily Nwoko, Uchenna E, PA   400 mg at 07/01/22 0754   multivitamin with minerals tablet 1 tablet  1 tablet Oral Daily Leevy-Johnson, Brooke A, NP   1 tablet at 07/01/22 0755   QUEtiapine (SEROQUEL) tablet 100 mg  100 mg Oral QHS Nwoko, Uchenna E, PA   100 mg at 06/30/22 2141   thiamine (Vitamin B-1) tablet 100 mg  100 mg Oral  Daily Leevy-Johnson, Brooke A, NP   100 mg at 07/01/22 0755   thiamine (VITAMIN B1) injection 100 mg  100 mg Intramuscular Once Leevy-Johnson, Brooke A, NP       traZODone (DESYREL) tablet 50 mg  50 mg Oral QHS PRN Nwoko, Uchenna E, PA   50 mg at 06/30/22 2141    Lab Results:  Results for orders placed or performed during the hospital encounter of 06/28/22 (from the past 48 hour(s))  Hemoglobin A1c     Status: None   Collection Time: 06/30/22  6:32 AM  Result Value Ref Range   Hgb A1c MFr Bld 5.3 4.8 - 5.6 %    Comment: (NOTE) Pre diabetes:  5.7%-6.4%  Diabetes:              >6.4%  Glycemic control for   <7.0% adults with diabetes    Mean Plasma Glucose 105.41 mg/dL    Comment: Performed at [facility] [address] [place], [place] [address]  Lipid panel     Status: Abnormal   Collection Time: 06/30/22  6:32 AM  Result Value Ref Range   Cholesterol 236 (H) 0 - 200 mg/dL   Triglycerides 110 <150 mg/dL   HDL 53 >40 mg/dL   Total CHOL/HDL Ratio 4.5 RATIO   VLDL 22 0 - 40 mg/dL   LDL Cholesterol 161 (H) 0 - 99 mg/dL    Comment:        Total Cholesterol/HDL:CHD Risk Coronary Heart Disease Risk Table                     Men   Women  1/2 Average Risk   3.4   3.3  Average Risk       5.0   4.4  2 X Average Risk   9.6   7.1  3 X Average Risk  23.4   11.0        Use the calculated Patient Ratio above and the CHD Risk Table to determine the patient's CHD Risk.        ATP III CLASSIFICATION (LDL):  <100     mg/dL   Optimal  100-129  mg/dL   Near or Above                    Optimal  130-159  mg/dL   Borderline  160-189  mg/dL   High  >190     mg/dL   Very High Performed at [facility] [address] [place], [place] [address]   Potassium     Status: None   Collection Time: 06/30/22  6:32 AM  Result Value Ref Range   Potassium 3.6 3.5 - 5.1 mmol/L    Comment: Performed at [facility], [place] [address]  [place], [place] [address]    Blood Alcohol level:  Lab Results  Component Value Date   ETH 344 [facility]) 06/27/2022   ETH 209 (H) 76/73/4193    Metabolic Disorder Labs: Lab Results  Component Value Date   HGBA1C 5.3 06/30/2022   MPG 105.41 06/30/2022   No results found for: "PROLACTIN" Lab Results  Component Value Date   CHOL 236 (H) 06/30/2022   TRIG 110 06/30/2022   HDL 53 06/30/2022   CHOLHDL 4.5 06/30/2022   VLDL 22 06/30/2022   LDLCALC 161 (H) 06/30/2022    Physical Findings: AIMS:  , ,  ,  ,    CIWA:  CIWA-Ar Total: 5 COWS:     Musculoskeletal: Strength & Muscle Tone: within normal limits Gait & Station: normal Patient leans: N/A  Psychiatric Specialty Exam:  Presentation  General Appearance:  Appropriate for Environment; Casual  Eye Contact: Fair  Speech: Clear and Coherent  Speech Volume: Normal  Handedness: Right  Mood and Affect  Mood: Anxious; Depressed  Affect: Congruent; Blunt   Thought Process  Thought Processes: Goal Directed; Coherent  Descriptions of Associations:Intact  Orientation:Full (Time, Place and Person)  Thought Content:Logical  History of Schizophrenia/Schizoaffective disorder:No  Duration of Psychotic Symptoms:No data recorded Hallucinations:Hallucinations: None  Ideas of Reference:None  Suicidal Thoughts:Suicidal Thoughts: No  Homicidal Thoughts:Homicidal Thoughts: No  Sensorium  Memory: Immediate Fair; Remote  Fair; Recent Fair  Judgment: Fair  Insight: Shallow  Executive Functions  Concentration: Good  Attention Span: Good  Recall: [employer] of Knowledge: Fair  Language: Good  Psychomotor Activity  Psychomotor Activity: Psychomotor Activity: Normal  Assets  Assets: Communication Skills; Desire for Improvement; Physical Health  Sleep  Sleep: Sleep: Good Number of Hours of Sleep: 9    Physical Exam: Physical Exam Vitals and nursing note reviewed.  Constitutional:       Appearance: Normal appearance.  HENT:     Head: Normocephalic and atraumatic.     Right Ear: External ear normal.     Left Ear: External ear normal.     Nose: Nose normal.     Mouth/Throat:     Mouth: Mucous membranes are moist.  Eyes:     Extraocular Movements: Extraocular movements intact.     Conjunctiva/sclera: Conjunctivae normal.     Pupils: Pupils are equal, round, and reactive to light.  Cardiovascular:     Rate and Rhythm: Tachycardia present.     Pulses: Normal pulses.     Heart sounds: Normal heart sounds.     Comments: Blood pressure 101/87, pulse 126.  Nursing staff to recheck vital signs. Pulmonary:     Effort: Pulmonary effort is normal.     Breath sounds: Normal breath sounds.  Abdominal:     General: Abdomen is flat.  Genitourinary:    Comments: Deferred Musculoskeletal:        General: Normal range of motion.     Cervical back: Normal range of motion and neck supple.  Skin:    General: Skin is warm and dry.  Neurological:     General: No focal deficit present.     Mental Status: He is oriented to person, place, and time.  Psychiatric:        Attention and Perception: Attention and perception normal. He does not perceive auditory or visual hallucinations.        Mood and Affect: Affect normal. Mood is anxious and depressed.        Speech: Speech normal.        Behavior: Behavior normal. Behavior is cooperative.        Thought Content: Thought content normal. Thought content is not paranoid or delusional. Thought content does not include homicidal or suicidal ideation.        Cognition and Memory: Cognition and memory normal.        Judgment: Judgment normal.    Review of Systems  Constitutional: Negative.  Negative for chills and fever.  HENT: Negative.  Negative for hearing loss and tinnitus.   Eyes: Negative.  Negative for blurred vision and double vision.  Respiratory: Negative.  Negative for cough and hemoptysis.   Cardiovascular: Negative.   Negative for chest pain and palpitations.       Blood pressure 101/87, pulse 126.  Nursing staff to recheck vital signs.  Gastrointestinal: Negative.  Negative for heartburn and nausea.  Genitourinary: Negative.  Negative for dysuria and urgency.  Musculoskeletal: Negative.  Negative for myalgias and neck pain.  Skin: Negative.  Negative for itching and rash.  Neurological: Negative.  Negative for dizziness, tingling and headaches.  Endo/Heme/Allergies: Negative.  Negative for environmental allergies and polydipsia. Does not bruise/bleed easily.  Psychiatric/Behavioral:  Positive for depression and substance abuse. Negative for hallucinations and suicidal ideas. The patient is nervous/anxious and has insomnia.    Blood pressure 101/87, pulse (!) 126, temperature 97.6 F (36.4 C), temperature source Oral, resp.  rate 16, height '5\' 8"'$  (1.727 m), weight 55.3 kg, SpO2 100 %. Body mass index is 18.55 kg/m.   Treatment Plan Summary: Daily contact with patient to assess and evaluate symptoms and progress in treatment and Medication management  Plan:   Safety and Monitoring: Voluntary admission to inpatient psychiatric unit for safety, stabilization and treatment Daily contact with patient to assess and evaluate symptoms and progress in treatment Patient's case to be discussed in multi-disciplinary team meeting Observation Level : q15 minute checks Vital signs: q12 hours Precautions: suicide, elopement, and assault    Principal Diagnosis: MDD (major depressive disorder), severe ([place]) Diagnosis:  Principal Problem:   MDD (major depressive disorder), severe ([place]) Active Problems:   Alcohol abuse   #Major depressive disorder, severe -Continue Cymbalta 60 mg daily for depression, anxiety, and for mood stability -Increase Seroquel to 100 mg at bedtime for depression, mood stability, and for sleep   #Alcohol abuse -CIWA protocol continued -Most recent CIWA score (6 at 10:00 pm  06/29/2022) -Continue Chlordiazepoxide 25 mg 2 times daily then to be titrated to 25 mg 1 time daily for 1 day the following day depending on CIWA score -Provider encouraged alcohol cessation -Provider encouraged for patient to consider a residential treatment program but patient refused   #Hypertension -Continue patient's home med: Amlodipine 10 mg daily -Provider was able to reach out to Dr. Tennis Must for recommendations regarding the management of patient's blood pressure.  Per Dr. Valentino Nose recommendations: -Start lisinopril 5 mg daily -Start magnesium oxide 400 mg daily -Give 40 mEq of potassium chloride to maintain patient's potassium level -BMP and magnesium level to be ordered on 07/02/2022 -Monitor patient for angioedema or cough when taking lisinopril -Most recent blood pressure (131/103 mmHg)   As needed medications: -Patient to continue taking Tylenol 650 mg every 6 hours as needed for mild pain -Patient to continue taking Maalox/Mylanta 30 mL every 4 hours as needed for indigestion -Patient to continue taking Milk of Magnesia 30 mL as needed for mild constipation -Continue Hydroxyzine 25 mg 3 times daily as needed for anxiety   Labs to be ordered on 07/02/2022 -BMP -Magnesium level  Patient's hemoglobin A1c was 5.3%.  Patient's potassium level was 3.6 mmol/L.  Patient's fasting lipid panel was significant for related cholesterol (236 mg/dL) and elevated LDL (161 mg/dL).  Provider to recommend patient attend group sessions   Discharge Planning: Social work and case management to assist with discharge planning and identification of hospital follow-up needs prior to discharge Estimated LOS: 5-7 days Discharge Concerns: Need to establish a safety plan; Medication compliance and effectiveness Discharge Goals: Return home with outpatient referrals for mental health follow-up including medication management/psychotherapy    I certify that inpatient services furnished  can reasonably be expected to improve the patient's condition.   Laretta Bolster, FNP 07/01/2022, 1:26 PMPatient ID: Charles Graves, male   DOB: May 16, 1981, 41 y.o.   MRN: [id number]

## 2022-07-01 NOTE — BHH Group Notes (Signed)
Pt did not attend group. 

## 2022-07-01 NOTE — [facility] Group Notes (Signed)
Adult Goals Group Note  Date:  07/01/2022 Time:  11:55 AM  Group Topic/Focus:  Goals Group:   The focus of this group is to help patients establish daily goals to achieve during treatment and discuss how the patient can incorporate goal setting into their daily lives to aide in recovery.  Participation Level:  Did Not Attend

## 2022-07-02 DIAGNOSIS — F322 Major depressive disorder, single episode, severe without psychotic features: Secondary | ICD-10-CM

## 2022-07-02 LAB — BASIC METABOLIC PANEL
Anion gap: 7 (ref 5–15)
BUN: 19 mg/dL (ref 6–20)
CO2: 25 mmol/L (ref 22–32)
Calcium: 9 mg/dL (ref 8.9–10.3)
Chloride: 107 mmol/L (ref 98–111)
Creatinine, Ser: 0.73 mg/dL (ref 0.61–1.24)
GFR, Estimated: >60 mL/min (ref >60)
Glucose, Bld: 97 mg/dL (ref 70–99)
Potassium: 3.8 mmol/L (ref 3.5–5.1)
Sodium: 139 mmol/L (ref 135–145)

## 2022-07-02 LAB — MAGNESIUM: Magnesium: 2.4 mg/dL (ref 1.7–2.4)

## 2022-07-02 NOTE — BHH Group Notes (Signed)
Pt did not attend group. 

## 2022-07-02 NOTE — Progress Notes (Signed)
Adult Psychoeducational Group Note  Date:  07/02/2022 Time:  8:53 PM  Group Topic/Focus:  Wrap-Up Group:   The focus of this group is to help patients review their daily goal of treatment and discuss progress on daily workbooks.  Participation Level:  Active  Participation Quality:  Appropriate, Attentive, and Sharing  Affect:  Appropriate  Cognitive:  Alert and Appropriate  Insight: Appropriate  Engagement in Group:  Engaged  Modes of Intervention:  Discussion and Support  Additional Comments:   Pt attended and actively engaged in the [name] group. Pt denied SI/HI/AVH. Pt day 8/10, looking forward to discharge tomorrow 07/03/22. Pt reports progress toward medication mgmt. " Finally got the right medication for my hypertension and depression. Pt reports getting extra sleep as an effective coping skill.  Charles Graves 07/02/2022, 8:53 PM

## 2022-07-02 NOTE — Group Note (Signed)
LCSW Group Therapy Note  07/02/2022      Type of Therapy and Topic:  Group Therapy: Gratitude  Participation Level:  Did Not Attend   Description of Group:   In this group, patients shared and discussed the importance of acknowledging the elements in their lives for which they are grateful and how this can positively impact their mood.  The group discussed how bringing the positive elements of their lives to the forefront of their minds can help with recovery from any illness, physical or mental.  An exercise was done as a group in which a list was made of gratitude items in order to encourage participants to consider other potential positives in their lives.  Therapeutic Goals: Patients will identify one or more item for which they are grateful in each of 6 categories:  people, experiences, things, places, skills, and other. Patients will discuss how it is possible to seek out gratitude in even bad situations. Patients will explore other possible items of gratitude that they could remember.   Summary of Patient Progress:  Patient was invited to group, did not attend.   Therapeutic Modalities:   Solution-Focused Therapy Activity  [place]lin Hun Grossman-Orr, LCSW .

## 2022-07-02 NOTE — Progress Notes (Addendum)
[facility] MD Progress Note  07/02/2022 10:04 AM Charles Graves  MRN:  [id number]  Subjective: Charles Graves states, " I have greatly improved compared to when I came in initially.  I feel like myself again."  Brief assessment: Charles Graves is a 41 year old male with a past psychiatric history significant for severe alcohol dependence and major depressive disorder who was admitted under IVC to [facility] from [place] [facility] for suicidal ideations with a plan to ask [employer] to shoot him in the head.   Patient was examined by Garrison Columbus, NP for reevaluation. Patient is being managed by the following psychiatric medications:   Cymbalta 60 mg daily Seroquel 50 mg at bedtime Hydroxyzine 25 mg 3 times daily as needed  Today's assessment: On examination today, patient is seen and examined on [address] sitting up on his bed.  He reports he is feeling better and slept for over 5 hours last night.  Patient continues on Seroquel and hydroxyzine as ordered.  He reports reduced anxiety however, rates as 5/10 on a scale of 0-10 with 10 being the worst.  Reports his mood is less depressed and rated as 3/10, on a scale of 0-10 with 10 being the worst.  This is great improvement for patient compared to yesterday.  Reports he has good appetite and drinking enough fluid for hydration.  Patient denies alcohol withdrawal symptoms during this assessment. CIWA score of 0 noted for the shift. Patient denies any side effects from his prescribed medications.  He continues to refuse rehabilitation treatment however, endorses that he prefers IOP treatment upon discharge.    Patient denies suicidal or homicidal ideations. He further denies auditory or visual hallucinations and does not appear to be responding to internal/external stimuli.  Patient denies paranoia or delusional thoughts.    Patient's most recent blood pressure was 88/69 mmHg, P 98.  As per nursing report, Norvasc 10 mg p.o. daily was held due to low blood  pressure reading.  Patient's most recent potassium level was 3.8 mmol/L, potassium chloride replacement effective.  Continues on magnesium oxide 400 mg daily due to history of excessive alcohol consumption.  BMP drawn this morning with magnesium level of 2.4 normal, and potassium level of 3.8 normal. Plan: Possible discharge tomorrow 07/03/22.             Principal Problem: MDD (major depressive disorder), severe ([place]) Diagnosis: Principal Problem:   MDD (major depressive disorder), severe ([place]) Active Problems:   Alcohol abuse  Total Time spent with patient: 15 minutes  Past Psychiatric History:  Patient has a past psychiatric diagnosis of major depressive disorder   Patient reports that he has a past psychiatric history significant for manic depression, PTSD, and schizophrenia.  Patient reports that he is unsure when he received his schizophrenia diagnosis.  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 No pertinent surgical history. Family History: History reviewed. No pertinent family history. Family Psychiatric  History:  Mother - patient forts that his mother shot herself in the head, but survived Uncle (deceased) - patient reports that his uncle committed suicide Grandmother (deceased) - patient reports that his mother had mental health but is unsure of her diagnosis  Social History:  Social History   Substance and Sexual Activity  Alcohol Use Yes   Comment: state 1/2 pint/day     Social History   Substance and Sexual Activity  Drug Use Not Currently    Social History   Socioeconomic History   Marital status: Single    Spouse name: Not on file   Number of children: Not on file   Years of education: Not on file   Highest education level: Not on file  Occupational History   Not on file   Tobacco Use   Smoking status: Every Day    Packs/day: 0.50    Types: Cigarettes   Smokeless tobacco: Never  Vaping Use   Vaping Use: Never used  Substance and Sexual Activity   Alcohol use: Yes    Comment: state 1/2 pint/day   Drug use: Not Currently   Sexual activity: Not Currently  Other Topics Concern   Not on file  Social History Narrative   Not on file   Social Determinants of Health   Financial Resource Strain: Not on file  Food Insecurity: Food Insecurity Present (06/28/2022)   Hunger Vital Sign    Worried About Running Out of Food in the Last Year: Sometimes true    Ran Out of Food in the Last Year: Sometimes true  Transportation Needs: Unmet Transportation Needs (06/28/2022)   PRAPARE - Hydrologist (Medical): Yes    Lack of Transportation (Non-Medical): Yes  Physical Activity: Not on file  Stress: Not on file  Social Connections: Not on file   Additional Social History:   Sleep: Poor  Appetite:  Fair  Current Medications: Current Facility-Administered Medications  Medication Dose Route Frequency Provider Last Rate Last Admin   acetaminophen (TYLENOL) tablet 650 mg  650 mg Oral Q6H PRN Leevy-Johnson, Brooke A, NP       alum & mag hydroxide-simeth (MAALOX/MYLANTA) 200-200-20 MG/5ML suspension 30 mL  30 mL Oral Q4H PRN Leevy-Johnson, Brooke A, NP       amLODipine (NORVASC) tablet 10 mg  10 mg Oral Daily Nwoko, Uchenna E, PA   10 mg at 07/01/22 0753   DULoxetine (CYMBALTA) DR capsule 60 mg  60 mg Oral Daily Nwoko, Uchenna E, PA   60 mg at 07/02/22 0741   magnesium hydroxide (MILK OF MAGNESIA) suspension 30 mL  30 mL Oral Daily PRN Leevy-Johnson, Brooke A, NP       magnesium oxide (MAG-OX) tablet 400 mg  400 mg Oral Daily Nwoko, Uchenna E, PA   400 mg at 07/02/22 0741   multivitamin with minerals tablet 1 tablet  1 tablet Oral Daily Leevy-Johnson, Brooke A, NP   1 tablet at 07/02/22 0741   QUEtiapine (SEROQUEL) tablet 100 mg  100 mg  Oral QHS Nwoko, Uchenna E, PA   100 mg at 07/01/22 2130   thiamine (Vitamin B-1) tablet 100 mg  100 mg Oral Daily Leevy-Johnson, Brooke A, NP   100 mg at 07/02/22 0741   thiamine (VITAMIN B1) injection 100 mg  100 mg Intramuscular Once Leevy-Johnson, Brooke A, NP       traZODone (DESYREL) tablet 50 mg  50 mg Oral QHS PRN Nwoko, Uchenna E, PA   50 mg at 07/01/22 2130    Lab Results:  Results for orders placed or performed during the hospital encounter of 06/28/22 (from the past 48 hour(s))  Magnesium     Status: None   Collection Time: 07/02/22  6:45 AM  Result Value Ref Range   Magnesium 2.4 1.7 - 2.4 mg/dL  Comment: Performed at [facility], [place] [address], [place], [place] [address]  Basic metabolic panel     Status: None   Collection Time: 07/02/22  6:45 AM  Result Value Ref Range   Sodium 139 135 - 145 mmol/L   Potassium 3.8 3.5 - 5.1 mmol/L   Chloride 107 98 - 111 mmol/L   CO2 25 22 - 32 mmol/L   Glucose, Bld 97 70 - 99 mg/dL    Comment: Glucose reference range applies only to samples taken after fasting for at least 8 hours.   BUN 19 6 - 20 mg/dL   Creatinine, Ser 0.73 0.61 - 1.24 mg/dL   Calcium 9.0 8.9 - 10.3 mg/dL   GFR, Estimated >60 >60 mL/min    Comment: (NOTE) Calculated using the CKD-EPI Creatinine Equation (2021)    Anion gap 7 5 - 15    Comment: Performed at [facility], [place] [address] [place], [place] [address]    Blood Alcohol level:  Lab Results  Component Value Date   ETH 344 [facility]) 06/27/2022   ETH 209 (H) 58/52/7782    Metabolic Disorder Labs: Lab Results  Component Value Date   HGBA1C 5.3 06/30/2022   MPG 105.41 06/30/2022   No results found for: "PROLACTIN" Lab Results  Component Value Date   CHOL 236 (H) 06/30/2022   TRIG 110 06/30/2022   HDL 53 06/30/2022   CHOLHDL 4.5 06/30/2022   VLDL 22 06/30/2022   LDLCALC 161 (H) 06/30/2022    Physical Findings: AIMS:  , ,  ,  ,    CIWA:  CIWA-Ar Total:  0 COWS:     Musculoskeletal: Strength & Muscle Tone: within normal limits Gait & Station: normal Patient leans: N/A  Psychiatric Specialty Exam:  Presentation  General Appearance:  Appropriate for Environment; Casual; Fairly Groomed  Eye Contact: Good  Speech: Clear and Coherent  Speech Volume: Normal  Handedness: Right  Mood and Affect  Mood: Anxious; Depressed  Affect: Congruent  Thought Process  Thought Processes: Coherent; Goal Directed  Descriptions of Associations:Intact  Orientation:Full (Time, Place and Person)  Thought Content:Logical  History of Schizophrenia/Schizoaffective disorder:No  Duration of Psychotic Symptoms:No data recorded Hallucinations:Hallucinations: None  Ideas of Reference:None  Suicidal Thoughts:Suicidal Thoughts: No  Homicidal Thoughts:Homicidal Thoughts: No  Sensorium  Memory: Immediate Good; Recent Good; Remote Good  Judgment: Fair  Insight: Present  Executive Functions  Concentration: Good  Attention Span: Good  Recall: [place] of Knowledge: Fair  Language: Good  Psychomotor Activity  Psychomotor Activity: Psychomotor Activity: Normal  Assets  Assets: Communication Skills; Desire for Improvement; Physical Health  Sleep  Sleep: Sleep: Fair Number of Hours of Sleep: 5  Physical Exam: Physical Exam Vitals and nursing note reviewed.  Constitutional:      Appearance: Normal appearance.  HENT:     Head: Normocephalic and atraumatic.     Right Ear: External ear normal.     Left Ear: External ear normal.     Nose: Nose normal.     Mouth/Throat:     Mouth: Mucous membranes are moist.  Eyes:     Extraocular Movements: Extraocular movements intact.     Conjunctiva/sclera: Conjunctivae normal.     Pupils: Pupils are equal, round, and reactive to light.  Cardiovascular:     Rate and Rhythm: Tachycardia present.     Pulses: Normal pulses.     Heart sounds: Normal heart sounds.      Comments: Blood pressure 88/69, pulse  98.  Nursing staff to recheck vital signs. Pulmonary:     Effort: Pulmonary effort is normal.     Breath sounds: Normal breath sounds.  Abdominal:     General: Abdomen is flat.  Genitourinary:    Comments: Deferred Musculoskeletal:        General: Normal range of motion.     Cervical back: Normal range of motion and neck supple.  Skin:    General: Skin is warm and dry.  Neurological:     General: No focal deficit present.     Mental Status: He is oriented to person, place, and time.  Psychiatric:        Attention and Perception: Attention and perception normal. He does not perceive auditory or visual hallucinations.        Mood and Affect: Affect normal. Mood is anxious and depressed.        Speech: Speech normal.        Behavior: Behavior normal. Behavior is cooperative.        Thought Content: Thought content normal. Thought content is not paranoid or delusional. Thought content does not include homicidal or suicidal ideation.        Cognition and Memory: Cognition and memory normal.        Judgment: Judgment normal.    Review of Systems  Constitutional: Negative.  Negative for chills and fever.  HENT: Negative.  Negative for hearing loss and tinnitus.   Eyes: Negative.  Negative for blurred vision and double vision.  Respiratory: Negative.  Negative for cough and hemoptysis.   Cardiovascular: Negative.  Negative for chest pain and palpitations.       Blood pressure 88/69, pulse 98.  Nursing staff to recheck vital signs.   Gastrointestinal: Negative.  Negative for heartburn and nausea.  Genitourinary: Negative.  Negative for dysuria and urgency.  Musculoskeletal: Negative.  Negative for myalgias and neck pain.  Skin: Negative.  Negative for itching and rash.  Neurological: Negative.  Negative for dizziness, tingling and headaches.  Endo/Heme/Allergies: Negative.  Negative for environmental allergies and polydipsia. Does not bruise/bleed  easily.  Psychiatric/Behavioral:  Positive for depression and substance abuse. Negative for hallucinations and suicidal ideas. The patient is nervous/anxious and has insomnia.    Blood pressure (!) 88/69, pulse 98, temperature 97.6 F (36.4 C), temperature source Oral, resp. rate 18, height '5\' 8"'$  (1.727 m), weight 55.3 kg, SpO2 99 %. Body mass index is 18.55 kg/m.   Treatment Plan Summary: Daily contact with patient to assess and evaluate symptoms and progress in treatment and Medication management  Plan:   Safety and Monitoring: Voluntary admission to inpatient psychiatric unit for safety, stabilization and treatment Daily contact with patient to assess and evaluate symptoms and progress in treatment Patient's case to be discussed in multi-disciplinary team meeting Observation Level : q15 minute checks Vital signs: q12 hours Precautions: suicide, elopement, and assault    Principal Diagnosis: MDD (major depressive disorder), severe ([place]) Diagnosis:  Principal Problem:   MDD (major depressive disorder), severe ([place]) Active Problems:   Alcohol abuse   #Major depressive disorder, severe -Continue Cymbalta 60 mg daily for depression, anxiety, and for mood stability -Increase Seroquel to 100 mg at bedtime for depression, mood stability, and for sleep   #Alcohol abuse -CIWA protocol continued -Most recent CIWA score (6 at 10:00 pm 06/29/2022) -Continue Chlordiazepoxide 25 mg 2 times daily then to be titrated to 25 mg 1 time daily for 1 day the following day depending on CIWA score -Provider  encouraged alcohol cessation -Provider encouraged for patient to consider a residential treatment program but patient refused   #Hypertension -Continue patient's home med: Amlodipine 10 mg daily -Provider was able to reach out to Dr. Tennis Must for recommendations regarding the management of patient's blood pressure.  Per Dr. Valentino Nose recommendations: -Start lisinopril 5 mg daily -Start  magnesium oxide 400 mg daily -Give 40 mEq of potassium chloride to maintain patient's potassium level -BMP and magnesium level to be ordered on 07/02/2022 -Monitor patient for angioedema or cough when taking lisinopril -Most recent blood pressure (131/103 mmHg)   As needed medications: -Patient to continue taking Tylenol 650 mg every 6 hours as needed for mild pain -Patient to continue taking Maalox/Mylanta 30 mL every 4 hours as needed for indigestion -Patient to continue taking Milk of Magnesia 30 mL as needed for mild constipation -Continue Hydroxyzine 25 mg 3 times daily as needed for anxiety   Labs to be ordered on 07/02/2022 -BMP -Magnesium level  Patient's hemoglobin A1c was 5.3%.  Patient's potassium level was 3.6 mmol/L.  Patient's fasting lipid panel was significant for related cholesterol (236 mg/dL) and elevated LDL (161 mg/dL).  Provider to recommend patient attend group sessions   Discharge Planning: Social work and case management to assist with discharge planning and identification of hospital follow-up needs prior to discharge Estimated LOS: 5-7 days Discharge Concerns: Need to establish a safety plan; Medication compliance and effectiveness Discharge Goals: Return home with outpatient referrals for mental health follow-up including medication management/psychotherapy    I certify that inpatient services furnished can reasonably be expected to improve the patient's condition.   Laretta Bolster, FNP 07/02/2022, 10:04 AMPatient ID: Charles Graves, male   DOB: 1981-07-25, 41 y.o.   MRN: [id number] Patient ID: Laker Thompson, male   DOB: 02-02-1981, 41 y.o.   MRN: [id number]

## 2022-07-02 NOTE — Progress Notes (Signed)
Pt presents with depressed mood, affect congruent. Charles Graves has been isolative to his room the entire shift. He has not left except for meals, and has not attended any programming, despite encouragement.  He has also not completed self inventory , and on multiple rounds is seen in bed resting.  This am he reported that he was doing '' better '' and reported '' withdrawal symptoms are improved. '' Pt denied any SI or HI or A/V Hallucinations. NO acute concerns voiced and pt able to make his needs known. Pt is safe, will con't to monitor.

## 2022-07-02 NOTE — Plan of Care (Signed)
  Problem: Education: Goal: Knowledge of [facility] Education information/materials will improve Outcome: Progressing Goal: Emotional status will improve Outcome: Progressing Goal: Mental status will improve Outcome: Progressing Goal: Verbalization of understanding the information provided will improve Outcome: Progressing   Problem: Activity: Goal: Interest or engagement in activities will improve Outcome: Progressing Goal: Sleeping patterns will improve Outcome: Progressing   Problem: Coping: Goal: Ability to verbalize frustrations and anger appropriately will improve Outcome: Progressing Goal: Ability to demonstrate self-control will improve Outcome: Progressing   

## 2022-07-03 MED ORDER — AMLODIPINE BESYLATE 10 MG PO TABS: 10.0000 mg | ORAL_TABLET | Freq: Every day | ORAL | 0 refills | Status: DC

## 2022-07-03 MED ORDER — TRAZODONE HCL 50 MG PO TABS: 50.0000 mg | ORAL_TABLET | Freq: Every evening | ORAL | 0 refills | Status: DC | PRN

## 2022-07-03 MED ORDER — DULOXETINE HCL 60 MG PO CPEP: 60.0000 mg | ORAL_CAPSULE | Freq: Every day | ORAL | 0 refills | Status: DC

## 2022-07-03 MED ORDER — QUETIAPINE FUMARATE 100 MG PO TABS: 100.0000 mg | ORAL_TABLET | Freq: Every day | ORAL | 0 refills | Status: DC

## 2022-07-03 NOTE — [facility] Suicide Risk Assessment (Signed)
[place] INPATIENT:  Family/Significant Other Suicide Prevention Education  Suicide Prevention Education:  Education Completed; Alonza Smoker (Friend)   [phone] has been identified by the patient as the family member/significant other with whom the patient will be residing, and identified as the person(s) who will aid the patient in the event of a mental health crisis (suicidal ideations/suicide attempt).  With written consent from the patient, the family member/significant other has been provided the following suicide prevention education, prior to the and/or following the discharge of the patient.  The suicide prevention education provided includes the following: Suicide risk factors Suicide prevention and interventions National Suicide Hotline telephone number [facility] assessment telephone number [facility] Emergency Assistance [place] and/or Residential Mobile Crisis Unit telephone number  Request made of family/significant other to: Remove weapons (e.g., guns, rifles, knives), all items previously/currently identified as safety concern.   Remove drugs/medications (over-the-counter, prescriptions, illicit drugs), all items previously/currently identified as a safety concern.  The family member/significant other verbalizes understanding of the suicide prevention education information provided.  The family member/significant other agrees to remove the items of safety concern listed above.  Kayly Kriegel S Reyanne Hussar 07/03/2022, 9:39 AM

## 2022-07-03 NOTE — [facility] Suicide Risk Assessment (Signed)
Suicide Risk Assessment  Discharge Assessment    [facility] Discharge Suicide Risk Assessment   Principal Problem: MDD (major depressive disorder), severe ([place]) Discharge Diagnoses: Principal Problem:   MDD (major depressive disorder), severe ([place]) Active Problems:   Alcohol abuse   HPI:  Charles Graves is a 41 year old male with a past psychiatric history significant for severe alcohol dependence and major depressive disorder who was admitted under IVC to [facility] from [place] ED for suicidal ideations with a plan.   HOSPITAL COURSE:  During the patient's hospitalization, patient had extensive initial psychiatric evaluation, and follow-up psychiatric evaluations every day.  Psychiatric diagnoses provided upon initial assessment: Major depressive disorder, severe and Alcohol abuse  Patient's psychiatric medications were adjusted on admission:  -Start Cymbalta 60 mg daily -Start Seroquel 50 mg at bedtime -Start Chlordiazepoxide 25 mg 3 times daily  During the hospitalization, other adjustments were made to the patient's psychiatric medication regimen:  -Continued Cymbalta 60 mg daily -Increased Seroquel to 100 mg at bedtime -Discontinued Chlordiazepoxide  Patient's care was discussed during the interdisciplinary team meeting every day during the hospitalization.  The patient denies having side effects to prescribed psychiatric medication.  Gradually, patient started adjusting to milieu. The patient was evaluated each day by a clinical provider to ascertain response to treatment. Improvement was noted by the patient's report of decreasing symptoms, improved sleep and appetite, affect, medication tolerance, behavior, and participation in unit programming.  Patient was asked each day to complete a self inventory noting mood, mental status, pain, new symptoms, anxiety and concerns.    Symptoms were reported as significantly decreased or resolved completely by  discharge.   On day of discharge, the patient reports that their mood is stable. The patient denied having suicidal thoughts for more than 48 hours prior to discharge.  Patient denies having homicidal thoughts.  Patient denies having auditory hallucinations.  Patient denies any visual hallucinations or other symptoms of psychosis. The patient was motivated to continue taking medication with a goal of continued improvement in mental health.   The patient reports their target psychiatric symptoms of major depressive disorder and alcohol abuse responded well to the psychiatric medications, and the patient reports overall benefit other psychiatric hospitalization. Supportive psychotherapy was provided to the patient. The patient also participated in regular group therapy while hospitalized. Coping skills, problem solving as well as relaxation therapies were also part of the unit programming.  Labs were reviewed with the patient, and abnormal results were discussed with the patient.  The patient is able to verbalize their individual safety plan to this provider.  # It is recommended to the patient to continue psychiatric medications as prescribed, after discharge from the hospital.    # It is recommended to the patient to follow up with your outpatient psychiatric provider and PCP.  # It was discussed with the patient, the impact of alcohol, drugs, tobacco have been there overall psychiatric and medical wellbeing, and total abstinence from substance use was recommended the patient.ed.  # Prescriptions provided or sent directly to preferred pharmacy at discharge. Patient agreeable to plan. Given opportunity to ask questions. Appears to feel comfortable with discharge.    # In the event of worsening symptoms, the patient is instructed to call the crisis hotline, 911 and or go to the nearest ED for appropriate evaluation and treatment of symptoms. To follow-up with primary care provider for other medical  issues, concerns and or health care needs  # Patient was discharged home  with a plan to follow up as noted below.    Total Time spent with patient: 15 minutes  Musculoskeletal: Strength & Muscle Tone: within normal limits Gait & Station: normal Patient leans: N/A  Psychiatric Specialty Exam  Presentation  General Appearance:  Appropriate for Environment; Casual; Fairly Groomed  Eye Contact: Good  Speech: Clear and Coherent; Normal Rate  Speech Volume: Normal  Handedness: Right   Mood and Affect  Mood: Euthymic  Duration of Depression Symptoms: Greater than two weeks  Affect: Appropriate   Thought Process  Thought Processes: Coherent; Goal Directed  Descriptions of Associations:Intact  Orientation:Full (Time, Place and Person)  Thought Content:Logical  History of Schizophrenia/Schizoaffective disorder:No  Duration of Psychotic Symptoms:No data recorded Hallucinations:Hallucinations: None  Ideas of Reference:None  Suicidal Thoughts:Suicidal Thoughts: No  Homicidal Thoughts:Homicidal Thoughts: No   Sensorium  Memory: Immediate Good; Recent Good; Remote Good  Judgment: Fair  Insight: Present   Executive Functions  Concentration: Good  Attention Span: Good  Recall: Fair  Fund of Knowledge: Fair  Language: Good   Psychomotor Activity  Psychomotor Activity: Psychomotor Activity: Normal   Assets  Assets: Communication Skills; Desire for Improvement; Physical Health   Sleep  Sleep: Sleep: Good Number of Hours of Sleep: 5   Physical Exam: Physical Exam Constitutional:      Appearance: Normal appearance.  HENT:     Head: Normocephalic and atraumatic.     Nose: Nose normal.     Mouth/Throat:     Mouth: Mucous membranes are moist.  Eyes:     Extraocular Movements: Extraocular movements intact.     Pupils: Pupils are equal, round, and reactive to light.  Cardiovascular:     Rate and Rhythm: Normal rate and  regular rhythm.  Pulmonary:     Effort: Pulmonary effort is normal.     Breath sounds: Normal breath sounds.  Abdominal:     General: Abdomen is flat.  Musculoskeletal:        General: Normal range of motion.     Cervical back: Normal range of motion and neck supple.  Skin:    General: Skin is warm and dry.  Neurological:     General: No focal deficit present.     Mental Status: He is alert and oriented to person, place, and time.  Psychiatric:        Attention and Perception: Attention and perception normal. He does not perceive auditory or visual hallucinations.        Mood and Affect: Mood and affect normal. Mood is not anxious or depressed.        Speech: Speech normal.        Behavior: Behavior normal. Behavior is cooperative.        Thought Content: Thought content normal. Thought content is not paranoid or delusional. Thought content does not include homicidal or suicidal ideation.        Cognition and Memory: Cognition and memory normal.        Judgment: Judgment normal.    Review of Systems  Constitutional: Negative.   HENT: Negative.    Eyes: Negative.   Respiratory: Negative.    Cardiovascular: Negative.   Gastrointestinal: Negative.   Skin: Negative.   Neurological: Negative.   Psychiatric/Behavioral:  Positive for substance abuse. Negative for depression, hallucinations and suicidal ideas. The patient is not nervous/anxious and does not have insomnia.    Blood pressure 91/77, pulse (!) 106, temperature 98.3 F (36.8 C), temperature source Oral, resp. rate 18, height '5\' 8"'$  (  1.727 m), weight 55.3 kg, SpO2 99 %. Body mass index is 18.55 kg/m.  Mental Status Per Nursing Assessment::   On Admission:  Suicidal ideation indicated by patient  Nursing information obtained from:  Patient Demographic factors:  Male, Caucasian, Gay, lesbian, or bisexual orientation, Low socioeconomic status Loss Factors:  Financial problems / change in socioeconomic status Historical  Factors:  NA Risk Reduction Factors:  Living with another person, especially a relative  Suicide Risk:  Mild:  Suicidal ideation of limited frequency, intensity, duration, and specificity.  There are no identifiable plans, no associated intent, mild dysphoria and related symptoms, good self-control (both objective and subjective assessment), few other risk factors, and identifiable protective factors, including available and accessible social support.   Follow-up Information     [facility] Follow up on 07/05/2022.   Specialty: Behavioral Health Why: You are scheduled for an assessment to attend CDIOP on 07/05/2022 at 1:00pm.  We are located in the [place] on the 3rd floor and our building is directly across the street from [facility].  If you have questions or need to cancel please call [phone] Contact information: [facility] Follow up.   Specialty: Behavioral Health Why: Will be scheduled for therapy and medication management after participating in the [facility] program.  Please speak with Mikel Cella to begin this process. Contact information: [place] [phone]                Plan Of Care/Follow-up recommendations:   Activity: As tolerated  Diet: Heart healthy  Other: -Follow-up with your outpatient psychiatric provider -instructions on appointment date, time, and address (location) are provided to you in discharge paperwork.   -Take your psychiatric medications as prescribed at discharge - instructions are provided to you in the discharge paperwork.    -Follow-up with outpatient primary care doctor and other specialists -for management of preventative medicine and chronic medical disease, including: hypertension    -Testing: Follow-up with outpatient provider for abnormal lab results:  none   -Recommend abstinence from alcohol, tobacco, and other illicit drug use at discharge.    -If your psychiatric symptoms recur, worsen, or if you have side effects to your psychiatric medications, call your outpatient psychiatric provider, 911, [phone] or go to the nearest emergency department.   -If suicidal thoughts recur, call your outpatient psychiatric provider, 911, [phone] or go to the nearest emergency department.   Malachy Mood, PA 07/03/2022, 9:53 AM

## 2022-07-03 NOTE — Progress Notes (Signed)
  [facility] Adult Case Management Discharge Plan :  Will you be returning to the same living situation after discharge:  Yes,  Alonza Smoker (Friend)   [phone] At discharge, do you have transportation home?: Yes,  Friend Do you have the ability to pay for your medications: Yes,  Insured  Release of information consent forms completed and in the chart;  Patient's signature needed at discharge.  Patient to Follow up at:  Follow-up Information     [facility] Follow up on 07/05/2022.   Specialty: Behavioral Health Why: You are scheduled for an assessment to attend CDIOP on 07/05/2022 at 1:00pm.  We are located in the [place] on the 3rd floor and our building is directly across the street from [facility].  If you have questions or need to cancel please call [phone] Contact information: [facility] Follow up.   Specialty: Behavioral Health Why: Will be scheduled for therapy and medication management after participating in the [facility] program.  Please speak with Mikel Cella to begin this process. Contact information: [place] [phone]                Next level of care provider has access to [place] and Suicide Prevention discussed: Yes,  Alonza Smoker (Friend)   [phone]     Has patient been referred to the Quitline?: Patient refused referral  Patient has been referred for addiction treatment: Pt. refused referral Patient to continue working towards treatment goals after discharge. Patient no longer meets criteria for inpatient criteria per attending physician. Continue taking medications as prescribed, nursing to provide instructions at discharge. Follow up with all scheduled  appointments.    [place], LCSW 07/03/2022, 9:36 AM

## 2022-07-03 NOTE — [facility] Counselor (Deleted)
CSW provided the Pt with a packet that contains information including shelter and housing resources, free and reduced price food information, clothing resources, crisis center information, a [employer] card, and suicide prevention information.   

## 2022-07-03 NOTE — Progress Notes (Signed)
D:  Patient's self inventory sheet, patient sleeps good, sleep medication helpful.  Good appetite, high energy level, good concentration.  Rated depression 1, denied hopeless, anxiety 2.  Denied withdrawals.  Denied SI.  Denied physical problems.  Denied physical pain.  Goal is stay sober.  Plans to attend support groups.  Does have discharge plans. A:  Medications administered per MD orders.  Emotional support and encouragement given patient. R:  Denied SI and HI, contracts for safety.  Denied A/V hallucinations.  Safety maintained with 15 minute checks.

## 2022-07-03 NOTE — Plan of Care (Signed)
Nurse discussed anxiety, depressed and coping skills with patient.

## 2022-07-03 NOTE — Progress Notes (Signed)
Discharge Note:   Patient discharged with friend to friend's home.  Suicide prevention information given and discussed with patient who stated he understood and had no questions.  Patient denied SI and HI.  Denied A/V hallucinations.  Denied pain.  Patient stated he received all his belongings, clothing, toiletries, misc items, etc.  Patient stated he appreciated all assistance received from [facility] staff.  All required discharge information given.

## 2022-07-03 NOTE — Discharge Summary (Signed)
Physician Discharge Summary Note  Patient:  Charles Graves is an 41 y.o., male MRN:  [id number] DOB:  04/04/1981 Patient phone:  [phone] (home)  Patient address:   [address] [place] [address],  Total Time spent with patient: 15 minutes  Date of Admission:  06/28/2022 Date of Discharge: 07/03/2022  Reason for Admission:    Charles Graves is a 41 year old male with a past psychiatric history significant for severe alcohol dependence and major depressive disorder who was admitted under IVC to [facility] from [place] [facility] for suicidal ideations with a plan to ask [employer] to shoot him in the head.   Patient reports that he was admitted to [facility] due to alcohol abuse.  Patient added that in addition to his alcohol abuse, he was admitted to the hospital due to suicidal thoughts with a plan to run into traffic.  Patient states that he has been dealing with suicidal ideations for the past couple of days.  He reports that his suicidal thoughts are triggered by alcohol.  Patient elaborated that alcohol consumption and being without alcohol both trigger suicidal thoughts.  Patient states that he did not go through with his suicidal ideation because he wanted to see where his life was going to end up and he does not think that it is his time to go.  In addition to suicidal ideations, patient endorses depression that has been going on for several months.  The biggest triggers to his depression include the loss of this his grandmother and his dog.  Patient states that his family does not want to have anything to do with him.  Patient endorses the following depressive symptoms: insomnia, lack of interest in activities or hobbies, depressed mood, feelings of guilt/worthlessness, hopelessness, decreased concentration, self-isolation, decreased appetite, psychomotor agitation, psychomotor retardation, memory issues, and suicidal ideations.   In addition to his depressive symptoms, patient  reports that he has been diagnosed with manic depression and endorses the following symptoms: distractibility, elevated mood, racing thoughts, financial extravagance, irritability, mood swings, and sleep deficits.  Patient reports that his last manic episode was a month ago but he is unable to recall what happened during his manic episode.  Patient endorses anxiety and rates his anxiety an 8 out of 10.  Patient's anxiety consist of the following symptoms: agoraphobic, excessive worrying, and social anxiety.  Patient also endorses panic attacks stating that he has at least 2 panic attacks per day.  Patient is unable to identify any discernible triggers to his panic attacks.  Patient denies suicidal or homicidal ideations.  He further denies auditory or visual hallucinations and does not appear to be responding to internal/external stimuli.  Patient endorses some paranoia but denies delusional thoughts.  Patient reports that his sleep has not been that great and endorses fair appetite.  Principal Problem: MDD (major depressive disorder), severe ([place]) Discharge Diagnoses: Principal Problem:   MDD (major depressive disorder), severe ([place]) Active Problems:   Alcohol abuse   Past Psychiatric History:  Patient reports that he has a past psychiatric history significant for manic depression, PTSD, and schizophrenia.  Patient reports that he is unsure when he received his schizophrenia diagnosis.  During patient's most recent hospitalization at [facility], patient was being treated for major depressive disorder, severe and alcohol abuse.  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 No pertinent surgical history. Family History: History reviewed. No pertinent family history. Family Psychiatric  History:  Mother - patient forts that his  mother shot herself in the head, but survived Uncle (deceased) - patient reports that his uncle committed suicide Grandmother (deceased) - patient reports that his mother had mental health but is unsure of her diagnosis  Social History:  Social History   Substance and Sexual Activity  Alcohol Use Yes   Comment: state 1/2 pint/day     Social History   Substance and Sexual Activity  Drug Use Not Currently    Social History   Socioeconomic History   Marital status: Single    Spouse name: Not on file   Number of children: Not on file   Years of education: Not on file   Highest education level: Not on file  Occupational History   Not on file  Tobacco Use   Smoking status: Every Day    Packs/day: 0.50    Types: Cigarettes   Smokeless tobacco: Never  Vaping Use   Vaping Use: Never used  Substance and Sexual Activity   Alcohol use: Yes    Comment: state 1/2 pint/day   Drug use: Not Currently   Sexual activity: Not Currently  Other Topics Concern   Not on file  Social History Narrative   Not on file   Social Determinants of Health   Financial Resource Strain: Not on file  Food Insecurity: Food Insecurity Present (06/28/2022)   Hunger Vital Sign    Worried About Running Out of Food in the Last Year: Sometimes true    Ran Out of Food in the Last Year: Sometimes true  Transportation Needs: Unmet Transportation Needs (06/28/2022)   PRAPARE - Hydrologist (Medical): Yes    Lack of Transportation (Non-Medical): Yes  Physical Activity: Not on file  Stress: Not on file  Social Connections: Not on file    Hospital Course:    During the patient's hospitalization, patient had extensive initial psychiatric evaluation, and follow-up psychiatric evaluations every day.   Psychiatric diagnoses provided upon initial assessment: Major depressive disorder, severe and Alcohol abuse   Patient's psychiatric medications were adjusted on admission:  -Start  Cymbalta 60 mg daily -Start Seroquel 50 mg at bedtime -Start Chlordiazepoxide 25 mg 3 times daily   During the hospitalization, other adjustments were made to the patient's psychiatric medication regimen:  -Continued Cymbalta 60 mg daily -Increased Seroquel to 100 mg at bedtime -Discontinued Chlordiazepoxide   Patient's care was discussed during the interdisciplinary team meeting every day during the hospitalization.   The patient denies having side effects to prescribed psychiatric medication.   Gradually, patient started adjusting to milieu. The patient was evaluated each day by a clinical provider to ascertain response to treatment. Improvement was noted by the patient's report of decreasing symptoms, improved sleep and appetite, affect, medication tolerance, behavior, and participation in unit programming.  Patient was asked each day to complete a self inventory noting mood, mental status, pain, new symptoms, anxiety and concerns.     Symptoms were reported as significantly decreased or resolved completely by discharge.    On day of discharge, the patient reports that their mood is stable. The patient denied having suicidal thoughts for more than 48 hours prior to discharge.  Patient denies having homicidal thoughts.  Patient denies having auditory hallucinations.  Patient denies any visual hallucinations or  other symptoms of psychosis. The patient was motivated to continue taking medication with a goal of continued improvement in mental health.    The patient reports their target psychiatric symptoms of major depressive disorder and alcohol abuse responded well to the psychiatric medications, and the patient reports overall benefit other psychiatric hospitalization. Supportive psychotherapy was provided to the patient. The patient also participated in regular group therapy while hospitalized. Coping skills, problem solving as well as relaxation therapies were also part of the unit  programming.   Labs were reviewed with the patient, and abnormal results were discussed with the patient.   The patient is able to verbalize their individual safety plan to this provider.   # It is recommended to the patient to continue psychiatric medications as prescribed, after discharge from the hospital.     # It is recommended to the patient to follow up with your outpatient psychiatric provider and PCP.   # It was discussed with the patient, the impact of alcohol, drugs, tobacco have been there overall psychiatric and medical wellbeing, and total abstinence from substance use was recommended the patient.ed.   # Prescriptions provided or sent directly to preferred pharmacy at discharge. Patient agreeable to plan. Given opportunity to ask questions. Appears to feel comfortable with discharge.    # In the event of worsening symptoms, the patient is instructed to call the crisis hotline, 911 and or go to the nearest ED for appropriate evaluation and treatment of symptoms. To follow-up with primary care provider for other medical issues, concerns and or health care needs   # Patient was discharged home with a plan to follow up as noted below.  Physical Findings: AIMS:  , ,  ,  ,    CIWA:  CIWA-Ar Total: 2 COWS:     Musculoskeletal: Strength & Muscle Tone: within normal limits Gait & Station: normal Patient leans: N/A   Psychiatric Specialty Exam:  Presentation  General Appearance:  Appropriate for Environment; Casual; Fairly Groomed  Eye Contact: Good  Speech: Clear and Coherent; Normal Rate  Speech Volume: Normal  Handedness: Right   Mood and Affect  Mood: Euthymic  Affect: Appropriate   Thought Process  Thought Processes: Coherent; Goal Directed  Descriptions of Associations:Intact  Orientation:Full (Time, Place and Person)  Thought Content:Logical  History of Schizophrenia/Schizoaffective disorder:No  Duration of Psychotic Symptoms:No data  recorded Hallucinations:Hallucinations: None  Ideas of Reference:None  Suicidal Thoughts:Suicidal Thoughts: No  Homicidal Thoughts:Homicidal Thoughts: No   Sensorium  Memory: Immediate Good; Recent Good; Remote Good  Judgment: Fair  Insight: Present   Executive Functions  Concentration: Good  Attention Span: Good  Recall: Fair  Fund of Knowledge: Fair  Language: Good   Psychomotor Activity  Psychomotor Activity: Psychomotor Activity: Normal   Assets  Assets: Communication Skills; Desire for Improvement; Physical Health   Sleep  Sleep: Sleep: Good Number of Hours of Sleep: 5    Physical Exam: Physical Exam Constitutional:      Appearance: Normal appearance.  HENT:     Head: Normocephalic and atraumatic.     Nose: Nose normal.     Mouth/Throat:     Mouth: Mucous membranes are moist.  Eyes:     Extraocular Movements: Extraocular movements intact.     Pupils: Pupils are equal, round, and reactive to light.  Cardiovascular:     Rate and Rhythm: Normal rate and regular rhythm.  Pulmonary:     Effort: Pulmonary effort is normal.     Breath sounds: Normal  breath sounds.  Abdominal:     General: Abdomen is flat.  Musculoskeletal:        General: Normal range of motion.     Cervical back: Normal range of motion and neck supple.  Skin:    General: Skin is warm and dry.  Neurological:     General: No focal deficit present.     Mental Status: He is alert and oriented to person, place, and time.  Psychiatric:        Attention and Perception: Attention and perception normal.        Mood and Affect: Mood and affect normal. Mood is not anxious or depressed.        Speech: Speech normal.        Behavior: Behavior normal. Behavior is cooperative.        Thought Content: Thought content normal. Thought content is not paranoid or delusional. Thought content does not include homicidal or suicidal ideation.        Cognition and Memory: Cognition and  memory normal.        Judgment: Judgment normal.    Review of Systems  Constitutional: Negative.   HENT: Negative.    Eyes: Negative.   Respiratory: Negative.    Cardiovascular: Negative.   Gastrointestinal: Negative.   Skin: Negative.   Neurological: Negative.   Psychiatric/Behavioral:  Positive for substance abuse. Negative for depression, hallucinations and suicidal ideas. The patient is not nervous/anxious and does not have insomnia.    Blood pressure 91/77, pulse (!) 106, temperature 98.3 F (36.8 C), temperature source Oral, resp. rate 18, height '5\' 8"'$  (1.727 m), weight 55.3 kg, SpO2 99 %. Body mass index is 18.55 kg/m.   Social History   Tobacco Use  Smoking Status Every Day   Packs/day: 0.50   Types: Cigarettes  Smokeless Tobacco Never   Tobacco Cessation:  N/A, patient does not currently use tobacco products   Blood Alcohol level:  Lab Results  Component Value Date   ETH 344 ([place]) 06/27/2022   ETH 209 (H) 67/59/1638    Metabolic Disorder Labs:  Lab Results  Component Value Date   HGBA1C 5.3 06/30/2022   MPG 105.41 06/30/2022   No results found for: "PROLACTIN" Lab Results  Component Value Date   CHOL 236 ([facility]) 06/30/2022   TRIG 110 06/30/2022   HDL 53 06/30/2022   CHOLHDL 4.5 06/30/2022   VLDL 22 06/30/2022   LDLCALC 161 ([facility]) 06/30/2022    See Psychiatric Specialty Exam and Suicide Risk Assessment completed by Attending Physician prior to discharge.  Discharge destination:  Home  Is patient on multiple antipsychotic therapies at discharge:  No   Has Patient had three or more failed trials of antipsychotic monotherapy by history:  No  Recommended Plan for Multiple Antipsychotic Therapies: NA  Discharge Instructions     Diet - low sodium heart healthy   Complete by: As directed    Increase activity slowly   Complete by: As directed       Allergies as of 07/03/2022       Reactions   Oxycodone Hcl Diarrhea, Nausea And Vomiting, Swelling    Ativan [lorazepam] Nausea And Vomiting        Medication List     STOP taking these medications    famotidine 20 MG tablet Commonly known as: PEPCID   folic acid 1 MG tablet Commonly known as: FOLVITE   HYDRALAZINE HCL PO   pantoprazole 40 MG tablet Commonly known as: PROTONIX  TAKE these medications      Indication  amLODipine 10 MG tablet Commonly known as: NORVASC Take 1 tablet (10 mg total) by mouth daily.  Indication: High Blood Pressure Disorder   DULoxetine 60 MG capsule Commonly known as: CYMBALTA Take 1 capsule (60 mg total) by mouth daily. Start taking on: July 04, 2022 What changed:  medication strength how much to take  Indication: Generalized Anxiety Disorder, Major Depressive Disorder   QUEtiapine 100 MG tablet Commonly known as: SEROQUEL Take 1 tablet (100 mg total) by mouth at bedtime.  Indication: Generalized Anxiety Disorder, Major Depressive Disorder   traZODone 50 MG tablet Commonly known as: DESYREL Take 1 tablet (50 mg total) by mouth at bedtime as needed for sleep.  Indication: Abuse or Misuse of Alcohol, [facility] Follow up on 07/05/2022.   Specialty: Behavioral Health Why: You are scheduled for an assessment to attend [facility] on 07/05/2022 at 1:00pm.  We are located in the [facility] on the 3rd floor and our building is directly across the street from [facility].  If you have questions or need to cancel please call [phone] Contact information: [facility] Follow up.   Specialty: Behavioral Health Why: Will be scheduled for therapy and medication management after participating in the [facility] program.  Please speak with Mikel Cella to begin this process. Contact  information: [place] [phone]                Follow-up recommendations:    Activity: As tolerated  Diet: Heart healthy  Other: -Follow-up with your outpatient psychiatric provider -instructions on appointment date, time, and address (location) are provided to you in discharge paperwork.   -Take your psychiatric medications as prescribed at discharge - instructions are provided to you in the discharge paperwork.    -Follow-up with outpatient primary care doctor and other specialists -for management of preventative medicine and chronic medical disease, including:   -Testing: Follow-up with outpatient provider for abnormal lab results:  none   -Recommend abstinence from alcohol, tobacco, and other illicit drug use at discharge.    -If your psychiatric symptoms recur, worsen, or if you have side effects to your psychiatric medications, call your outpatient psychiatric provider, 911, [phone] or go to the nearest emergency department.   -If suicidal thoughts recur, call your outpatient psychiatric provider, 911, [phone] or go to the nearest emergency department.   Signed: Malachy Mood, PA 07/03/2022, 10:21 AM

## 2022-07-05 ENCOUNTER — Ambulatory Visit (HOSPITAL_COMMUNITY): Payer: PRIVATE HEALTH INSURANCE | Admitting: Licensed Clinical Social Worker

## 2022-07-05 ENCOUNTER — Telehealth (HOSPITAL_COMMUNITY): Payer: Self-pay | Admitting: Licensed Clinical Social Worker

## 2022-07-05 ENCOUNTER — Encounter (HOSPITAL_COMMUNITY): Payer: Self-pay | Admitting: Licensed Clinical Social Worker

## 2022-07-05 NOTE — Telephone Encounter (Signed)
The therapist attempts to reach Sandoval at both is contact numbers when he does not show for his scheduled appointment today at 1 p.m.; however, neither are valid contact numbers for him.  Thus, the therapist will send no show correspondence to him via postal mail.  [address] MA, LCSW, [facility], LCAS 07/05/2022

## 2022-07-16 ENCOUNTER — Emergency Department (HOSPITAL_COMMUNITY)
Admission: EM | Admit: 2022-07-16 | Discharge: 2022-07-16 | Disposition: A | Discharge status: Home / Self Care | Payer: PRIVATE HEALTH INSURANCE | Attending: Emergency Medicine | Admitting: Emergency Medicine

## 2022-07-16 ENCOUNTER — Emergency Department (HOSPITAL_COMMUNITY): Payer: PRIVATE HEALTH INSURANCE

## 2022-07-16 ENCOUNTER — Other Ambulatory Visit: Payer: Self-pay

## 2022-07-16 ENCOUNTER — Encounter (HOSPITAL_COMMUNITY): Payer: Self-pay

## 2022-07-16 DIAGNOSIS — W0110XA Fall on same level from slipping, tripping and stumbling with subsequent striking against unspecified object, initial encounter: Secondary | ICD-10-CM | POA: Insufficient documentation

## 2022-07-16 DIAGNOSIS — S8991XA Unspecified injury of right lower leg, initial encounter: Secondary | ICD-10-CM | POA: Diagnosis not present

## 2022-07-16 DIAGNOSIS — Z79899 Other long term (current) drug therapy: Secondary | ICD-10-CM | POA: Insufficient documentation

## 2022-07-16 DIAGNOSIS — S8992XA Unspecified injury of left lower leg, initial encounter: Secondary | ICD-10-CM | POA: Diagnosis not present

## 2022-07-16 DIAGNOSIS — R569 Unspecified convulsions: Secondary | ICD-10-CM | POA: Insufficient documentation

## 2022-07-16 LAB — COMPREHENSIVE METABOLIC PANEL
ALT: 18 U/L (ref 0–44)
AST: 28 U/L (ref 15–41)
Albumin: 4 g/dL (ref 3.5–5.0)
Alkaline Phosphatase: 64 U/L (ref 38–126)
Anion gap: 12 (ref 5–15)
BUN: 8 mg/dL (ref 6–20)
CO2: 24 mmol/L (ref 22–32)
Calcium: 8.9 mg/dL (ref 8.9–10.3)
Chloride: 104 mmol/L (ref 98–111)
Creatinine, Ser: 0.76 mg/dL (ref 0.61–1.24)
GFR, Estimated: >60 mL/min (ref >60)
Glucose, Bld: 115 mg/dL — ABNORMAL HIGH (ref 70–99)
Potassium: 3.2 mmol/L — ABNORMAL LOW (ref 3.5–5.1)
Sodium: 140 mmol/L (ref 135–145)
Total Bilirubin: 0.7 mg/dL (ref 0.3–1.2)
Total Protein: 6.8 g/dL (ref 6.5–8.1)

## 2022-07-16 LAB — CBC WITH DIFFERENTIAL/PLATELET
Abs Immature Granulocytes: 0.02 10*3/uL (ref 0.00–0.07)
Basophils Absolute: 0 10*3/uL (ref 0.0–0.1)
Basophils Relative: 0 %
Eosinophils Absolute: 0.2 10*3/uL (ref 0.0–0.5)
Eosinophils Relative: 2 %
HCT: 44.5 % (ref 39.0–52.0)
Hemoglobin: 15.3 g/dL (ref 13.0–17.0)
Immature Granulocytes: 0 %
Lymphocytes Relative: 28 %
Lymphs Abs: 2.1 10*3/uL (ref 0.7–4.0)
MCH: 32.3 pg (ref 26.0–34.0)
MCHC: 34.4 g/dL (ref 30.0–36.0)
MCV: 93.9 fL (ref 80.0–100.0)
Monocytes Absolute: 0.5 10*3/uL (ref 0.1–1.0)
Monocytes Relative: 7 %
Neutro Abs: 4.7 10*3/uL (ref 1.7–7.7)
Neutrophils Relative %: 63 %
Platelets: 249 10*3/uL (ref 150–400)
RBC: 4.74 MIL/uL (ref 4.22–5.81)
RDW: 13.2 % (ref 11.5–15.5)
WBC: 7.5 10*3/uL (ref 4.0–10.5)
nRBC: 0 % (ref 0.0–0.2)

## 2022-07-16 LAB — ETHANOL: Alcohol, Ethyl (B): 178 mg/dL — ABNORMAL HIGH (ref <10)

## 2022-07-16 NOTE — ED Notes (Signed)
Patient transported to CT 

## 2022-07-16 NOTE — ED Provider Notes (Signed)
[facility] DEPT Provider Note   CSN: [id number] Arrival date & time: 07/16/22  0103     History  Chief Complaint  Patient presents with   Seizures    Charles Graves is a 41 y.o. male.  41 yo M With a chief complaints of a fall.  He tells me that he had a seizure and collapsed to the ground.  Injured both of his knees and struck the back of his head.  He is worried that he might have recurrent intracranial hemorrhage as he has had that in the past with bumping his head.  He tells me he has a history of seizures but does not take anything for it.  Has been an ongoing problem for him.  He has pain pretty much all over his body but worse to the knees and the back of the head.   Seizures      Home Medications Prior to Admission medications   Medication Sig Start Date End Date Taking? Authorizing Provider  amLODipine (NORVASC) 10 MG tablet Take 1 tablet (10 mg total) by mouth daily. 07/03/22   Nwoko, Terese Door, PA  DULoxetine (CYMBALTA) 60 MG capsule Take 1 capsule (60 mg total) by mouth daily. 07/04/22   Nwoko, Terese Door, PA  QUEtiapine (SEROQUEL) 100 MG tablet Take 1 tablet (100 mg total) by mouth at bedtime. 07/03/22   Nwoko, Terese Door, PA  traZODone (DESYREL) 50 MG tablet Take 1 tablet (50 mg total) by mouth at bedtime as needed for sleep. 07/03/22   Nwoko, Terese Door, PA      Allergies    Oxycodone hcl and Ativan [lorazepam]    Review of Systems   Review of Systems  Neurological:  Positive for seizures.    Physical Exam Updated Vital Signs BP (!) 130/90 (BP Location: Left Arm)   Pulse (!) 107   Temp (!) 97.5 F (36.4 C) (Oral)   Resp 18   Ht '5\' 8"'$  (1.727 m)   Wt 53.5 kg   SpO2 96%   BMI 17.94 kg/m  Physical Exam  ED Results / Procedures / Treatments   Labs (all labs ordered are listed, but only abnormal results are displayed) Labs Reviewed  COMPREHENSIVE METABOLIC PANEL - Abnormal; Notable for the following components:      Result  Value   Potassium 3.2 (*)    Glucose, Bld 115 (*)    All other components within normal limits  ETHANOL - Abnormal; Notable for the following components:   Alcohol, Ethyl (B) 178 (*)    All other components within normal limits  CBC WITH DIFFERENTIAL/PLATELET    EKG None  Radiology DG Knee Complete 4 Views Left  Result Date: 07/16/2022 CLINICAL DATA:  Status post seizure with left knee pain. EXAM: LEFT KNEE - COMPLETE 4+ VIEW COMPARISON:  None Available. FINDINGS: No evidence of fracture, dislocation, or joint effusion. No evidence of arthropathy or other focal bone abnormality. Soft tissues are unremarkable. IMPRESSION: Negative. Electronically Signed   By: Virgina Norfolk M.D.   On: 07/16/2022 03:05   DG Knee Complete 4 Views Right  Result Date: 07/16/2022 CLINICAL DATA:  Status post seizure with right knee pain. EXAM: RIGHT KNEE - COMPLETE 4+ VIEW COMPARISON:  February 19, 2022 FINDINGS: No evidence of fracture, dislocation, or joint effusion. No evidence of arthropathy or other focal bone abnormality. Soft tissues are unremarkable. IMPRESSION: Negative. Electronically Signed   By: Virgina Norfolk M.D.   On: 07/16/2022 03:04   CT  Cervical Spine Wo Contrast  Result Date: 07/16/2022 CLINICAL DATA:  Witnessed seizure. EXAM: CT CERVICAL SPINE WITHOUT CONTRAST TECHNIQUE: Multidetector CT imaging of the cervical spine was performed without intravenous contrast. Multiplanar CT image reconstructions were also generated. RADIATION DOSE REDUCTION: This exam was performed according to the departmental dose-optimization program which includes automated exposure control, adjustment of the mA and/or kV according to patient size and/or use of iterative reconstruction technique. COMPARISON:  August 25, 2021 FINDINGS: Alignment: There is stable approximately 1 mm to 2 mm retrolisthesis of the C4 vertebral body on C5 and C5 vertebral body on C6. Skull base and vertebrae: No acute fracture. No primary bone  lesion or focal pathologic process. Soft tissues and spinal canal: No prevertebral fluid or swelling. No visible canal hematoma. Disc levels: Mild anterior osteophyte formation is seen at the level of C6-C7, with mild posterior bony spurring noted at the levels of C4-C5 and C5-C6. Posterior, mild to moderate severity intervertebral disc space narrowing is seen at the levels of C4-C5 and C5-C6, with mild to moderate severity intervertebral disc space narrowing also seen at C6-C7. Normal, bilateral multilevel facet joints are noted. Upper chest: Negative. Other: None. IMPRESSION: 1. No acute fracture or subluxation in the cervical spine. 2. Mild to moderate severity degenerative changes at the levels of C4-C5 and C5-C6. 3. Stable, approximately 1 mm to 2 mm retrolisthesis of the C4 vertebral body on C5 and C5 vertebral body on C6. Electronically Signed   By: Virgina Norfolk M.D.   On: 07/16/2022 03:02   CT Head Wo Contrast  Result Date: 07/16/2022 CLINICAL DATA:  Status post seizure. EXAM: CT HEAD WITHOUT CONTRAST TECHNIQUE: Contiguous axial images were obtained from the base of the skull through the vertex without intravenous contrast. RADIATION DOSE REDUCTION: This exam was performed according to the departmental dose-optimization program which includes automated exposure control, adjustment of the mA and/or kV according to patient size and/or use of iterative reconstruction technique. COMPARISON:  June 17, 2022 FINDINGS: Brain: No evidence of acute infarction, hemorrhage, hydrocephalus, extra-axial collection or mass lesion/mass effect. Mild chronic periventricular white matter low attenuation is seen. Vascular: No hyperdense vessel or unexpected calcification. Skull: Normal. Negative for fracture or focal lesion. Sinuses/Orbits: No acute finding. Other: None. IMPRESSION: 1. No acute intracranial abnormality. 2. Mild chronic small vessel ischemic changes. Electronically Signed   By: Virgina Norfolk M.D.    On: 07/16/2022 02:58    Procedures Procedures    Medications Ordered in ED Medications - No data to display  ED Course/ Medical Decision Making/ A&P                           Medical Decision Making Amount and/or Complexity of Data Reviewed Labs: ordered. Radiology: ordered.   41 yo M With a chief complaints of a fall.  The patient told me that he had multiple seizures today and that caused him to fall and hurt his knees and the back of his head.  He has some mild bruising to his knee.  No signs of trauma to the head of the neck.  We will obtain a CT of the head and C-spine.  Plain film of the knees bilaterally.  Plain film of the knees independently interpreted by me negative for fracture.  CT of the head and C-spine without acute finding.  We will discharge the patient home.  Given neurology follow-up for possible seizures with no neurology follow-up in the past.  3:47 AM:  I have discussed the diagnosis/risks/treatment options with the patient.  Evaluation and diagnostic testing in the emergency department does not suggest an emergent condition requiring admission or immediate intervention beyond what has been performed at this time.  They will follow up with PCP, neuro. We also discussed returning to the ED immediately if new or worsening sx occur. We discussed the sx which are most concerning (e.g., sudden worsening pain, fever, inability to tolerate by mouth) that necessitate immediate return. Medications administered to the patient during their visit and any new prescriptions provided to the patient are listed below.  Medications given during this visit Medications - No data to display   The patient appears reasonably screen and/or stabilized for discharge and I doubt any other medical condition or other [facility] requiring further screening, evaluation, or treatment in the ED at this time prior to discharge.          Final Clinical Impression(s) / ED Diagnoses Final diagnoses:   Seizure-like activity ([place])    Rx / DC Orders ED Discharge Orders          Ordered    Ambulatory referral to Neurology       Comments: Seizures?   07/16/22 [place], [place], DO 07/16/22 [phone]

## 2022-07-16 NOTE — Discharge Instructions (Signed)
I have place a referral to the neurologist.  Follow up with your doctor.  Return for worsening symptoms.   Your CT scan did not show any acute problem in your brain or in your neck.  No broken bones to your knees.  Take 4 over the counter ibuprofen tablets 3 times a day or 2 over-the-counter naproxen tablets twice a day for pain. Also take tylenol '1000mg'$ (2 extra strength) four times a day.

## 2022-07-16 NOTE — ED Notes (Signed)
Patient transported to X-ray 

## 2022-07-16 NOTE — ED Triage Notes (Signed)
Patient thinks he had another seizure at home, patient was initially confused when EMS arrived, but is now alert and oriented x4, he is complaining of right knee pain and chest pain. Also states he has a hx of brain bleeds.

## 2022-07-26 ENCOUNTER — Ambulatory Visit (HOSPITAL_COMMUNITY)
Admission: EM | Admit: 2022-07-26 | Discharge: 2022-08-01 | Disposition: A | Discharge status: Other Acute Inpatient Hospital | Payer: PRIVATE HEALTH INSURANCE | Attending: Psychiatry | Admitting: Psychiatry

## 2022-07-26 DIAGNOSIS — F339 Major depressive disorder, recurrent, unspecified: Secondary | ICD-10-CM | POA: Diagnosis present

## 2022-07-26 DIAGNOSIS — Z8659 Personal history of other mental and behavioral disorders: Secondary | ICD-10-CM

## 2022-07-26 DIAGNOSIS — F419 Anxiety disorder, unspecified: Secondary | ICD-10-CM | POA: Insufficient documentation

## 2022-07-26 DIAGNOSIS — R440 Auditory hallucinations: Secondary | ICD-10-CM | POA: Insufficient documentation

## 2022-07-26 DIAGNOSIS — F17200 Nicotine dependence, unspecified, uncomplicated: Secondary | ICD-10-CM

## 2022-07-26 DIAGNOSIS — Z9151 Personal history of suicidal behavior: Secondary | ICD-10-CM | POA: Diagnosis not present

## 2022-07-26 DIAGNOSIS — F1920 Other psychoactive substance dependence, uncomplicated: Secondary | ICD-10-CM | POA: Insufficient documentation

## 2022-07-26 DIAGNOSIS — Z720 Tobacco use: Secondary | ICD-10-CM | POA: Diagnosis present

## 2022-07-26 DIAGNOSIS — R441 Visual hallucinations: Secondary | ICD-10-CM | POA: Diagnosis not present

## 2022-07-26 DIAGNOSIS — F17210 Nicotine dependence, cigarettes, uncomplicated: Secondary | ICD-10-CM | POA: Insufficient documentation

## 2022-07-26 DIAGNOSIS — Z79899 Other long term (current) drug therapy: Secondary | ICD-10-CM | POA: Insufficient documentation

## 2022-07-26 DIAGNOSIS — R45851 Suicidal ideations: Secondary | ICD-10-CM | POA: Insufficient documentation

## 2022-07-26 DIAGNOSIS — Z1152 Encounter for screening for COVID-19: Secondary | ICD-10-CM | POA: Insufficient documentation

## 2022-07-26 DIAGNOSIS — F1020 Alcohol dependence, uncomplicated: Secondary | ICD-10-CM | POA: Diagnosis present

## 2022-07-26 DIAGNOSIS — Z765 Malingerer [conscious simulation]: Secondary | ICD-10-CM | POA: Insufficient documentation

## 2022-07-26 DIAGNOSIS — Z59819 Housing instability, housed unspecified: Secondary | ICD-10-CM | POA: Diagnosis present

## 2022-07-26 DIAGNOSIS — F322 Major depressive disorder, single episode, severe without psychotic features: Secondary | ICD-10-CM | POA: Diagnosis present

## 2022-07-26 MED ORDER — ADULT MULTIVITAMIN W/MINERALS CH
1.0000 | ORAL_TABLET | Freq: Every day | ORAL | Status: DC
  Administered 2022-07-27 – 2022-07-28 (×2): 1 via ORAL
  Filled 2022-07-26 (×2): qty 1

## 2022-07-26 MED ORDER — DICYCLOMINE HCL 20 MG PO TABS: 20.0000 mg | ORAL_TABLET | Freq: Four times a day (QID) | ORAL | Status: AC | PRN

## 2022-07-26 MED ORDER — ALUM & MAG HYDROXIDE-SIMETH 200-200-20 MG/5ML PO SUSP: 30.0000 mL | ORAL | Status: DC | PRN

## 2022-07-26 MED ORDER — QUETIAPINE FUMARATE 100 MG PO TABS
100.0000 mg | ORAL_TABLET | Freq: Every day | ORAL | Status: DC
  Administered 2022-07-27 – 2022-07-31 (×6): 100 mg via ORAL
  Filled 2022-07-26 (×2): qty 1
  Filled 2022-07-26: qty 14
  Filled 2022-07-26: qty 1
  Filled 2022-07-26: qty 14
  Filled 2022-07-26 (×3): qty 1

## 2022-07-26 MED ORDER — ONDANSETRON 4 MG PO TBDP: 4.0000 mg | ORAL_TABLET | Freq: Four times a day (QID) | ORAL | Status: AC | PRN

## 2022-07-26 MED ORDER — AMLODIPINE BESYLATE 10 MG PO TABS
10.0000 mg | ORAL_TABLET | Freq: Every day | ORAL | Status: DC
  Administered 2022-07-27 – 2022-07-28 (×2): 10 mg via ORAL
  Filled 2022-07-26 (×2): qty 1

## 2022-07-26 MED ORDER — METHOCARBAMOL 500 MG PO TABS: 500.0000 mg | ORAL_TABLET | Freq: Three times a day (TID) | ORAL | Status: AC | PRN

## 2022-07-26 MED ORDER — DULOXETINE HCL 60 MG PO CPEP
60.0000 mg | ORAL_CAPSULE | Freq: Every day | ORAL | Status: DC
  Administered 2022-07-27 – 2022-07-28 (×2): 60 mg via ORAL
  Filled 2022-07-26 (×2): qty 1

## 2022-07-26 MED ORDER — THIAMINE HCL 100 MG/ML IJ SOLN
100.0000 mg | Freq: Once | INTRAMUSCULAR | Status: AC
  Administered 2022-07-27: 100 mg via INTRAMUSCULAR
  Filled 2022-07-26: qty 2

## 2022-07-26 MED ORDER — HYDROXYZINE HCL 25 MG PO TABS
25.0000 mg | ORAL_TABLET | Freq: Four times a day (QID) | ORAL | Status: AC | PRN
  Filled 2022-07-26: qty 20

## 2022-07-26 MED ORDER — TRAZODONE HCL 50 MG PO TABS
50.0000 mg | ORAL_TABLET | Freq: Every evening | ORAL | Status: DC | PRN
  Filled 2022-07-26: qty 14
  Filled 2022-07-26: qty 1

## 2022-07-26 MED ORDER — ACETAMINOPHEN 325 MG PO TABS: 650.0000 mg | ORAL_TABLET | Freq: Four times a day (QID) | ORAL | Status: DC | PRN

## 2022-07-26 MED ORDER — THIAMINE MONONITRATE 100 MG PO TABS
100.0000 mg | ORAL_TABLET | Freq: Every day | ORAL | Status: DC
  Administered 2022-07-27 – 2022-07-28 (×2): 100 mg via ORAL
  Filled 2022-07-26 (×2): qty 1

## 2022-07-26 MED ORDER — MAGNESIUM HYDROXIDE 400 MG/5ML PO SUSP: 30.0000 mL | Freq: Every day | ORAL | Status: DC | PRN

## 2022-07-26 MED ORDER — LOPERAMIDE HCL 2 MG PO CAPS: 2.0000 mg | ORAL_CAPSULE | ORAL | Status: AC | PRN

## 2022-07-26 MED ORDER — NAPROXEN 500 MG PO TABS: 500.0000 mg | ORAL_TABLET | Freq: Two times a day (BID) | ORAL | Status: AC | PRN
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
## 2022-07-26 NOTE — ED Provider Notes (Signed)
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                 200 Opiates and metabolites        300 Cocaine and metabolites        300 THC                             50 Performed at [facility], [place] [address] [place], [place] [address]    SARS Coronavirus 2 by RT PCR 06/27/2022 NEGATIVE  NEGATIVE Final   Comment: (NOTE) SARS-CoV-2 target nucleic acids are NOT DETECTED.  The SARS-CoV-2 RNA is generally detectable in upper respiratory specimens during the acute phase of infection. The lowest concentration of SARS-CoV-2 viral copies this assay can detect is 138 copies/mL. A negative result does not preclude SARS-Cov-2 infection and should not be used as the sole basis for treatment or other patient management decisions. A negative result may occur with  improper specimen collection/handling, submission of specimen other than nasopharyngeal swab, presence of viral mutation(s) within the areas targeted by this assay, and inadequate number of viral copies(<138 copies/mL). A negative result must be combined with clinical observations, patient history, and epidemiological information. The expected result is Negative.  Fact Sheet for Patients:  EntrepreneurPulse.com.au  Fact Sheet for Healthcare Providers:  IncredibleEmployment.be  This test is no                          t yet approved or cleared by the [place] FDA and  has been authorized for detection and/or diagnosis of SARS-CoV-2 by FDA under an Emergency Use Authorization (EUA). This EUA will remain  in effect (meaning this test can be used) for the duration of the COVID-19 declaration under Section 564(b)(1) of the Act, 21 U.S.C.section 360bbb-3(b)(1), unless the authorization is terminated  or revoked sooner.       Influenza A by PCR 06/27/2022 NEGATIVE  NEGATIVE Final   Influenza B by PCR 06/27/2022 NEGATIVE  NEGATIVE Final   Comment: (NOTE) The Xpert Xpress SARS-CoV-2/FLU/RSV plus assay is intended as an aid in the diagnosis of influenza from Nasopharyngeal swab specimens and should not be used as a sole basis for  treatment. Nasal washings and aspirates are unacceptable for Xpert Xpress SARS-CoV-2/FLU/RSV testing.  Fact Sheet for Patients: EntrepreneurPulse.com.au  Fact Sheet for Healthcare Providers: IncredibleEmployment.be  This test is not yet approved or cleared by the [place] FDA and has been authorized for detection and/or diagnosis of SARS-CoV-2 by FDA under an Emergency Use Authorization (EUA). This EUA will remain in effect (meaning this test can be used) for the duration of the COVID-19 declaration under Section 564(b)(1) of the Act, 21 U.S.C. section 360bbb-3(b)(1), unless the authorization is terminated or revoked.  Performed at [facility], [place] [address] [place], [place] [address]   Admission on 06/17/2022, Discharged on 06/17/2022  Component Date Value Ref Range Status   Alcohol, Ethyl (B) 06/17/2022 209 (H)  <10 mg/dL Final   Comment: (NOTE) Lowest detectable limit for serum alcohol is 10 mg/dL.  For medical purposes only. Performed at [facility], [address] [place], [place] [address]    Prothrombin Time 06/17/2022 12.8  11.4 - 15.2 seconds Final   INR 06/17/2022 1.0  0.8 - 1.2 Final   Comment: (NOTE) INR  goal varies based on device and disease states. Performed at [facility], [address] [place], [place] [address]    aPTT 06/17/2022 28  24 - 36 seconds Final   Performed at [facility], [address] [place], [place] [address]   WBC 06/17/2022 6.9  4.0 - 10.5 K/uL Final   RBC 06/17/2022 4.99  4.22 - 5.81 MIL/uL Final   Hemoglobin 06/17/2022 15.9  13.0 - 17.0 g/dL Final   HCT 06/17/2022 47.3  39.0 - 52.0 % Final   MCV 06/17/2022 94.8  80.0 - 100.0 fL Final   MCH 06/17/2022 31.9  26.0 - 34.0 pg Final   MCHC 06/17/2022 33.6  30.0 - 36.0 g/dL Final   RDW 06/17/2022 12.8  11.5 - 15.5 % Final   Platelets 06/17/2022 262  150 - 400 K/uL Final   nRBC 06/17/2022  0.0  0.0 - 0.2 % Final   Performed at [facility], [address] [place], [place] [address]   Neutrophils Relative % 06/17/2022 55  % Final   Neutro Abs 06/17/2022 3.9  1.7 - 7.7 K/uL Final   Lymphocytes Relative 06/17/2022 33  % Final   Lymphs Abs 06/17/2022 2.3  0.7 - 4.0 K/uL Final   Monocytes Relative 06/17/2022 7  % Final   Monocytes Absolute 06/17/2022 0.5  0.1 - 1.0 K/uL Final   Eosinophils Relative 06/17/2022 4  % Final   Eosinophils Absolute 06/17/2022 0.2  0.0 - 0.5 K/uL Final   Basophils Relative 06/17/2022 1  % Final   Basophils Absolute 06/17/2022 0.1  0.0 - 0.1 K/uL Final   Immature Granulocytes 06/17/2022 0  % Final   Abs Immature Granulocytes 06/17/2022 0.01  0.00 - 0.07 K/uL Final   Performed at [facility], [address] [place], [place] [address]   Sodium 06/17/2022 141  135 - 145 mmol/L Final   Potassium 06/17/2022 3.3 (L)  3.5 - 5.1 mmol/L Final   Chloride 06/17/2022 105  98 - 111 mmol/L Final   CO2 06/17/2022 27  22 - 32 mmol/L Final   Glucose, Bld 06/17/2022 96  70 - 99 mg/dL Final   Glucose reference range applies only to samples taken after fasting for at least 8 hours.   BUN 06/17/2022 7  6 - 20 mg/dL Final   Creatinine, Ser 06/17/2022 0.74  0.61 - 1.24 mg/dL Final   Calcium 06/17/2022 8.6 (L)  8.9 - 10.3 mg/dL Final   Total Protein 06/17/2022 7.2  6.5 - 8.1 g/dL Final   Albumin 06/17/2022 4.1  3.5 - 5.0 g/dL Final   AST 06/17/2022 28  15 - 41 U/L Final   ALT 06/17/2022 32  0 - 44 U/L Final   Alkaline Phosphatase 06/17/2022 83  38 - 126 U/L Final   Total Bilirubin 06/17/2022 0.5  0.3 - 1.2 mg/dL Final   GFR, Estimated 06/17/2022 >60  >60 mL/min Final   Comment: (NOTE) Calculated using the CKD-EPI Creatinine Equation (2021)    Anion gap 06/17/2022 9  5 - 15 Final   Performed at [facility], [address] [place], [place] [address]   Glucose-Capillary 06/17/2022 90  70 - 99 mg/dL Final   Glucose reference range  applies only to samples taken after fasting for at least 8 hours.  Admission on 05/02/2022, Discharged on 05/03/2022  Component Date Value Ref Range Status   Sodium 05/02/2022 146 (H)  135 -  145 mmol/L Final   Potassium 05/02/2022 3.9  3.5 - 5.1 mmol/L Final   Chloride 05/02/2022 106  98 - 111 mmol/L Final   CO2 05/02/2022 29  22 - 32 mmol/L Final   Glucose, Bld 05/02/2022 98  70 - 99 mg/dL Final   Glucose reference range applies only to samples taken after fasting for at least 8 hours.   BUN 05/02/2022 5 (L)  6 - 20 mg/dL Final   Creatinine, Ser 05/02/2022 0.81  0.61 - 1.24 mg/dL Final   Calcium 05/02/2022 8.9  8.9 - 10.3 mg/dL Final   Total Protein 05/02/2022 6.8  6.5 - 8.1 g/dL Final   Albumin 05/02/2022 4.2  3.5 - 5.0 g/dL Final   AST 05/02/2022 39  15 - 41 U/L Final   ALT 05/02/2022 35  0 - 44 U/L Final   Alkaline Phosphatase 05/02/2022 74  38 - 126 U/L Final   Total Bilirubin 05/02/2022 0.6  0.3 - 1.2 mg/dL Final   GFR, Estimated 05/02/2022 >60  >60 mL/min Final   Comment: (NOTE) Calculated using the CKD-EPI Creatinine Equation (2021)    Anion gap 05/02/2022 11  5 - 15 Final   Performed at [place] [address], [place], [place] [address]   Alcohol, Ethyl (B) 05/02/2022 327 (HH)  <10 mg/dL Final   Comment: CRITICAL RESULT CALLED TO, READ BACK BY AND VERIFIED WITH K.MUNNETT, RN [id number] 08.23.23 MRIVET (NOTE) Lowest detectable limit for serum alcohol is 10 mg/dL.  For medical purposes only. Performed at [facility], [place] [address] [place], [place] [address]    WBC 05/02/2022 5.5  4.0 - 10.5 K/uL Final   RBC 05/02/2022 4.81  4.22 - 5.81 MIL/uL Final   Hemoglobin 05/02/2022 15.7  13.0 - 17.0 g/dL Final   HCT 05/02/2022 47.1  39.0 - 52.0 % Final   MCV 05/02/2022 97.9  80.0 - 100.0 fL Final   MCH 05/02/2022 32.6  26.0 - 34.0 pg Final   MCHC 05/02/2022 33.3  30.0 - 36.0 g/dL Final   RDW 05/02/2022 12.3  11.5 - 15.5 % Final   Platelets 05/02/2022 233  150 -  400 K/uL Final   nRBC 05/02/2022 0.0  0.0 - 0.2 % Final   Neutrophils Relative % 05/02/2022 49  % Final   Neutro Abs 05/02/2022 2.6  1.7 - 7.7 K/uL Final   Lymphocytes Relative 05/02/2022 42  % Final   Lymphs Abs 05/02/2022 2.3  0.7 - 4.0 K/uL Final   Monocytes Relative 05/02/2022 6  % Final   Monocytes Absolute 05/02/2022 0.3  0.1 - 1.0 K/uL Final   Eosinophils Relative 05/02/2022 2  % Final   Eosinophils Absolute 05/02/2022 0.1  0.0 - 0.5 K/uL Final   Basophils Relative 05/02/2022 1  % Final   Basophils Absolute 05/02/2022 0.1  0.0 - 0.1 K/uL Final   Immature Granulocytes 05/02/2022 0  % Final   Abs Immature Granulocytes 05/02/2022 0.01  0.00 - 0.07 K/uL Final   Performed at [facility], [place] [address], [place], [place] [address]   Lipase 05/02/2022 44  11 - 51 U/L Final   Performed at [place] [address], [place], [place] [address]   Troponin I (High Sensitivity) 05/02/2022 5  <18 ng/L Final   Comment: (NOTE) Elevated high sensitivity troponin I (hsTnI) values and significant  changes across serial measurements may suggest ACS but many other  chronic and acute conditions are known to  elevate hsTnI results.  Refer to the "Links" section for chest pain algorithms and additional  guidance. Performed at [facility], [place] [address], [place], [place] [address]    Ammonia 05/02/2022 20  9 - 35 umol/L Final   Performed at [place] [address], [place], [place] [address]   Glucose-Capillary 05/02/2022 86  70 - 99 mg/dL Final   Glucose reference range applies only to samples taken after fasting for at least 8 hours.   Comment 1 05/02/2022 Notify RN   Final   Comment 2 05/02/2022 Document in Chart   Final   Troponin I (High Sensitivity) 05/03/2022 4  <18 ng/L Final   Comment: (NOTE) Elevated high sensitivity troponin I (hsTnI) values and significant  changes across serial measurements may suggest ACS but many other  chronic and acute conditions  are known to elevate hsTnI results.  Refer to the "Links" section for chest pain algorithms and additional  guidance. Performed at [facility], [place] [address], [place], [place] [address]   Admission on 04/09/2022, Discharged on 04/09/2022  Component Date Value Ref Range Status   WBC 04/09/2022 4.4  4.0 - 10.5 K/uL Final   RBC 04/09/2022 4.67  4.22 - 5.81 MIL/uL Final   Hemoglobin 04/09/2022 15.5  13.0 - 17.0 g/dL Final   HCT 04/09/2022 46.1  39.0 - 52.0 % Final   MCV 04/09/2022 98.7  80.0 - 100.0 fL Final   MCH 04/09/2022 33.2  26.0 - 34.0 pg Final   MCHC 04/09/2022 33.6  30.0 - 36.0 g/dL Final   RDW 04/09/2022 12.5  11.5 - 15.5 % Final   Platelets 04/09/2022 175  150 - 400 K/uL Final   nRBC 04/09/2022 0.0  0.0 - 0.2 % Final   Neutrophils Relative % 04/09/2022 47  % Final   Neutro Abs 04/09/2022 2.0  1.7 - 7.7 K/uL Final   Lymphocytes Relative 04/09/2022 41  % Final   Lymphs Abs 04/09/2022 1.8  0.7 - 4.0 K/uL Final   Monocytes Relative 04/09/2022 9  % Final   Monocytes Absolute 04/09/2022 0.4  0.1 - 1.0 K/uL Final   Eosinophils Relative 04/09/2022 2  % Final   Eosinophils Absolute 04/09/2022 0.1  0.0 - 0.5 K/uL Final   Basophils Relative 04/09/2022 1  % Final   Basophils Absolute 04/09/2022 0.0  0.0 - 0.1 K/uL Final   Immature Granulocytes 04/09/2022 0  % Final   Abs Immature Granulocytes 04/09/2022 0.01  0.00 - 0.07 K/uL Final   Performed at [facility], [place] [address], [place], [place] [address]   Sodium 04/09/2022 143  135 - 145 mmol/L Final   Potassium 04/09/2022 3.4 (L)  3.5 - 5.1 mmol/L Final   Chloride 04/09/2022 106  98 - 111 mmol/L Final   CO2 04/09/2022 26  22 - 32 mmol/L Final   Glucose, Bld 04/09/2022 99  70 - 99 mg/dL Final   Glucose reference range applies only to samples taken after fasting for at least 8 hours.   BUN 04/09/2022 9  6 - 20 mg/dL Final   Creatinine, Ser 04/09/2022 0.70  0.61 - 1.24 mg/dL Final   Calcium 04/09/2022 9.1   8.9 - 10.3 mg/dL Final   Total Protein 04/09/2022 7.9  6.5 - 8.1 g/dL Final   Albumin 04/09/2022 4.6  3.5 - 5.0 g/dL Final   AST 04/09/2022 57 (H)  15 - 41 U/L Final   ALT 04/09/2022 56 (H)  0 - 44 U/L Final   Alkaline Phosphatase 04/09/2022 90  38 - 126 U/L Final   Total Bilirubin 04/09/2022 0.6  0.3 - 1.2 mg/dL Final   GFR, Estimated 04/09/2022 >60  >60 mL/min Final   Comment: (NOTE) Calculated using the CKD-EPI Creatinine Equation (2021)    Anion gap 04/09/2022 11  5 - 15 Final   Performed at [facility], [place] [address] [place], [place] [address]   Opiates 04/09/2022 NONE DETECTED  NONE DETECTED Final   Cocaine 04/09/2022 NONE DETECTED  NONE DETECTED Final   Benzodiazepines 04/09/2022 POSITIVE (A)  NONE DETECTED Final   Amphetamines 04/09/2022 NONE DETECTED  NONE DETECTED Final   Tetrahydrocannabinol 04/09/2022 NONE DETECTED  NONE DETECTED Final   Barbiturates 04/09/2022 NONE DETECTED  NONE DETECTED Final   Comment: (NOTE) DRUG SCREEN FOR MEDICAL PURPOSES ONLY.  IF CONFIRMATION IS NEEDED FOR ANY PURPOSE, NOTIFY LAB WITHIN 5 DAYS.  LOWEST DETECTABLE LIMITS FOR URINE DRUG SCREEN Drug Class                     Cutoff (ng/mL) Amphetamine and metabolites    1000 Barbiturate and metabolites    200 Benzodiazepine                 371 Tricyclics and metabolites     300 Opiates and metabolites        300 Cocaine and metabolites        300 THC                            50 Performed at [facility], [place] [address] [place], [place] [address]    Alcohol, Ethyl (B) 04/09/2022 289 (H)  <10 mg/dL Final   Comment: (NOTE) Lowest detectable limit for serum alcohol is 10 mg/dL.  For medical purposes only. Performed at [facility], [place] [address], [place], [place] [address]    Lipase 04/09/2022 31  11 - 51 U/L Final   Performed at [facility], [place] [address], [place], [place] [address]  Admission on  02/19/2022, Discharged on 02/20/2022  Component Date Value Ref Range Status   Sodium 02/19/2022 141  135 - 145 mmol/L Final   Potassium 02/19/2022 3.2 (L)  3.5 - 5.1 mmol/L Final   Chloride 02/19/2022 102  98 - 111 mmol/L Final   CO2 02/19/2022 30  22 - 32 mmol/L Final   Glucose, Bld 02/19/2022 111 (H)  70 - 99 mg/dL Final   Glucose reference range applies only to samples taken after fasting for at least 8 hours.   BUN 02/19/2022 7  6 - 20 mg/dL Final   Creatinine, Ser 02/19/2022 0.69  0.61 - 1.24 mg/dL Final   Calcium 02/19/2022 9.0  8.9 - 10.3 mg/dL Final   Total Protein 02/19/2022 7.6  6.5 - 8.1 g/dL Final   Albumin 02/19/2022 4.6  3.5 - 5.0 g/dL Final   AST 02/19/2022 54 (H)  15 - 41 U/L Final   ALT 02/19/2022 43  0 - 44 U/L Final   Alkaline Phosphatase 02/19/2022 88  38 - 126 U/L Final   Total Bilirubin 02/19/2022 0.6  0.3 - 1.2 mg/dL Final   GFR, Estimated 02/19/2022 >60  >60 mL/min Final   Comment: (NOTE) Calculated using the CKD-EPI Creatinine Equation (2021)    Anion gap 02/19/2022 9  5 - 15 Final   Performed at [facility]  [employer], [address] [place], [place] [address]   WBC 02/19/2022 6.8  4.0 - 10.5 K/uL Final   RBC 02/19/2022 4.81  4.22 - 5.81 MIL/uL Final   Hemoglobin 02/19/2022 16.0  13.0 - 17.0 g/dL Final   HCT 02/19/2022 47.0  39.0 - 52.0 % Final   MCV 02/19/2022 97.7  80.0 - 100.0 fL Final   MCH 02/19/2022 33.3  26.0 - 34.0 pg Final   MCHC 02/19/2022 34.0  30.0 - 36.0 g/dL Final   RDW 02/19/2022 13.5  11.5 - 15.5 % Final   Platelets 02/19/2022 242  150 - 400 K/uL Final   nRBC 02/19/2022 0.0  0.0 - 0.2 % Final   Neutrophils Relative % 02/19/2022 53  % Final   Neutro Abs 02/19/2022 3.6  1.7 - 7.7 K/uL Final   Lymphocytes Relative 02/19/2022 35  % Final   Lymphs Abs 02/19/2022 2.4  0.7 - 4.0 K/uL Final   Monocytes Relative 02/19/2022 9  % Final   Monocytes Absolute 02/19/2022 0.6  0.1 - 1.0 K/uL Final   Eosinophils Relative 02/19/2022 2  % Final    Eosinophils Absolute 02/19/2022 0.1  0.0 - 0.5 K/uL Final   Basophils Relative 02/19/2022 1  % Final   Basophils Absolute 02/19/2022 0.1  0.0 - 0.1 K/uL Final   Immature Granulocytes 02/19/2022 0  % Final   Abs Immature Granulocytes 02/19/2022 0.02  0.00 - 0.07 K/uL Final   Performed at [facility], [address] [place], [place] [address]   Alcohol, Ethyl (B) 02/19/2022 337 (HH)  <10 mg/dL Final   Comment: CRITICAL RESULT CALLED TO, READ BACK BY AND VERIFIED WITH: VALORIE POWELL,RN ON 02/19/22 AT 2259 BY JAG (NOTE) Lowest detectable limit for serum alcohol is 10 mg/dL.  For medical purposes only. Performed at [facility], [address] [place], [place] [address]    Magnesium 02/19/2022 1.9  1.7 - 2.4 mg/dL Final   Performed at [facility], [address] [place], [place] [address]   Color, Urine 02/20/2022 YELLOW  YELLOW Final   APPearance 02/20/2022 CLEAR  CLEAR Final   Specific Gravity, Urine 02/20/2022 1.010  1.005 - 1.030 Final   pH 02/20/2022 6.5  5.0 - 8.0 Final   Glucose, UA 02/20/2022 NEGATIVE  NEGATIVE mg/dL Final   Hgb urine dipstick 02/20/2022 NEGATIVE  NEGATIVE Final   Bilirubin Urine 02/20/2022 NEGATIVE  NEGATIVE Final   Ketones, ur 02/20/2022 NEGATIVE  NEGATIVE mg/dL Final   Protein, ur 02/20/2022 NEGATIVE  NEGATIVE mg/dL Final   Nitrite 02/20/2022 NEGATIVE  NEGATIVE Final   Leukocytes,Ua 02/20/2022 NEGATIVE  NEGATIVE Final   Comment: Microscopic not done on urines with negative protein, blood, leukocytes, nitrite, or glucose < 500 mg/dL. Performed at [facility], [address] [place], [place] [address]    Lipase 02/19/2022 48  11 - 51 U/L Final   Performed at [facility], [address] [place], [place] [address]    Allergies: Oxycodone hcl and Ativan [lorazepam]  PTA Medications: (Not in a hospital admission)   Medical Decision Making  Inpatient observation    Lab Orders         Resp  Panel by RT-PCR (Flu A&B, Covid) Anterior Nasal Swab         CBC with Differential/Platelet         Comprehensive metabolic panel         Hemoglobin  A1c         Ethanol         TSH         POCT Urine Drug Screen - (I-Screen)         POC SARS Coronavirus 2 Ag      Meds ordered this encounter  Medications   acetaminophen (TYLENOL) tablet 650 mg   alum & mag hydroxide-simeth (MAALOX/MYLANTA) 200-200-20 MG/5ML suspension 30 mL   magnesium hydroxide (MILK OF MAGNESIA) suspension 30 mL   thiamine (VITAMIN B1) injection 100 mg   thiamine (VITAMIN B1) tablet 100 mg   multivitamin with minerals tablet 1 tablet   dicyclomine (BENTYL) tablet 20 mg   hydrOXYzine (ATARAX) tablet 25 mg   loperamide (IMODIUM) capsule 2-4 mg   methocarbamol (ROBAXIN) tablet 500 mg   naproxen (NAPROSYN) tablet 500 mg   ondansetron (ZOFRAN-ODT) disintegrating tablet 4 mg   amLODipine (NORVASC) tablet 10 mg   DULoxetine (CYMBALTA) DR capsule 60 mg   QUEtiapine (SEROQUEL) tablet 100 mg   traZODone (DESYREL) tablet 50 mg     Recommendations  Based on my evaluation the patient appears to have an emergency medical condition for which I recommend the patient be transferred to the emergency department for further evaluation.  [name], NP 07/27/22  4:07 AM
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
## 2022-07-26 NOTE — Progress Notes (Signed)
   07/26/22 2321  [place] (Walk-ins at [facility] only)  How Did You Hear About [place]? Self (Simultaneous filing. User may not have seen previous data.)  What Is the Reason for Your Visit/Call Today? Charles Graves is a 41 year old male presenting voluntary to [facility] due to [facility] with plans to jump off bridge or run into traffic or overdose on medications with plan to lay on railroad tracks. Patient denied HI and alcohol/drug usage. Patient reported onset of SI was on yesterday. Patient reported stressors/triggers include missing my family, lack of financial and no transportation. Patient started drinking at the age of 60 and admits to drinking 1-2 pints daily. Patient was last inpatient at [facility] on 10/23. Patient is unable to contract for safety.  How Long Has This Been Causing You Problems? <Week (Simultaneous filing. User may not have seen previous data.)  Have You Recently Had Any Thoughts About Hurting Yourself? Yes (Simultaneous filing. User may not have seen previous data.)  How long ago did you have thoughts about hurting yourself? current thoughts  Are You Planning to [place] At This time? Yes (Simultaneous filing. User may not have seen previous data.)  Have you Recently Had Thoughts About [place]? No (Simultaneous filing. User may not have seen previous data.)  Are You Planning To Harm Someone At This Time? No (Simultaneous filing. User may not have seen previous data.)  Explanation: N/A  Are you currently experiencing any auditory, visual or other hallucinations? Yes  Please explain the hallucinations you are currently experiencing: "seeing a group of people talking telling me to go away and die"  Have You Used Any Alcohol or Drugs in the Past 24 Hours? No  What Did You Use and How Much? N/A  Do you have any current medical co-morbidities that require immediate attention? No  Clinician description of patient  physical appearance/behavior: casual / cooperative  What Do You Feel Would Help You the Most Today? Treatment for Depression or other mood problem;Alcohol or Drug Use Treatment (Simultaneous filing. User may not have seen previous data.)  If access to [facility] was not available, would you have sought care in the Emergency Department? Yes  Determination of Need Urgent (48 hours) (Simultaneous filing. User may not have seen previous data.)  Options For Referral Inpatient Hospitalization;Medication Management;Outpatient Therapy (Simultaneous filing. User may not have seen previous data.)

## 2022-07-27 DIAGNOSIS — R45851 Suicidal ideations: Secondary | ICD-10-CM | POA: Diagnosis not present

## 2022-07-27 DIAGNOSIS — F339 Major depressive disorder, recurrent, unspecified: Secondary | ICD-10-CM

## 2022-07-27 LAB — POC SARS CORONAVIRUS 2 AG: SARSCOV2ONAVIRUS 2 AG: NEGATIVE

## 2022-07-27 LAB — CBC WITH DIFFERENTIAL/PLATELET
Abs Immature Granulocytes: 0.02 10*3/uL (ref 0.00–0.07)
Basophils Absolute: 0.1 10*3/uL (ref 0.0–0.1)
Basophils Relative: 1 %
Eosinophils Absolute: 0.1 10*3/uL (ref 0.0–0.5)
Eosinophils Relative: 2 %
HCT: 51.9 % (ref 39.0–52.0)
Hemoglobin: 17.3 g/dL — ABNORMAL HIGH (ref 13.0–17.0)
Immature Granulocytes: 0 %
Lymphocytes Relative: 21 %
Lymphs Abs: 1.7 10*3/uL (ref 0.7–4.0)
MCH: 32 pg (ref 26.0–34.0)
MCHC: 33.3 g/dL (ref 30.0–36.0)
MCV: 95.9 fL (ref 80.0–100.0)
Monocytes Absolute: 0.6 10*3/uL (ref 0.1–1.0)
Monocytes Relative: 7 %
Neutro Abs: 5.7 10*3/uL (ref 1.7–7.7)
Neutrophils Relative %: 69 %
Platelets: 277 10*3/uL (ref 150–400)
RBC: 5.41 MIL/uL (ref 4.22–5.81)
RDW: 13.1 % (ref 11.5–15.5)
WBC: 8.2 10*3/uL (ref 4.0–10.5)
nRBC: 0 % (ref 0.0–0.2)

## 2022-07-27 LAB — COMPREHENSIVE METABOLIC PANEL
ALT: 16 U/L (ref 0–44)
AST: 18 U/L (ref 15–41)
Albumin: 4.7 g/dL (ref 3.5–5.0)
Alkaline Phosphatase: 92 U/L (ref 38–126)
Anion gap: 13 (ref 5–15)
BUN: 9 mg/dL (ref 6–20)
CO2: 26 mmol/L (ref 22–32)
Calcium: 9.9 mg/dL (ref 8.9–10.3)
Chloride: 102 mmol/L (ref 98–111)
Creatinine, Ser: 0.81 mg/dL (ref 0.61–1.24)
GFR, Estimated: >60 mL/min (ref >60)
Glucose, Bld: 96 mg/dL (ref 70–99)
Potassium: 3.7 mmol/L (ref 3.5–5.1)
Sodium: 141 mmol/L (ref 135–145)
Total Bilirubin: 0.6 mg/dL (ref 0.3–1.2)
Total Protein: 7.4 g/dL (ref 6.5–8.1)

## 2022-07-27 LAB — HEMOGLOBIN A1C
Hgb A1c MFr Bld: 5.1 % (ref 4.8–5.6)
Mean Plasma Glucose: 99.67 mg/dL

## 2022-07-27 LAB — RESP PANEL BY RT-PCR (FLU A&B, COVID) ARPGX2
Influenza A by PCR: NEGATIVE
Influenza B by PCR: NEGATIVE
SARS Coronavirus 2 by RT PCR: NEGATIVE

## 2022-07-27 LAB — POCT URINE DRUG SCREEN - MANUAL ENTRY (I-SCREEN)
POC Amphetamine UR: NOT DETECTED
POC Buprenorphine (BUP): NOT DETECTED
POC Cocaine UR: NOT DETECTED
POC Marijuana UR: NOT DETECTED
POC Methadone UR: NOT DETECTED
POC Methamphetamine UR: NOT DETECTED
POC Morphine: NOT DETECTED
POC Oxazepam (BZO): POSITIVE — AB
POC Oxycodone UR: NOT DETECTED
POC Secobarbital (BAR): NOT DETECTED

## 2022-07-27 LAB — TSH: TSH: 1.404 u[IU]/mL (ref 0.350–4.500)

## 2022-07-27 LAB — ETHANOL: Alcohol, Ethyl (B): <10 mg/dL (ref <10)

## 2022-07-27 NOTE — ED Notes (Signed)
Patient observed/assessed in room in bed appearing in no immediate distress resting peacefully. Q15 minute checks continued by MHT and nursing staff. Will continue to monitor and support. 

## 2022-07-27 NOTE — ED Provider Notes (Addendum)
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 [place], [place] [address]   Acetaminophen (Tylenol), Serum 06/27/2022 <10 (L)  10 - 30 ug/mL Final   Comment: (NOTE) Therapeutic concentrations vary significantly. A range of 10-30 ug/mL  may be an effective  concentration for many patients. However, some  are best treated at concentrations outside  of this range. Acetaminophen concentrations >150 ug/mL at 4 hours after ingestion  and >50 ug/mL at 12 hours after ingestion are often associated with  toxic reactions.  Performed at [facility], [place] [address] [place], [place] [address]    WBC 06/27/2022 6.8  4.0 - 10.5 K/uL Final   RBC 06/27/2022 4.94  4.22 - 5.81 MIL/uL Final   Hemoglobin 06/27/2022 15.9  13.0 - 17.0 g/dL Final   HCT 06/27/2022 48.0  39.0 - 52.0 % Final   MCV 06/27/2022 97.2  80.0 - 100.0 fL Final   MCH 06/27/2022 32.2  26.0 - 34.0 pg Final   MCHC 06/27/2022 33.1  30.0 - 36.0 g/dL Final   RDW 06/27/2022 13.3  11.5 - 15.5 % Final   Platelets 06/27/2022 231  150 - 400 K/uL Final   nRBC 06/27/2022 0.0  0.0 - 0.2 % Final   Performed at [facility], [place] [address], [place], [place] [address]   Opiates 06/27/2022 NONE DETECTED  NONE DETECTED Final   Cocaine 06/27/2022 NONE DETECTED  NONE DETECTED Final   Benzodiazepines 06/27/2022 NONE DETECTED  NONE DETECTED Final   Amphetamines 06/27/2022 NONE DETECTED  NONE DETECTED Final   Tetrahydrocannabinol 06/27/2022 POSITIVE (A)  NONE DETECTED Final   Barbiturates 06/27/2022 NONE DETECTED  NONE DETECTED Final   Comment: (NOTE) DRUG SCREEN FOR MEDICAL PURPOSES ONLY.  IF CONFIRMATION IS NEEDED FOR ANY PURPOSE, NOTIFY LAB WITHIN 5 DAYS.  LOWEST DETECTABLE LIMITS FOR URINE DRUG SCREEN Drug Class                     Cutoff (ng/mL) Amphetamine and metabolites    1000 Barbiturate and metabolites    200 Benzodiazepine                 200 Opiates and metabolites        300 Cocaine and metabolites        300 THC                            50 Performed at [facility], [place] [address], [place], [place] [address]    SARS Coronavirus 2 by RT PCR 06/27/2022 NEGATIVE  NEGATIVE Final   Comment: (NOTE) SARS-CoV-2 target nucleic acids  are NOT DETECTED.  The SARS-CoV-2 RNA is generally detectable in upper respiratory specimens during the acute phase of infection. The lowest concentration of SARS-CoV-2 viral copies this assay can detect is 138 copies/mL. A negative result does not preclude SARS-Cov-2 infection and should not be used as the sole basis for treatment or other patient management decisions. A negative result may occur with  improper specimen collection/handling, submission of specimen other than nasopharyngeal swab, presence of viral mutation(s) within the areas targeted by this assay, and inadequate number of viral copies(<138 copies/mL). A negative result must be combined with clinical observations, patient history, and epidemiological information. The expected result is Negative.  Fact Sheet for Patients:  EntrepreneurPulse.com.au  Fact Sheet for Healthcare Providers:  IncredibleEmployment.be  This test is no                          t yet approved or cleared by the [place] FDA and  has been authorized for detection and/or diagnosis of SARS-CoV-2 by FDA under an Emergency Use Authorization (EUA). This EUA will remain  in effect (meaning this test can be  used) for the duration of the COVID-19 declaration under Section 564(b)(1) of the Act, 21 U.S.C.section 360bbb-3(b)(1), unless the authorization is terminated  or revoked sooner.       Influenza A by PCR 06/27/2022 NEGATIVE  NEGATIVE Final   Influenza B by PCR 06/27/2022 NEGATIVE  NEGATIVE Final   Comment: (NOTE) The Xpert Xpress SARS-CoV-2/FLU/RSV plus assay is intended as an aid in the diagnosis of influenza from Nasopharyngeal swab specimens and should not be used as a sole basis for treatment. Nasal washings and aspirates are unacceptable for Xpert Xpress SARS-CoV-2/FLU/RSV testing.  Fact Sheet for Patients: EntrepreneurPulse.com.au  Fact Sheet for Healthcare  Providers: IncredibleEmployment.be  This test is not yet approved or cleared by the [place] FDA and has been authorized for detection and/or diagnosis of SARS-CoV-2 by FDA under an Emergency Use Authorization (EUA). This EUA will remain in effect (meaning this test can be used) for the duration of the COVID-19 declaration under Section 564(b)(1) of the Act, 21 U.S.C. section 360bbb-3(b)(1), unless the authorization is terminated or revoked.  Performed at [facility], [place] [address] [place], [place] [address]   Admission on 06/17/2022, Discharged on 06/17/2022  Component Date Value Ref Range Status   Alcohol, Ethyl (B) 06/17/2022 209 (H)  <10 mg/dL Final   Comment: (NOTE) Lowest detectable limit for serum alcohol is 10 mg/dL.  For medical purposes only. Performed at [facility], [place] [place], [place] [address]    Prothrombin Time 06/17/2022 12.8  11.4 - 15.2 seconds Final   INR 06/17/2022 1.0  0.8 - 1.2 Final   Comment: (NOTE) INR goal varies based on device and disease states. Performed at [facility], [place] [address] [place], [place] [address]    aPTT 06/17/2022 28  24 - 36 seconds Final   Performed at [facility], [place] [place], [place] [address]   WBC 06/17/2022 6.9  4.0 - 10.5 K/uL Final   RBC 06/17/2022 4.99  4.22 - 5.81 MIL/uL Final   Hemoglobin 06/17/2022 15.9  13.0 - 17.0 g/dL Final   HCT 06/17/2022 47.3  39.0 - 52.0 % Final   MCV 06/17/2022 94.8  80.0 - 100.0 fL Final   MCH 06/17/2022 31.9  26.0 - 34.0 pg Final   MCHC 06/17/2022 33.6  30.0 - 36.0 g/dL Final   RDW 06/17/2022 12.8  11.5 - 15.5 % Final   Platelets 06/17/2022 262  150 - 400 K/uL Final   nRBC 06/17/2022 0.0  0.0 - 0.2 % Final   Performed at [facility], [address] [place], [place] [address]   Neutrophils Relative % 06/17/2022 55  % Final   Neutro Abs 06/17/2022 3.9  1.7 - 7.7 K/uL  Final   Lymphocytes Relative 06/17/2022 33  % Final   Lymphs Abs 06/17/2022 2.3  0.7 - 4.0 K/uL Final   Monocytes Relative 06/17/2022 7  % Final   Monocytes Absolute 06/17/2022 0.5  0.1 - 1.0 K/uL Final   Eosinophils Relative 06/17/2022 4  % Final   Eosinophils Absolute 06/17/2022 0.2  0.0 - 0.5 K/uL Final   Basophils Relative 06/17/2022 1  % Final   Basophils Absolute 06/17/2022 0.1  0.0 - 0.1 K/uL Final   Immature Granulocytes 06/17/2022 0  % Final   Abs Immature Granulocytes 06/17/2022 0.01  0.00 - 0.07 K/uL Final   Performed at [facility], [place] [place],  [place] [address]   Sodium 06/17/2022 141  135 - 145 mmol/L Final   Potassium 06/17/2022 3.3 (L)  3.5 - 5.1 mmol/L Final   Chloride 06/17/2022 105  98 - 111 mmol/L Final   CO2 06/17/2022 27  22 - 32 mmol/L Final   Glucose, Bld 06/17/2022 96  70 - 99 mg/dL Final   Glucose reference range applies only to samples taken after fasting for at least 8 hours.   BUN 06/17/2022 7  6 - 20 mg/dL Final   Creatinine, Ser 06/17/2022 0.74  0.61 - 1.24 mg/dL Final   Calcium 06/17/2022 8.6 (L)  8.9 - 10.3 mg/dL Final   Total Protein 06/17/2022 7.2  6.5 - 8.1 g/dL Final   Albumin 06/17/2022 4.1  3.5 - 5.0 g/dL Final   AST 06/17/2022 28  15 - 41 U/L Final   ALT 06/17/2022 32  0 - 44 U/L Final   Alkaline Phosphatase 06/17/2022 83  38 - 126 U/L Final   Total Bilirubin 06/17/2022 0.5  0.3 - 1.2 mg/dL Final   GFR, Estimated 06/17/2022 >60  >60 mL/min Final   Comment: (NOTE) Calculated using the CKD-EPI Creatinine Equation (2021)    Anion gap 06/17/2022 9  5 - 15 Final   Performed at [facility], [place] [place], [place] [address]   Glucose-Capillary 06/17/2022 90  70 - 99 mg/dL Final   Glucose reference range applies only to samples taken after fasting for at least 8 hours.  Admission on 05/02/2022, Discharged on 05/03/2022  Component Date Value Ref Range Status   Sodium 05/02/2022 146 (H)  135 - 145 mmol/L  Final   Potassium 05/02/2022 3.9  3.5 - 5.1 mmol/L Final   Chloride 05/02/2022 106  98 - 111 mmol/L Final   CO2 05/02/2022 29  22 - 32 mmol/L Final   Glucose, Bld 05/02/2022 98  70 - 99 mg/dL Final   Glucose reference range applies only to samples taken after fasting for at least 8 hours.   BUN 05/02/2022 5 (L)  6 - 20 mg/dL Final   Creatinine, Ser 05/02/2022 0.81  0.61 - 1.24 mg/dL Final   Calcium 05/02/2022 8.9  8.9 - 10.3 mg/dL Final   Total Protein 05/02/2022 6.8  6.5 - 8.1 g/dL Final   Albumin 05/02/2022 4.2  3.5 - 5.0 g/dL Final   AST 05/02/2022 39  15 - 41 U/L Final   ALT 05/02/2022 35  0 - 44 U/L Final   Alkaline Phosphatase 05/02/2022 74  38 - 126 U/L Final   Total Bilirubin 05/02/2022 0.6  0.3 - 1.2 mg/dL Final   GFR, Estimated 05/02/2022 >60  >60 mL/min Final   Comment: (NOTE) Calculated using the CKD-EPI Creatinine Equation (2021)    Anion gap 05/02/2022 11  5 - 15 Final   Performed at [facility] [address] [place], [place] [address]   Alcohol, Ethyl (B) 05/02/2022 327 (HH)  <10 mg/dL Final   Comment: CRITICAL RESULT CALLED TO, READ BACK BY AND VERIFIED WITH K.MUNNETT, RN 0028 08.23.23 MRIVET (NOTE) Lowest detectable limit for serum alcohol is 10 mg/dL.  For medical purposes only. Performed at [facility], [place] [address], [place], [place] [address]    WBC 05/02/2022 5.5  4.0 - 10.5 K/uL Final   RBC 05/02/2022 4.81  4.22 - 5.81 MIL/uL Final   Hemoglobin 05/02/2022 15.7  13.0 - 17.0 g/dL Final   HCT 05/02/2022 47.1  39.0 - 52.0 %  Final   MCV 05/02/2022 97.9  80.0 - 100.0 fL Final   MCH 05/02/2022 32.6  26.0 - 34.0 pg Final   MCHC 05/02/2022 33.3  30.0 - 36.0 g/dL Final   RDW 05/02/2022 12.3  11.5 - 15.5 % Final   Platelets 05/02/2022 233  150 - 400 K/uL Final   nRBC 05/02/2022 0.0  0.0 - 0.2 % Final   Neutrophils Relative % 05/02/2022 49  % Final   Neutro Abs 05/02/2022 2.6  1.7 - 7.7 K/uL Final   Lymphocytes Relative 05/02/2022 42  % Final    Lymphs Abs 05/02/2022 2.3  0.7 - 4.0 K/uL Final   Monocytes Relative 05/02/2022 6  % Final   Monocytes Absolute 05/02/2022 0.3  0.1 - 1.0 K/uL Final   Eosinophils Relative 05/02/2022 2  % Final   Eosinophils Absolute 05/02/2022 0.1  0.0 - 0.5 K/uL Final   Basophils Relative 05/02/2022 1  % Final   Basophils Absolute 05/02/2022 0.1  0.0 - 0.1 K/uL Final   Immature Granulocytes 05/02/2022 0  % Final   Abs Immature Granulocytes 05/02/2022 0.01  0.00 - 0.07 K/uL Final   Performed at [facility], [place] [address], [place], [place] [address]   Lipase 05/02/2022 44  11 - 51 U/L Final   Performed at [facility] [address] [place], [place] [address]   Troponin I (High Sensitivity) 05/02/2022 5  <18 ng/L Final   Comment: (NOTE) Elevated high sensitivity troponin I (hsTnI) values and significant  changes across serial measurements may suggest ACS but many other  chronic and acute conditions are known to elevate hsTnI results.  Refer to the "Links" section for chest pain algorithms and additional  guidance. Performed at [facility], [place] [address], [place], [place] [address]    Ammonia 05/02/2022 20  9 - 35 umol/L Final   Performed at [facility] [address], [place], [place] [address]   Glucose-Capillary 05/02/2022 86  70 - 99 mg/dL Final   Glucose reference range applies only to samples taken after fasting for at least 8 hours.   Comment 1 05/02/2022 Notify RN   Final   Comment 2 05/02/2022 Document in Chart   Final   Troponin I (High Sensitivity) 05/03/2022 4  <18 ng/L Final   Comment: (NOTE) Elevated high sensitivity troponin I (hsTnI) values and significant  changes across serial measurements may suggest ACS but many other  chronic and acute conditions are known to elevate hsTnI results.  Refer to the "Links" section for chest pain algorithms and additional  guidance. Performed at [facility], [place] [address] [place], [place] [address]    Admission on 04/09/2022, Discharged on 04/09/2022  Component Date Value Ref Range Status   WBC 04/09/2022 4.4  4.0 - 10.5 K/uL Final   RBC 04/09/2022 4.67  4.22 - 5.81 MIL/uL Final   Hemoglobin 04/09/2022 15.5  13.0 - 17.0 g/dL Final   HCT 04/09/2022 46.1  39.0 - 52.0 % Final   MCV 04/09/2022 98.7  80.0 - 100.0 fL Final   MCH 04/09/2022 33.2  26.0 - 34.0 pg Final   MCHC 04/09/2022 33.6  30.0 - 36.0 g/dL Final   RDW 04/09/2022 12.5  11.5 - 15.5 % Final   Platelets 04/09/2022 175  150 - 400 K/uL Final   nRBC 04/09/2022 0.0  0.0 - 0.2 % Final   Neutrophils Relative % 04/09/2022 47  % Final   Neutro  Abs 04/09/2022 2.0  1.7 - 7.7 K/uL Final   Lymphocytes Relative 04/09/2022 41  % Final   Lymphs Abs 04/09/2022 1.8  0.7 - 4.0 K/uL Final   Monocytes Relative 04/09/2022 9  % Final   Monocytes Absolute 04/09/2022 0.4  0.1 - 1.0 K/uL Final   Eosinophils Relative 04/09/2022 2  % Final   Eosinophils Absolute 04/09/2022 0.1  0.0 - 0.5 K/uL Final   Basophils Relative 04/09/2022 1  % Final   Basophils Absolute 04/09/2022 0.0  0.0 - 0.1 K/uL Final   Immature Granulocytes 04/09/2022 0  % Final   Abs Immature Granulocytes 04/09/2022 0.01  0.00 - 0.07 K/uL Final   Performed at [facility], [place] [address], [place], [place] [address]   Sodium 04/09/2022 143  135 - 145 mmol/L Final   Potassium 04/09/2022 3.4 (L)  3.5 - 5.1 mmol/L Final   Chloride 04/09/2022 106  98 - 111 mmol/L Final   CO2 04/09/2022 26  22 - 32 mmol/L Final   Glucose, Bld 04/09/2022 99  70 - 99 mg/dL Final   Glucose reference range applies only to samples taken after fasting for at least 8 hours.   BUN 04/09/2022 9  6 - 20 mg/dL Final   Creatinine, Ser 04/09/2022 0.70  0.61 - 1.24 mg/dL Final   Calcium 04/09/2022 9.1  8.9 - 10.3 mg/dL Final   Total Protein 04/09/2022 7.9  6.5 - 8.1 g/dL Final   Albumin 04/09/2022 4.6  3.5 - 5.0 g/dL Final   AST 04/09/2022 57 (H)  15 - 41 U/L Final   ALT 04/09/2022 56 (H)  0 - 44  U/L Final   Alkaline Phosphatase 04/09/2022 90  38 - 126 U/L Final   Total Bilirubin 04/09/2022 0.6  0.3 - 1.2 mg/dL Final   GFR, Estimated 04/09/2022 >60  >60 mL/min Final   Comment: (NOTE) Calculated using the CKD-EPI Creatinine Equation (2021)    Anion gap 04/09/2022 11  5 - 15 Final   Performed at [facility], [place] [address] [place], [place] [address]   Opiates 04/09/2022 NONE DETECTED  NONE DETECTED Final   Cocaine 04/09/2022 NONE DETECTED  NONE DETECTED Final   Benzodiazepines 04/09/2022 POSITIVE (A)  NONE DETECTED Final   Amphetamines 04/09/2022 NONE DETECTED  NONE DETECTED Final   Tetrahydrocannabinol 04/09/2022 NONE DETECTED  NONE DETECTED Final   Barbiturates 04/09/2022 NONE DETECTED  NONE DETECTED Final   Comment: (NOTE) DRUG SCREEN FOR MEDICAL PURPOSES ONLY.  IF CONFIRMATION IS NEEDED FOR ANY PURPOSE, NOTIFY LAB WITHIN 5 DAYS.  LOWEST DETECTABLE LIMITS FOR URINE DRUG SCREEN Drug Class                     Cutoff (ng/mL) Amphetamine and metabolites    1000 Barbiturate and metabolites    200 Benzodiazepine                 462 Tricyclics and metabolites     300 Opiates and metabolites        300 Cocaine and metabolites        300 THC                            50 Performed at [facility], [place] [address], [place], [place] [address]    Alcohol, Ethyl (B) 04/09/2022 289 (H)  <10 mg/dL Final   Comment: (NOTE) Lowest detectable limit for serum alcohol  is 10 mg/dL.  For medical purposes only. Performed at [facility], [place] [address], [place], [place] [address]    Lipase 04/09/2022 31  11 - 51 U/L Final   Performed at [facility], [place] [address] [place], [place] [address]  Admission on 02/19/2022, Discharged on 02/20/2022  Component Date Value Ref Range Status   Sodium 02/19/2022 141  135 - 145 mmol/L Final   Potassium 02/19/2022 3.2 (L)  3.5 - 5.1 mmol/L Final   Chloride 02/19/2022 102   98 - 111 mmol/L Final   CO2 02/19/2022 30  22 - 32 mmol/L Final   Glucose, Bld 02/19/2022 111 (H)  70 - 99 mg/dL Final   Glucose reference range applies only to samples taken after fasting for at least 8 hours.   BUN 02/19/2022 7  6 - 20 mg/dL Final   Creatinine, Ser 02/19/2022 0.69  0.61 - 1.24 mg/dL Final   Calcium 02/19/2022 9.0  8.9 - 10.3 mg/dL Final   Total Protein 02/19/2022 7.6  6.5 - 8.1 g/dL Final   Albumin 02/19/2022 4.6  3.5 - 5.0 g/dL Final   AST 02/19/2022 54 (H)  15 - 41 U/L Final   ALT 02/19/2022 43  0 - 44 U/L Final   Alkaline Phosphatase 02/19/2022 88  38 - 126 U/L Final   Total Bilirubin 02/19/2022 0.6  0.3 - 1.2 mg/dL Final   GFR, Estimated 02/19/2022 >60  >60 mL/min Final   Comment: (NOTE) Calculated using the CKD-EPI Creatinine Equation (2021)    Anion gap 02/19/2022 9  5 - 15 Final   Performed at [facility], [address] [place], [place] [address]   WBC 02/19/2022 6.8  4.0 - 10.5 K/uL Final   RBC 02/19/2022 4.81  4.22 - 5.81 MIL/uL Final   Hemoglobin 02/19/2022 16.0  13.0 - 17.0 g/dL Final   HCT 02/19/2022 47.0  39.0 - 52.0 % Final   MCV 02/19/2022 97.7  80.0 - 100.0 fL Final   MCH 02/19/2022 33.3  26.0 - 34.0 pg Final   MCHC 02/19/2022 34.0  30.0 - 36.0 g/dL Final   RDW 02/19/2022 13.5  11.5 - 15.5 % Final   Platelets 02/19/2022 242  150 - 400 K/uL Final   nRBC 02/19/2022 0.0  0.0 - 0.2 % Final   Neutrophils Relative % 02/19/2022 53  % Final   Neutro Abs 02/19/2022 3.6  1.7 - 7.7 K/uL Final   Lymphocytes Relative 02/19/2022 35  % Final   Lymphs Abs 02/19/2022 2.4  0.7 - 4.0 K/uL Final   Monocytes Relative 02/19/2022 9  % Final   Monocytes Absolute 02/19/2022 0.6  0.1 - 1.0 K/uL Final   Eosinophils Relative 02/19/2022 2  % Final   Eosinophils Absolute 02/19/2022 0.1  0.0 - 0.5 K/uL Final   Basophils Relative 02/19/2022 1  % Final   Basophils Absolute 02/19/2022 0.1  0.0 - 0.1 K/uL Final   Immature Granulocytes 02/19/2022 0  % Final   Abs  Immature Granulocytes 02/19/2022 0.02  0.00 - 0.07 K/uL Final   Performed at [facility], [address] [place], [place] [address]   Alcohol, Ethyl (B) 02/19/2022 337 (HH)  <10 mg/dL Final   Comment: CRITICAL RESULT CALLED TO, READ BACK BY AND VERIFIED WITH: VALORIE POWELL,RN ON 02/19/22 AT 2259 BY JAG (NOTE) Lowest detectable limit for serum alcohol is 10 mg/dL.  For medical purposes only. Performed at [facility],  [address], [place], [place] [address]    Magnesium 02/19/2022 1.9  1.7 - 2.4 mg/dL Final   Performed at [facility], [address] [place], [place] [address]   Color, Urine 02/20/2022 YELLOW  YELLOW Final   APPearance 02/20/2022 CLEAR  CLEAR Final   Specific Gravity, Urine 02/20/2022 1.010  1.005 - 1.030 Final   pH 02/20/2022 6.5  5.0 - 8.0 Final   Glucose, UA 02/20/2022 NEGATIVE  NEGATIVE mg/dL Final   Hgb urine dipstick 02/20/2022 NEGATIVE  NEGATIVE Final   Bilirubin Urine 02/20/2022 NEGATIVE  NEGATIVE Final   Ketones, ur 02/20/2022 NEGATIVE  NEGATIVE mg/dL Final   Protein, ur 02/20/2022 NEGATIVE  NEGATIVE mg/dL Final   Nitrite 02/20/2022 NEGATIVE  NEGATIVE Final   Leukocytes,Ua 02/20/2022 NEGATIVE  NEGATIVE Final   Comment: Microscopic not done on urines with negative protein, blood, leukocytes, nitrite, or glucose < 500 mg/dL. Performed at [facility], [address] [place], [place] [address]    Lipase 02/19/2022 48  11 - 51 U/L Final   Performed at [facility], [address] [place], [place] [address]    Allergies: Oxycodone hcl and Ativan [lorazepam]  PTA Medications: (Not in a hospital admission)   Long Term Goals: Improvement in symptoms so as ready for discharge  Short Term Goals: Patient will verbalize feelings in meetings with treatment team members., Patient will attend at least of 50% of the groups daily., Pt will complete the PHQ9 on admission, day 3 and discharge., Patient will  participate in completing the [place], Patient will score a low risk of violence for 24 hours prior to discharge, and Patient will take medications as prescribed daily.  Medical Decision Making  Patient reviewed with Dr Hampton Abbot. Patient remains voluntary. Accepted to [facility] Based Crisis unit while awaiting residential substance use treatment. PHQ-9 completed on 07/27/2022 Score 25= severe depression  Labs reviewed CMP- WNL CBC- Hgb elevated 17.3 A1C- 5.1 UDS- + oxazepam Ethanol less than 10 Respiratory Panel-influenza A and B negative, COVID negative  Current medications: -Acetaminophen 650 mg every 6 as needed/mild pain -Dicyclomine 20 mg every 6 hours as needed/spasms or abdominal cramping -Maalox 30 mL oral every 4 as needed/digestion -Methocarbamol 500 mg every 8 hours as needed/muscle spasms -Naproxen 500 mg oral twice daily as needed/aching, pain or discomfort -Hydroxyzine 25 mg 3 times daily as needed/anxiety -Magnesium hydroxide 30 mL daily as needed/mild constipation -Trazodone 50 mg nightly as needed/sleep  Restarted home medications including: -Amlodipine 10 mg daily -Duloxetine DR 60 mg daily -Quetiapine 100 mg nightly  CIWA protocol initiated: -Loperamide 2 to 4 mg oral as needed/diarrhea or loose stools -Multivitamin with minerals 1 tablet daily -Ondansetron disintegrating tablet 4 mg every 6 as needed/nausea or vomiting -Thiamine tablet 100 mg daily      Recommendations  Based on my evaluation the patient does not appear to have an emergency medical condition.  Lucky Rathke, FNP 07/27/22  10:14 AM

## 2022-07-27 NOTE — ED Notes (Signed)
Patient resting with no sxs of distress noted - will continue to monitor for safety

## 2022-07-27 NOTE — ED Notes (Signed)
In a group with [place]

## 2022-07-27 NOTE — ED Notes (Signed)
Patient c/o passive SI, denies HI and AVH - patient to unit - will continue to monitor for safety

## 2022-07-27 NOTE — ED Notes (Signed)
Upon assessment, pt did state that he has a plan to harm himself. "I would lay on the railroad tracks." Denies HI, hallucinations or delusions. Pt took morning medications without incident. Pt is currently resting.

## 2022-07-27 NOTE — Progress Notes (Signed)
Received Charles Graves from [facility], he was cooperative with the admission process and oriented to his new environment. He retreated to his room and took a nap until dinner. He ate dinner and returned to his room afterwards.

## 2022-07-27 NOTE — ED Notes (Signed)
Pt arrived [facility]

## 2022-07-27 NOTE — Progress Notes (Signed)
Pt will be accepted to [facility] per Beatriz Stallion, FNP.  Benjaman Kindler, MSW, [facility] 07/27/2022 9:19 AM

## 2022-07-27 NOTE — ED Notes (Signed)
SPIRITUALITY GROUP NOTE  Spirituality group facilitated by Simone Curia, MDiv, [place].  Group Description: Group focused on topic of hope. Patients participated in facilitated discussion around topic, connecting with one another around experiences and definitions for hope. Group engaged in discussion around how their definitions of hope are present today in hospital.  Modalities: Psycho-social ed, Adlerian, Narrative, MI  Patient Progress: Charles Graves was present throughout group.  Presented with flat affect - responded to facilitator prompting, did not engage in group discussion.

## 2022-07-27 NOTE — ED Notes (Signed)
Pt refused lunch

## 2022-07-27 NOTE — ED Notes (Signed)
Pt is in the bed sleeping. Respirations are even and unlabored. No acute distress noted. Will continue to monitor for safety. 

## 2022-07-28 ENCOUNTER — Encounter (HOSPITAL_COMMUNITY): Payer: Self-pay | Admitting: Student

## 2022-07-28 DIAGNOSIS — F339 Major depressive disorder, recurrent, unspecified: Secondary | ICD-10-CM | POA: Diagnosis present

## 2022-07-28 DIAGNOSIS — Z8659 Personal history of other mental and behavioral disorders: Secondary | ICD-10-CM

## 2022-07-28 DIAGNOSIS — Z59819 Housing instability, housed unspecified: Secondary | ICD-10-CM | POA: Diagnosis present

## 2022-07-28 DIAGNOSIS — F17200 Nicotine dependence, unspecified, uncomplicated: Secondary | ICD-10-CM | POA: Diagnosis present

## 2022-07-28 DIAGNOSIS — F1020 Alcohol dependence, uncomplicated: Secondary | ICD-10-CM | POA: Diagnosis present

## 2022-07-28 DIAGNOSIS — Z720 Tobacco use: Secondary | ICD-10-CM | POA: Diagnosis present

## 2022-07-28 MED ORDER — ADULT MULTIVITAMIN W/MINERALS CH
1.0000 | ORAL_TABLET | Freq: Every day | ORAL | Status: DC
  Administered 2022-07-29 – 2022-07-31 (×3): 1 via ORAL
  Filled 2022-07-28 (×3): qty 1
  Filled 2022-07-28: qty 14

## 2022-07-28 MED ORDER — DULOXETINE HCL 60 MG PO CPEP
60.0000 mg | ORAL_CAPSULE | Freq: Every day | ORAL | Status: DC
  Administered 2022-07-29 – 2022-07-31 (×3): 60 mg via ORAL
  Filled 2022-07-28 (×2): qty 1
  Filled 2022-07-28: qty 14
  Filled 2022-07-28: qty 1

## 2022-07-28 MED ORDER — AMLODIPINE BESYLATE 10 MG PO TABS
10.0000 mg | ORAL_TABLET | Freq: Every day | ORAL | Status: DC
  Administered 2022-07-29 – 2022-07-31 (×3): 10 mg via ORAL
  Filled 2022-07-28: qty 14
  Filled 2022-07-28 (×3): qty 1

## 2022-07-28 MED ORDER — THIAMINE MONONITRATE 100 MG PO TABS
100.0000 mg | ORAL_TABLET | Freq: Every day | ORAL | Status: DC
  Administered 2022-07-29 – 2022-07-31 (×3): 100 mg via ORAL
  Filled 2022-07-28: qty 14
  Filled 2022-07-28 (×3): qty 1

## 2022-07-28 MED ORDER — NICOTINE POLACRILEX 2 MG MT GUM: 4.0000 mg | CHEWING_GUM | OROMUCOSAL | Status: DC | PRN

## 2022-07-28 MED ORDER — NICOTINE 21 MG/24HR TD PT24
21.0000 mg | MEDICATED_PATCH | Freq: Every day | TRANSDERMAL | Status: DC
  Administered 2022-07-28 – 2022-08-01 (×5): 21 mg via TRANSDERMAL
  Filled 2022-07-28 (×5): qty 1
  Filled 2022-07-28: qty 14

## 2022-07-28 NOTE — ED Notes (Signed)
Patient observed/assessed in room in bed appearing in no immediate distress resting peacefully. Q15 minute checks continued by MHT and nursing staff. Will continue to monitor and support. 

## 2022-07-28 NOTE — ED Notes (Signed)
In peers group meeting

## 2022-07-28 NOTE — ED Provider Notes (Signed)
[facility] Progress Note  Date & Time: 07/28/2022 12:46 PM Name: Charles Graves Age: 41 y.o.  DOB: [birth year]  MRN: [id number]  Diagnosis:  Final diagnoses:  Suicidal ideation  Episode of recurrent major depressive disorder, unspecified depression episode severity ([place])  Malingering    Reason for presentation: Suicidal and Addiction Problem  Brief HPI  Jacobie Stamey is a 41 y.o. male, with PMH with AUD, MDD, tobacco use d/o, inpatient psych admission [facility] Oct 2023), remote suicide attempt, who presented voluntary to [facility] (07/26/2022) via [facility] for active SI, then admitted to [facility] (07/27/2022) for assistance with residential placement for AUD.   Interval Hx   Patient Narrative:   Reported that his goals are to go to residential, then find safe housing, transportation, and job eventually.  Patient has never been to residential before.  Stated that he had been drinking about 1 pint to 1 L of bourbon near daily for the past 3-4 years.  Last drink was 11/13.  Denied craving at this time.  Denied tremors or shakes.  Stated that he does not believe his alcohol use is a problem, however wants to go to residential to help find housing.  Stated that alcohol has not impacted his job, finances, interpersonal relationships.  In fact that has helped him with his mood.  Stated that he is able to quit alcohol whenever he wants to. Stated his family is in the [place] and [place].  He traveled to [facility] for a job at [employer], that he quit in October 2023, due to "people yelling at me for no reason". Denied any other social support in [place].  Had been living with his friend for the past few months, however they have decided to move to [place].  He had been homeless for the past week.  However in general he has had housing instability for the past 1.5 years. Additional stressors include his parents taking his car away because it was under their  name. Reported that his family and siblings "don't want anything to do with me" for reasons unknown to patient.   DJ:[id number] Thoughts: No (Contracted to safety) CH:[id number] Thoughts: No OIN:[id number]: None (Denied AVH)  Mood: Depressed Sleep:Good Appetite: Fair Review of Systems  Respiratory:  Negative for shortness of breath.   Cardiovascular:  Negative for chest pain.  Gastrointestinal:  Negative for nausea and vomiting.  Neurological:  Negative for dizziness and headaches.    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 Family History: No family history on file. Social History   Substance and Sexual Activity  Alcohol Use Yes   Comment: state 1/2 pint/day    Social History   Substance and Sexual Activity  Drug Use Not Currently    Social History   Socioeconomic History   Marital status: Single    Spouse name: Not on file   Number of children: Not on file   Years of education: Not on file   Highest education level: Not on file  Occupational History   Not on file  Tobacco Use  Smoking status: Every Day    Packs/day: 0.50    Types: Cigarettes   Smokeless tobacco: Never  Vaping Use   Vaping Use: Never used  Substance and Sexual Activity   Alcohol use: Yes    Comment: state 1/2 pint/day   Drug use: Not Currently   Sexual activity: Not Currently  Other Topics Concern   Not on file  Social History Narrative   Not on file   Social Determinants of Health   Financial Resource Strain: Not on file  Food Insecurity: Food Insecurity Present (06/28/2022)   Hunger Vital Sign    Worried About Running Out of Food in the Last Year:  Sometimes true    Ran Out of Food in the Last Year: Sometimes true  Transportation Needs: Unmet Transportation Needs (06/28/2022)   PRAPARE - Hydrologist (Medical): Yes    Lack of Transportation (Non-Medical): Yes  Physical Activity: Not on file  Stress: Not on file  Social Connections: Not on file   SDOH: [place]: Food Insecurity Present (06/28/2022)  Housing: High Risk (06/28/2022)  Transportation Needs: Unmet Transportation Needs (06/28/2022)  Utilities: Not At Risk (06/28/2022)  Alcohol Screen: High Risk (06/28/2022)  Depression (PHQ2-9): High Risk (07/27/2022)  Tobacco Use: High Risk (07/28/2022)   Additional Social History: Alcohol / Drug Use Pain Medications: Unknown Prescriptions: Seroquel, Trazadone and Cymbalta Over the Counter: Unknown History of alcohol / drug use?: Yes Longest period of sobriety (when/how long): A few days Negative Consequences of Use:  (Unknown) Withdrawal Symptoms: Tremors Substance #1 Name of Substance 1: Alcohol 1 - Age of First Use: 8 1 - Amount (size/oz): "a pint or two" 1 - Frequency: Daily 1 - Last Use / Amount: 2 days ago 1 - Method of Aquiring: Self 1- Route of Use: Orally Current Medications   Current Facility-Administered Medications  Medication Dose Route Frequency Provider Last Rate Last Admin   acetaminophen (TYLENOL) tablet 650 mg  650 mg Oral Q6H PRN [name], NP       alum & mag hydroxide-simeth (MAALOX/MYLANTA) 200-200-20 MG/5ML suspension 30 mL  30 mL Oral Q4H PRN [name], NP       [START ON 07/29/2022] amLODipine (NORVASC) tablet 10 mg  10 mg Oral QHS Merrily Brittle, DO       dicyclomine (BENTYL) tablet 20 mg  20 mg Oral Q6H PRN [name], NP       [START ON 07/29/2022] DULoxetine (CYMBALTA) DR capsule 60 mg  60 mg Oral QHS Merrily Brittle, DO       hydrOXYzine (ATARAX) tablet 25 mg  25 mg Oral Q6H PRN [name], NP       loperamide (IMODIUM)  capsule 2-4 mg  2-4 mg Oral PRN [name], NP       magnesium hydroxide (MILK OF MAGNESIA) suspension 30 mL  30 mL Oral Daily PRN [name], NP       methocarbamol (ROBAXIN) tablet 500 mg  500 mg Oral Q8H PRN [name], NP       multivitamin with minerals tablet 1 tablet  1 tablet Oral Daily [name], NP   1 tablet at 07/28/22 1034   naproxen (NAPROSYN) tablet 500 mg  500 mg Oral BID PRN [name], NP       nicotine (NICODERM CQ - dosed in mg/24 hours) patch 21 mg  21 mg Transdermal Daily Merrily Brittle, DO   21 mg at 07/28/22 1245   nicotine  polacrilex (NICORETTE) gum 4 mg  4 mg Oral PRN Merrily Brittle, DO       ondansetron (ZOFRAN-ODT) disintegrating tablet 4 mg  4 mg Oral Q6H PRN [name], NP       QUEtiapine (SEROQUEL) tablet 100 mg  100 mg Oral QHS [name], NP   100 mg at 07/27/22 2117   thiamine (VITAMIN B1) tablet 100 mg  100 mg Oral Daily [name], NP   100 mg at 07/28/22 1034   traZODone (DESYREL) tablet 50 mg  50 mg Oral QHS PRN [name], NP       Current Outpatient Medications  Medication Sig Dispense Refill   amLODipine (NORVASC) 10 MG tablet Take 1 tablet (10 mg total) by mouth daily. 30 tablet 0   DULoxetine (CYMBALTA) 60 MG capsule Take 1 capsule (60 mg total) by mouth daily. 30 capsule 0   ondansetron (ZOFRAN-ODT) 4 MG disintegrating tablet Take 4 mg by mouth every 8 (eight) hours as needed for nausea or vomiting.     QUEtiapine (SEROQUEL) 100 MG tablet Take 1 tablet (100 mg total) by mouth at bedtime. 30 tablet 0    Labs / Images  Lab Results:  Admission on 07/26/2022  Component Date Value Ref Range Status   SARS Coronavirus 2 by RT PCR 07/26/2022 NEGATIVE  NEGATIVE Final   Comment: (NOTE) SARS-CoV-2 target nucleic acids are NOT DETECTED.  The SARS-CoV-2 RNA is generally detectable in upper respiratory specimens during the acute phase of infection. The lowest concentration of SARS-CoV-2 viral copies this assay can detect is 138  copies/mL. A negative result does not preclude SARS-Cov-2 infection and should not be used as the sole basis for treatment or other patient management decisions. A negative result may occur with  improper specimen collection/handling, submission of specimen other than nasopharyngeal swab, presence of viral mutation(s) within the areas targeted by this assay, and inadequate number of viral copies(<138 copies/mL). A negative result must be combined with clinical observations, patient history, and epidemiological information. The expected result is Negative.  Fact Sheet for Patients:  EntrepreneurPulse.com.au  Fact Sheet for Healthcare Providers:  IncredibleEmployment.be  This test is no                          t yet approved or cleared by the [place] FDA and  has been authorized for detection and/or diagnosis of SARS-CoV-2 by FDA under an Emergency Use Authorization (EUA). This EUA will remain  in effect (meaning this test can be used) for the duration of the COVID-19 declaration under Section 564(b)(1) of the Act, 21 U.S.C.section 360bbb-3(b)(1), unless the authorization is terminated  or revoked sooner.       Influenza A by PCR 07/26/2022 NEGATIVE  NEGATIVE Final   Influenza B by PCR 07/26/2022 NEGATIVE  NEGATIVE Final   Comment: (NOTE) The Xpert Xpress SARS-CoV-2/FLU/RSV plus assay is intended as an aid in the diagnosis of influenza from Nasopharyngeal swab specimens and should not be used as a sole basis for treatment. Nasal washings and aspirates are unacceptable for Xpert Xpress SARS-CoV-2/FLU/RSV testing.  Fact Sheet for Patients: EntrepreneurPulse.com.au  Fact Sheet for Healthcare Providers: IncredibleEmployment.be  This test is not yet approved or cleared by the [place] FDA and has been authorized for detection and/or diagnosis of SARS-CoV-2 by FDA under an Emergency Use Authorization  (EUA). This EUA will remain in effect (meaning this test can be used) for the duration of the COVID-19 declaration under  Section 564(b)(1) of the Act, 21 U.S.C. section 360bbb-3(b)(1), unless the authorization is terminated or revoked.  Performed at [facility], [place] [address] [place], [place] [address]    WBC 07/26/2022 8.2  4.0 - 10.5 K/uL Final   RBC 07/26/2022 5.41  4.22 - 5.81 MIL/uL Final   Hemoglobin 07/26/2022 17.3 (H)  13.0 - 17.0 g/dL Final   HCT 07/26/2022 51.9  39.0 - 52.0 % Final   MCV 07/26/2022 95.9  80.0 - 100.0 fL Final   MCH 07/26/2022 32.0  26.0 - 34.0 pg Final   MCHC 07/26/2022 33.3  30.0 - 36.0 g/dL Final   RDW 07/26/2022 13.1  11.5 - 15.5 % Final   Platelets 07/26/2022 277  150 - 400 K/uL Final   nRBC 07/26/2022 0.0  0.0 - 0.2 % Final   Neutrophils Relative % 07/26/2022 69  % Final   Neutro Abs 07/26/2022 5.7  1.7 - 7.7 K/uL Final   Lymphocytes Relative 07/26/2022 21  % Final   Lymphs Abs 07/26/2022 1.7  0.7 - 4.0 K/uL Final   Monocytes Relative 07/26/2022 7  % Final   Monocytes Absolute 07/26/2022 0.6  0.1 - 1.0 K/uL Final   Eosinophils Relative 07/26/2022 2  % Final   Eosinophils Absolute 07/26/2022 0.1  0.0 - 0.5 K/uL Final   Basophils Relative 07/26/2022 1  % Final   Basophils Absolute 07/26/2022 0.1  0.0 - 0.1 K/uL Final   Immature Granulocytes 07/26/2022 0  % Final   Abs Immature Granulocytes 07/26/2022 0.02  0.00 - 0.07 K/uL Final   Performed at [facility], [place] [address], [place], [place] [address]   Sodium 07/26/2022 141  135 - 145 mmol/L Final   Potassium 07/26/2022 3.7  3.5 - 5.1 mmol/L Final   Chloride 07/26/2022 102  98 - 111 mmol/L Final   CO2 07/26/2022 26  22 - 32 mmol/L Final   Glucose, Bld 07/26/2022 96  70 - 99 mg/dL Final   Glucose reference range applies only to samples taken after fasting for at least 8 hours.   BUN 07/26/2022 9  6 - 20 mg/dL Final   Creatinine, Ser 07/26/2022 0.81  0.61 - 1.24 mg/dL Final   Calcium  07/26/2022 9.9  8.9 - 10.3 mg/dL Final   Total Protein 07/26/2022 7.4  6.5 - 8.1 g/dL Final   Albumin 07/26/2022 4.7  3.5 - 5.0 g/dL Final   AST 07/26/2022 18  15 - 41 U/L Final   ALT 07/26/2022 16  0 - 44 U/L Final   Alkaline Phosphatase 07/26/2022 92  38 - 126 U/L Final   Total Bilirubin 07/26/2022 0.6  0.3 - 1.2 mg/dL Final   GFR, Estimated 07/26/2022 >60  >60 mL/min Final   Comment: (NOTE) Calculated using the CKD-EPI Creatinine Equation (2021)    Anion gap 07/26/2022 13  5 - 15 Final   Performed at [facility] [address] [place], [place] [address]   Hgb A1c MFr Bld 07/26/2022 5.1  4.8 - 5.6 % Final   Comment: (NOTE) Pre diabetes:          5.7%-6.4%  Diabetes:              >6.4%  Glycemic control for   <7.0% adults with diabetes    Mean Plasma Glucose 07/26/2022 99.67  mg/dL Final   Performed at [facility], [place] [address] [place], [place] [address]   Alcohol, Ethyl (B) 07/26/2022 <10  <  10 mg/dL Final   Comment: (NOTE) Lowest detectable limit for serum alcohol is 10 mg/dL.  For medical purposes only. Performed at [facility], [place] [address], [place], [place] [address]    POC Amphetamine UR 07/27/2022 None Detected  NONE DETECTED (Cut Off Level 1000 ng/mL) Final   POC Secobarbital (BAR) 07/27/2022 None Detected  NONE DETECTED (Cut Off Level 300 ng/mL) Final   POC Buprenorphine (BUP) 07/27/2022 None Detected  NONE DETECTED (Cut Off Level 10 ng/mL) Final   POC Oxazepam (BZO) 07/27/2022 Positive (A)  NONE DETECTED (Cut Off Level 300 ng/mL) Final   POC Cocaine UR 07/27/2022 None Detected  NONE DETECTED (Cut Off Level 300 ng/mL) Final   POC Methamphetamine UR 07/27/2022 None Detected  NONE DETECTED (Cut Off Level 1000 ng/mL) Final   POC Morphine 07/27/2022 None Detected  NONE DETECTED (Cut Off Level 300 ng/mL) Final   POC Methadone UR 07/27/2022 None Detected  NONE DETECTED (Cut Off Level 300 ng/mL) Final   POC Oxycodone UR 07/27/2022 None Detected   NONE DETECTED (Cut Off Level 100 ng/mL) Final   POC Marijuana UR 07/27/2022 None Detected  NONE DETECTED (Cut Off Level 50 ng/mL) Final   SARSCOV2ONAVIRUS 2 AG 07/27/2022 NEGATIVE  NEGATIVE Final   Comment: (NOTE) SARS-CoV-2 antigen NOT DETECTED.   Negative results are presumptive.  Negative results do not preclude SARS-CoV-2 infection and should not be used as the sole basis for treatment or other patient management decisions, including infection  control decisions, particularly in the presence of clinical signs and  symptoms consistent with COVID-19, or in those who have been in contact with the virus.  Negative results must be combined with clinical observations, patient history, and epidemiological information. The expected result is Negative.  Fact Sheet for Patients: HandmadeRecipes.com.cy  Fact Sheet for Healthcare Providers: FuneralLife.at  This test is not yet approved or cleared by the [place] FDA and  has been authorized for detection and/or diagnosis of SARS-CoV-2 by FDA under an Emergency Use Authorization (EUA).  This EUA will remain in effect (meaning this test can be used) for the duration of  the COV                          ID-19 declaration under Section 564(b)(1) of the Act, 21 U.S.C. section 360bbb-3(b)(1), unless the authorization is terminated or revoked sooner.     TSH 07/26/2022 1.404  0.350 - 4.500 uIU/mL Final   Comment: Performed by a 3rd Generation assay with a functional sensitivity of <=0.01 uIU/mL. Performed at [facility], [place] [address], [place], [place] [address]   Admission on 07/16/2022, Discharged on 07/16/2022  Component Date Value Ref Range Status   WBC 07/16/2022 7.5  4.0 - 10.5 K/uL Final   RBC 07/16/2022 4.74  4.22 - 5.81 MIL/uL Final   Hemoglobin 07/16/2022 15.3  13.0 - 17.0 g/dL Final   HCT 07/16/2022 44.5  39.0 - 52.0 % Final   MCV 07/16/2022 93.9  80.0 - 100.0 fL Final    MCH 07/16/2022 32.3  26.0 - 34.0 pg Final   MCHC 07/16/2022 34.4  30.0 - 36.0 g/dL Final   RDW 07/16/2022 13.2  11.5 - 15.5 % Final   Platelets 07/16/2022 249  150 - 400 K/uL Final   nRBC 07/16/2022 0.0  0.0 - 0.2 % Final   Neutrophils Relative % 07/16/2022 63  % Final   Neutro Abs 07/16/2022 4.7  1.7 - 7.7  K/uL Final   Lymphocytes Relative 07/16/2022 28  % Final   Lymphs Abs 07/16/2022 2.1  0.7 - 4.0 K/uL Final   Monocytes Relative 07/16/2022 7  % Final   Monocytes Absolute 07/16/2022 0.5  0.1 - 1.0 K/uL Final   Eosinophils Relative 07/16/2022 2  % Final   Eosinophils Absolute 07/16/2022 0.2  0.0 - 0.5 K/uL Final   Basophils Relative 07/16/2022 0  % Final   Basophils Absolute 07/16/2022 0.0  0.0 - 0.1 K/uL Final   Immature Granulocytes 07/16/2022 0  % Final   Abs Immature Granulocytes 07/16/2022 0.02  0.00 - 0.07 K/uL Final   Performed at [facility], [place] [address] [place], [place] [address]   Sodium 07/16/2022 140  135 - 145 mmol/L Final   Potassium 07/16/2022 3.2 (L)  3.5 - 5.1 mmol/L Final   Chloride 07/16/2022 104  98 - 111 mmol/L Final   CO2 07/16/2022 24  22 - 32 mmol/L Final   Glucose, Bld 07/16/2022 115 (H)  70 - 99 mg/dL Final   Glucose reference range applies only to samples taken after fasting for at least 8 hours.   BUN 07/16/2022 8  6 - 20 mg/dL Final   Creatinine, Ser 07/16/2022 0.76  0.61 - 1.24 mg/dL Final   Calcium 07/16/2022 8.9  8.9 - 10.3 mg/dL Final   Total Protein 07/16/2022 6.8  6.5 - 8.1 g/dL Final   Albumin 07/16/2022 4.0  3.5 - 5.0 g/dL Final   AST 07/16/2022 28  15 - 41 U/L Final   ALT 07/16/2022 18  0 - 44 U/L Final   Alkaline Phosphatase 07/16/2022 64  38 - 126 U/L Final   Total Bilirubin 07/16/2022 0.7  0.3 - 1.2 mg/dL Final   GFR, Estimated 07/16/2022 >60  >60 mL/min Final   Comment: (NOTE) Calculated using the CKD-EPI Creatinine Equation (2021)    Anion gap 07/16/2022 12  5 - 15 Final   Performed at [facility], [place] [address], [place], [place] [address]   Alcohol, Ethyl (B) 07/16/2022 178 (H)  <10 mg/dL Final   Comment: (NOTE) Lowest detectable limit for serum alcohol is 10 mg/dL.  For medical purposes only. Performed at [facility], [place] [address] [place], [place] [address]   Admission on 06/28/2022, Discharged on 07/03/2022  Component Date Value Ref Range Status   Hgb A1c MFr Bld 06/30/2022 5.3  4.8 - 5.6 % Final   Comment: (NOTE) Pre diabetes:          5.7%-6.4%  Diabetes:              >6.4%  Glycemic control for   <7.0% adults with diabetes    Mean Plasma Glucose 06/30/2022 105.41  mg/dL Final   Performed at [facility] [address], [place], [place] [address]   Cholesterol 06/30/2022 236 (H)  0 - 200 mg/dL Final   Triglycerides 06/30/2022 110  <150 mg/dL Final   HDL 06/30/2022 53  >40 mg/dL Final   Total CHOL/HDL Ratio 06/30/2022 4.5  RATIO Final   VLDL 06/30/2022 22  0 - 40 mg/dL Final   LDL Cholesterol 06/30/2022 161 (H)  0 - 99 mg/dL Final   Comment:        Total Cholesterol/HDL:CHD Risk Coronary Heart Disease Risk Table                     Men   Women  1/2 Average Risk  3.4   3.3  Average Risk       5.0   4.4  2 X Average Risk   9.6   7.1  3 X Average Risk  23.4   11.0        Use the calculated Patient Ratio above and the CHD Risk Table to determine the patient's CHD Risk.        ATP III CLASSIFICATION (LDL):  <100     mg/dL   Optimal  100-129  mg/dL   Near or Above                    Optimal  130-159  mg/dL   Borderline  160-189  mg/dL   High  >190     mg/dL   Very High Performed at [facility] [address] [place], [place] [address]    Potassium 06/30/2022 3.6  3.5 - 5.1 mmol/L Final   Performed at [facility] [address] [place], [place] [address]   Magnesium 07/02/2022 2.4  1.7 - 2.4 mg/dL Final   Performed at [facility] [address],  [place], [place] [address]   Sodium 07/02/2022 139  135 - 145 mmol/L Final   Potassium 07/02/2022 3.8  3.5 - 5.1 mmol/L Final   Chloride 07/02/2022 107  98 - 111 mmol/L Final   CO2 07/02/2022 25  22 - 32 mmol/L Final   Glucose, Bld 07/02/2022 97  70 - 99 mg/dL Final   Glucose reference range applies only to samples taken after fasting for at least 8 hours.   BUN 07/02/2022 19  6 - 20 mg/dL Final   Creatinine, Ser 07/02/2022 0.73  0.61 - 1.24 mg/dL Final   Calcium 07/02/2022 9.0  8.9 - 10.3 mg/dL Final   GFR, Estimated 07/02/2022 >60  >60 mL/min Final   Comment: (NOTE) Calculated using the CKD-EPI Creatinine Equation (2021)    Anion gap 07/02/2022 7  5 - 15 Final   Performed at [facility], [place] [address] [place], [place] [address]  Admission on 06/27/2022, Discharged on 06/28/2022  Component Date Value Ref Range Status   Sodium 06/27/2022 143  135 - 145 mmol/L Final   Potassium 06/27/2022 3.4 (L)  3.5 - 5.1 mmol/L Final   Chloride 06/27/2022 107  98 - 111 mmol/L Final   CO2 06/27/2022 26  22 - 32 mmol/L Final   Glucose, Bld 06/27/2022 96  70 - 99 mg/dL Final   Glucose reference range applies only to samples taken after fasting for at least 8 hours.   BUN 06/27/2022 6  6 - 20 mg/dL Final   Creatinine, Ser 06/27/2022 0.76  0.61 - 1.24 mg/dL Final   Calcium 06/27/2022 8.7 (L)  8.9 - 10.3 mg/dL Final   Total Protein 06/27/2022 7.5  6.5 - 8.1 g/dL Final   Albumin 06/27/2022 4.4  3.5 - 5.0 g/dL Final   AST 06/27/2022 20  15 - 41 U/L Final   ALT 06/27/2022 17  0 - 44 U/L Final   Alkaline Phosphatase 06/27/2022 82  38 - 126 U/L Final   Total Bilirubin 06/27/2022 0.5  0.3 - 1.2 mg/dL Final   GFR, Estimated 06/27/2022 >60  >60 mL/min Final   Comment: (NOTE) Calculated using the CKD-EPI Creatinine Equation (2021)    Anion gap 06/27/2022 10  5 - 15 Final   Performed at [facility], [place] [address], [place], [place] [address]  Alcohol, Ethyl (B) 06/27/2022  344 (HH)  <10 mg/dL Final   Comment: CRITICAL RESULT CALLED TO, READ BACK BY AND VERIFIED WITH GRAY,S RN @ [phone] ON 1017 BY MAHMOUD,S (NOTE) Lowest detectable limit for serum alcohol is 10 mg/dL.  For medical purposes only. Performed at [facility], [place] [address], [place], [place] [address]    Salicylate Lvl 60/63/0160 <7.0 (L)  7.0 - 30.0 mg/dL Final   Performed at [facility] [address], [place], [place] [address]   Acetaminophen (Tylenol), Serum 06/27/2022 <10 (L)  10 - 30 ug/mL Final   Comment: (NOTE) Therapeutic concentrations vary significantly. A range of 10-30 ug/mL  may be an effective concentration for many patients. However, some  are best treated at concentrations outside of this range. Acetaminophen concentrations >150 ug/mL at 4 hours after ingestion  and >50 ug/mL at 12 hours after ingestion are often associated with  toxic reactions.  Performed at [facility], [place] [address] [place], [place] [address]    WBC 06/27/2022 6.8  4.0 - 10.5 K/uL Final   RBC 06/27/2022 4.94  4.22 - 5.81 MIL/uL Final   Hemoglobin 06/27/2022 15.9  13.0 - 17.0 g/dL Final   HCT 06/27/2022 48.0  39.0 - 52.0 % Final   MCV 06/27/2022 97.2  80.0 - 100.0 fL Final   MCH 06/27/2022 32.2  26.0 - 34.0 pg Final   MCHC 06/27/2022 33.1  30.0 - 36.0 g/dL Final   RDW 06/27/2022 13.3  11.5 - 15.5 % Final   Platelets 06/27/2022 231  150 - 400 K/uL Final   nRBC 06/27/2022 0.0  0.0 - 0.2 % Final   Performed at [facility], [place] [address], [place], [place] [address]   Opiates 06/27/2022 NONE DETECTED  NONE DETECTED Final   Cocaine 06/27/2022 NONE DETECTED  NONE DETECTED Final   Benzodiazepines 06/27/2022 NONE DETECTED  NONE DETECTED Final   Amphetamines 06/27/2022 NONE DETECTED  NONE DETECTED Final   Tetrahydrocannabinol 06/27/2022 POSITIVE (A)  NONE DETECTED Final   Barbiturates 06/27/2022 NONE DETECTED  NONE DETECTED  Final   Comment: (NOTE) DRUG SCREEN FOR MEDICAL PURPOSES ONLY.  IF CONFIRMATION IS NEEDED FOR ANY PURPOSE, NOTIFY LAB WITHIN 5 DAYS.  LOWEST DETECTABLE LIMITS FOR URINE DRUG SCREEN Drug Class                     Cutoff (ng/mL) Amphetamine and metabolites    1000 Barbiturate and metabolites    200 Benzodiazepine                 200 Opiates and metabolites        300 Cocaine and metabolites        300 THC                            50 Performed at [facility], [place] [address], [place], [place] [address]    SARS Coronavirus 2 by RT PCR 06/27/2022 NEGATIVE  NEGATIVE Final   Comment: (NOTE) SARS-CoV-2 target nucleic acids are NOT DETECTED.  The SARS-CoV-2 RNA is generally detectable in upper respiratory specimens during the acute phase of infection. The lowest concentration of SARS-CoV-2 viral copies this assay can detect is 138 copies/mL. A negative result does not preclude SARS-Cov-2 infection and should not be used as the sole basis for treatment or other patient management decisions. A negative result may occur with  improper specimen collection/handling, submission of specimen other than nasopharyngeal swab, presence of viral mutation(s) within the areas targeted by this assay, and inadequate number of viral copies(<138 copies/mL). A negative result must be combined with clinical observations, patient history, and epidemiological information. The expected result is Negative.  Fact Sheet for Patients:  EntrepreneurPulse.com.au  Fact Sheet for Healthcare Providers:  IncredibleEmployment.be  This test is no                          t yet approved or cleared by the [place] FDA and  has been authorized for detection and/or diagnosis of SARS-CoV-2 by FDA under an Emergency Use Authorization (EUA). This EUA will remain  in effect (meaning this test can be used) for the duration of the COVID-19 declaration under  Section 564(b)(1) of the Act, 21 U.S.C.section 360bbb-3(b)(1), unless the authorization is terminated  or revoked sooner.       Influenza A by PCR 06/27/2022 NEGATIVE  NEGATIVE Final   Influenza B by PCR 06/27/2022 NEGATIVE  NEGATIVE Final   Comment: (NOTE) The Xpert Xpress SARS-CoV-2/FLU/RSV plus assay is intended as an aid in the diagnosis of influenza from Nasopharyngeal swab specimens and should not be used as a sole basis for treatment. Nasal washings and aspirates are unacceptable for Xpert Xpress SARS-CoV-2/FLU/RSV testing.  Fact Sheet for Patients: EntrepreneurPulse.com.au  Fact Sheet for Healthcare Providers: IncredibleEmployment.be  This test is not yet approved or cleared by the [place] FDA and has been authorized for detection and/or diagnosis of SARS-CoV-2 by FDA under an Emergency Use Authorization (EUA). This EUA will remain in effect (meaning this test can be used) for the duration of the COVID-19 declaration under Section 564(b)(1) of the Act, 21 U.S.C. section 360bbb-3(b)(1), unless the authorization is terminated or revoked.  Performed at [facility], [place] [address] [place], [place] [address]   Admission on 06/17/2022, Discharged on 06/17/2022  Component Date Value Ref Range Status   Alcohol, Ethyl (B) 06/17/2022 209 (H)  <10 mg/dL Final   Comment: (NOTE) Lowest detectable limit for serum alcohol is 10 mg/dL.  For medical purposes only. Performed at [facility], [address] [place], [place] [address]    Prothrombin Time 06/17/2022 12.8  11.4 - 15.2 seconds Final   INR 06/17/2022 1.0  0.8 - 1.2 Final   Comment: (NOTE) INR goal varies based on device and disease states. Performed at [facility], [address] [place], [place] [address]    aPTT 06/17/2022 28  24 - 36 seconds Final   Performed at [facility], [address] [place], [place]  [address]   WBC 06/17/2022 6.9  4.0 - 10.5 K/uL Final   RBC 06/17/2022 4.99  4.22 - 5.81 MIL/uL Final   Hemoglobin 06/17/2022 15.9  13.0 - 17.0 g/dL Final   HCT 06/17/2022 47.3  39.0 - 52.0 % Final   MCV 06/17/2022 94.8  80.0 - 100.0 fL Final   MCH 06/17/2022 31.9  26.0 - 34.0 pg Final   MCHC 06/17/2022 33.6  30.0 - 36.0 g/dL Final   RDW 06/17/2022 12.8  11.5 - 15.5 % Final   Platelets 06/17/2022 262  150 - 400 K/uL Final   nRBC 06/17/2022 0.0  0.0 - 0.2 % Final   Performed at [facility], [address] [place], [place] [address]   Neutrophils Relative % 06/17/2022 55  %  Final   Neutro Abs 06/17/2022 3.9  1.7 - 7.7 K/uL Final   Lymphocytes Relative 06/17/2022 33  % Final   Lymphs Abs 06/17/2022 2.3  0.7 - 4.0 K/uL Final   Monocytes Relative 06/17/2022 7  % Final   Monocytes Absolute 06/17/2022 0.5  0.1 - 1.0 K/uL Final   Eosinophils Relative 06/17/2022 4  % Final   Eosinophils Absolute 06/17/2022 0.2  0.0 - 0.5 K/uL Final   Basophils Relative 06/17/2022 1  % Final   Basophils Absolute 06/17/2022 0.1  0.0 - 0.1 K/uL Final   Immature Granulocytes 06/17/2022 0  % Final   Abs Immature Granulocytes 06/17/2022 0.01  0.00 - 0.07 K/uL Final   Performed at [facility], [address] [place], [place] [address]   Sodium 06/17/2022 141  135 - 145 mmol/L Final   Potassium 06/17/2022 3.3 (L)  3.5 - 5.1 mmol/L Final   Chloride 06/17/2022 105  98 - 111 mmol/L Final   CO2 06/17/2022 27  22 - 32 mmol/L Final   Glucose, Bld 06/17/2022 96  70 - 99 mg/dL Final   Glucose reference range applies only to samples taken after fasting for at least 8 hours.   BUN 06/17/2022 7  6 - 20 mg/dL Final   Creatinine, Ser 06/17/2022 0.74  0.61 - 1.24 mg/dL Final   Calcium 06/17/2022 8.6 (L)  8.9 - 10.3 mg/dL Final   Total Protein 06/17/2022 7.2  6.5 - 8.1 g/dL Final   Albumin 06/17/2022 4.1  3.5 - 5.0 g/dL Final   AST 06/17/2022 28  15 - 41 U/L Final   ALT 06/17/2022 32  0 - 44 U/L Final    Alkaline Phosphatase 06/17/2022 83  38 - 126 U/L Final   Total Bilirubin 06/17/2022 0.5  0.3 - 1.2 mg/dL Final   GFR, Estimated 06/17/2022 >60  >60 mL/min Final   Comment: (NOTE) Calculated using the CKD-EPI Creatinine Equation (2021)    Anion gap 06/17/2022 9  5 - 15 Final   Performed at [facility], [address] [place], [place] [address]   Glucose-Capillary 06/17/2022 90  70 - 99 mg/dL Final   Glucose reference range applies only to samples taken after fasting for at least 8 hours.  Admission on 05/02/2022, Discharged on 05/03/2022  Component Date Value Ref Range Status   Sodium 05/02/2022 146 (H)  135 - 145 mmol/L Final   Potassium 05/02/2022 3.9  3.5 - 5.1 mmol/L Final   Chloride 05/02/2022 106  98 - 111 mmol/L Final   CO2 05/02/2022 29  22 - 32 mmol/L Final   Glucose, Bld 05/02/2022 98  70 - 99 mg/dL Final   Glucose reference range applies only to samples taken after fasting for at least 8 hours.   BUN 05/02/2022 5 (L)  6 - 20 mg/dL Final   Creatinine, Ser 05/02/2022 0.81  0.61 - 1.24 mg/dL Final   Calcium 05/02/2022 8.9  8.9 - 10.3 mg/dL Final   Total Protein 05/02/2022 6.8  6.5 - 8.1 g/dL Final   Albumin 05/02/2022 4.2  3.5 - 5.0 g/dL Final   AST 05/02/2022 39  15 - 41 U/L Final   ALT 05/02/2022 35  0 - 44 U/L Final   Alkaline Phosphatase 05/02/2022 74  38 - 126 U/L Final   Total Bilirubin 05/02/2022 0.6  0.3 - 1.2 mg/dL Final   GFR, Estimated 05/02/2022 >60  >60 mL/min Final   Comment: (NOTE) Calculated using the  CKD-EPI Creatinine Equation (2021)    Anion gap 05/02/2022 11  5 - 15 Final   Performed at [facility], [place] [address], [place], [place] [address]   Alcohol, Ethyl (B) 05/02/2022 327 (HH)  <10 mg/dL Final   Comment: CRITICAL RESULT CALLED TO, READ BACK BY AND VERIFIED WITH K.MUNNETT, RN [id number] 08.23.23 MRIVET (NOTE) Lowest detectable limit for serum alcohol is 10 mg/dL.  For medical purposes only. Performed at [facility], [place] [address] [place], [place] [address]    WBC 05/02/2022 5.5  4.0 - 10.5 K/uL Final   RBC 05/02/2022 4.81  4.22 - 5.81 MIL/uL Final   Hemoglobin 05/02/2022 15.7  13.0 - 17.0 g/dL Final   HCT 05/02/2022 47.1  39.0 - 52.0 % Final   MCV 05/02/2022 97.9  80.0 - 100.0 fL Final   MCH 05/02/2022 32.6  26.0 - 34.0 pg Final   MCHC 05/02/2022 33.3  30.0 - 36.0 g/dL Final   RDW 05/02/2022 12.3  11.5 - 15.5 % Final   Platelets 05/02/2022 233  150 - 400 K/uL Final   nRBC 05/02/2022 0.0  0.0 - 0.2 % Final   Neutrophils Relative % 05/02/2022 49  % Final   Neutro Abs 05/02/2022 2.6  1.7 - 7.7 K/uL Final   Lymphocytes Relative 05/02/2022 42  % Final   Lymphs Abs 05/02/2022 2.3  0.7 - 4.0 K/uL Final   Monocytes Relative 05/02/2022 6  % Final   Monocytes Absolute 05/02/2022 0.3  0.1 - 1.0 K/uL Final   Eosinophils Relative 05/02/2022 2  % Final   Eosinophils Absolute 05/02/2022 0.1  0.0 - 0.5 K/uL Final   Basophils Relative 05/02/2022 1  % Final   Basophils Absolute 05/02/2022 0.1  0.0 - 0.1 K/uL Final   Immature Granulocytes 05/02/2022 0  % Final   Abs Immature Granulocytes 05/02/2022 0.01  0.00 - 0.07 K/uL Final   Performed at [facility], [place] [address], [place], [place] [address]   Lipase 05/02/2022 44  11 - 51 U/L Final   Performed at [place] [address] [place], [place] [address]   Troponin I (High Sensitivity) 05/02/2022 5  <18 ng/L Final   Comment: (NOTE) Elevated high sensitivity troponin I (hsTnI) values and significant  changes across serial measurements may suggest ACS but many other  chronic and acute conditions are known to elevate hsTnI results.  Refer to the "Links" section for chest pain algorithms and additional  guidance. Performed at [facility], [place] [address] [place], [place] [address]    Ammonia 05/02/2022 20  9 - 35 umol/L Final   Performed at [place] [address], [place], [place] [address]   Glucose-Capillary 05/02/2022 86  70 - 99  mg/dL Final   Glucose reference range applies only to samples taken after fasting for at least 8 hours.   Comment 1 05/02/2022 Notify RN   Final   Comment 2 05/02/2022 Document in Chart   Final   Troponin I (High Sensitivity) 05/03/2022 4  <18 ng/L Final   Comment: (NOTE) Elevated high sensitivity troponin I (hsTnI) values and significant  changes across serial measurements may suggest ACS but many other  chronic and acute conditions are known to elevate hsTnI results.  Refer to the "Links" section for chest pain algorithms and additional  guidance. Performed at [facility], [place] [address] [place], [place] [address]   Admission on 04/09/2022,  Discharged on 04/09/2022  Component Date Value Ref Range Status   WBC 04/09/2022 4.4  4.0 - 10.5 K/uL Final   RBC 04/09/2022 4.67  4.22 - 5.81 MIL/uL Final   Hemoglobin 04/09/2022 15.5  13.0 - 17.0 g/dL Final   HCT 04/09/2022 46.1  39.0 - 52.0 % Final   MCV 04/09/2022 98.7  80.0 - 100.0 fL Final   MCH 04/09/2022 33.2  26.0 - 34.0 pg Final   MCHC 04/09/2022 33.6  30.0 - 36.0 g/dL Final   RDW 04/09/2022 12.5  11.5 - 15.5 % Final   Platelets 04/09/2022 175  150 - 400 K/uL Final   nRBC 04/09/2022 0.0  0.0 - 0.2 % Final   Neutrophils Relative % 04/09/2022 47  % Final   Neutro Abs 04/09/2022 2.0  1.7 - 7.7 K/uL Final   Lymphocytes Relative 04/09/2022 41  % Final   Lymphs Abs 04/09/2022 1.8  0.7 - 4.0 K/uL Final   Monocytes Relative 04/09/2022 9  % Final   Monocytes Absolute 04/09/2022 0.4  0.1 - 1.0 K/uL Final   Eosinophils Relative 04/09/2022 2  % Final   Eosinophils Absolute 04/09/2022 0.1  0.0 - 0.5 K/uL Final   Basophils Relative 04/09/2022 1  % Final   Basophils Absolute 04/09/2022 0.0  0.0 - 0.1 K/uL Final   Immature Granulocytes 04/09/2022 0  % Final   Abs Immature Granulocytes 04/09/2022 0.01  0.00 - 0.07 K/uL Final   Performed at [facility], [place] [address] [place], [place] [address]   Sodium 04/09/2022 143   135 - 145 mmol/L Final   Potassium 04/09/2022 3.4 (L)  3.5 - 5.1 mmol/L Final   Chloride 04/09/2022 106  98 - 111 mmol/L Final   CO2 04/09/2022 26  22 - 32 mmol/L Final   Glucose, Bld 04/09/2022 99  70 - 99 mg/dL Final   Glucose reference range applies only to samples taken after fasting for at least 8 hours.   BUN 04/09/2022 9  6 - 20 mg/dL Final   Creatinine, Ser 04/09/2022 0.70  0.61 - 1.24 mg/dL Final   Calcium 04/09/2022 9.1  8.9 - 10.3 mg/dL Final   Total Protein 04/09/2022 7.9  6.5 - 8.1 g/dL Final   Albumin 04/09/2022 4.6  3.5 - 5.0 g/dL Final   AST 04/09/2022 57 (H)  15 - 41 U/L Final   ALT 04/09/2022 56 (H)  0 - 44 U/L Final   Alkaline Phosphatase 04/09/2022 90  38 - 126 U/L Final   Total Bilirubin 04/09/2022 0.6  0.3 - 1.2 mg/dL Final   GFR, Estimated 04/09/2022 >60  >60 mL/min Final   Comment: (NOTE) Calculated using the CKD-EPI Creatinine Equation (2021)    Anion gap 04/09/2022 11  5 - 15 Final   Performed at [facility], [place] [address], [place], [place] [address]   Opiates 04/09/2022 NONE DETECTED  NONE DETECTED Final   Cocaine 04/09/2022 NONE DETECTED  NONE DETECTED Final   Benzodiazepines 04/09/2022 POSITIVE (A)  NONE DETECTED Final   Amphetamines 04/09/2022 NONE DETECTED  NONE DETECTED Final   Tetrahydrocannabinol 04/09/2022 NONE DETECTED  NONE DETECTED Final   Barbiturates 04/09/2022 NONE DETECTED  NONE DETECTED Final   Comment: (NOTE) DRUG SCREEN FOR MEDICAL PURPOSES ONLY.  IF CONFIRMATION IS NEEDED FOR ANY PURPOSE, NOTIFY LAB WITHIN 5 DAYS.  LOWEST DETECTABLE LIMITS FOR URINE DRUG SCREEN Drug Class  Cutoff (ng/mL) Amphetamine and metabolites    1000 Barbiturate and metabolites    200 Benzodiazepine                 355 Tricyclics and metabolites     300 Opiates and metabolites        300 Cocaine and metabolites        300 THC                            50 Performed at [facility], [place] [address] [place], [place] [address]    Alcohol, Ethyl (B) 04/09/2022 289 (H)  <10 mg/dL Final   Comment: (NOTE) Lowest detectable limit for serum alcohol is 10 mg/dL.  For medical purposes only. Performed at [facility], [place] [address] [place], [place] [address]    Lipase 04/09/2022 31  11 - 51 U/L Final   Performed at [facility], [place] [address], [place], [place] [address]  Admission on 02/19/2022, Discharged on 02/20/2022  Component Date Value Ref Range Status   Sodium 02/19/2022 141  135 - 145 mmol/L Final   Potassium 02/19/2022 3.2 (L)  3.5 - 5.1 mmol/L Final   Chloride 02/19/2022 102  98 - 111 mmol/L Final   CO2 02/19/2022 30  22 - 32 mmol/L Final   Glucose, Bld 02/19/2022 111 (H)  70 - 99 mg/dL Final   Glucose reference range applies only to samples taken after fasting for at least 8 hours.   BUN 02/19/2022 7  6 - 20 mg/dL Final   Creatinine, Ser 02/19/2022 0.69  0.61 - 1.24 mg/dL Final   Calcium 02/19/2022 9.0  8.9 - 10.3 mg/dL Final   Total Protein 02/19/2022 7.6  6.5 - 8.1 g/dL Final   Albumin 02/19/2022 4.6  3.5 - 5.0 g/dL Final   AST 02/19/2022 54 (H)  15 - 41 U/L Final   ALT 02/19/2022 43  0 - 44 U/L Final   Alkaline Phosphatase 02/19/2022 88  38 - 126 U/L Final   Total Bilirubin 02/19/2022 0.6  0.3 - 1.2 mg/dL Final   GFR, Estimated 02/19/2022 >60  >60 mL/min Final   Comment: (NOTE) Calculated using the CKD-EPI Creatinine Equation (2021)    Anion gap 02/19/2022 9  5 - 15 Final   Performed at [facility], [address] [place], [place] [address]   WBC 02/19/2022 6.8  4.0 - 10.5 K/uL Final   RBC 02/19/2022 4.81  4.22 - 5.81 MIL/uL Final   Hemoglobin 02/19/2022 16.0  13.0 - 17.0 g/dL Final   HCT 02/19/2022 47.0  39.0 - 52.0 % Final   MCV 02/19/2022 97.7  80.0 - 100.0 fL Final   MCH 02/19/2022 33.3  26.0 - 34.0 pg Final   MCHC 02/19/2022 34.0  30.0 - 36.0 g/dL Final   RDW 02/19/2022 13.5  11.5 - 15.5 % Final    Platelets 02/19/2022 242  150 - 400 K/uL Final   nRBC 02/19/2022 0.0  0.0 - 0.2 % Final   Neutrophils Relative % 02/19/2022 53  % Final   Neutro Abs 02/19/2022 3.6  1.7 - 7.7 K/uL Final   Lymphocytes Relative 02/19/2022 35  % Final   Lymphs Abs 02/19/2022 2.4  0.7 - 4.0 K/uL Final   Monocytes Relative 02/19/2022 9  % Final   Monocytes Absolute 02/19/2022 0.6  0.1 - 1.0 K/uL Final   Eosinophils Relative 02/19/2022  2  % Final   Eosinophils Absolute 02/19/2022 0.1  0.0 - 0.5 K/uL Final   Basophils Relative 02/19/2022 1  % Final   Basophils Absolute 02/19/2022 0.1  0.0 - 0.1 K/uL Final   Immature Granulocytes 02/19/2022 0  % Final   Abs Immature Granulocytes 02/19/2022 0.02  0.00 - 0.07 K/uL Final   Performed at [facility], [address] [place], [place] [address]   Alcohol, Ethyl (B) 02/19/2022 337 (HH)  <10 mg/dL Final   Comment: CRITICAL RESULT CALLED TO, READ BACK BY AND VERIFIED WITH: VALORIE POWELL,RN ON 02/19/22 AT 2259 BY JAG (NOTE) Lowest detectable limit for serum alcohol is 10 mg/dL.  For medical purposes only. Performed at [facility], [address] [place], [place] [address]    Magnesium 02/19/2022 1.9  1.7 - 2.4 mg/dL Final   Performed at [facility], [address] [place], [place] [address]   Color, Urine 02/20/2022 YELLOW  YELLOW Final   APPearance 02/20/2022 CLEAR  CLEAR Final   Specific Gravity, Urine 02/20/2022 1.010  1.005 - 1.030 Final   pH 02/20/2022 6.5  5.0 - 8.0 Final   Glucose, UA 02/20/2022 NEGATIVE  NEGATIVE mg/dL Final   Hgb urine dipstick 02/20/2022 NEGATIVE  NEGATIVE Final   Bilirubin Urine 02/20/2022 NEGATIVE  NEGATIVE Final   Ketones, ur 02/20/2022 NEGATIVE  NEGATIVE mg/dL Final   Protein, ur 02/20/2022 NEGATIVE  NEGATIVE mg/dL Final   Nitrite 02/20/2022 NEGATIVE  NEGATIVE Final   Leukocytes,Ua 02/20/2022 NEGATIVE  NEGATIVE Final   Comment: Microscopic not done on urines with negative protein, blood,  leukocytes, nitrite, or glucose < 500 mg/dL. Performed at [facility], [address] [place], [place] [address]    Lipase 02/19/2022 48  11 - 51 U/L Final   Performed at [facility], [address] [place], [place] [address]   Blood Alcohol level:  Lab Results  Component Value Date   ETH <10 07/26/2022   ETH 178 (H) 10/93/2355   Metabolic Disorder Labs: Lab Results  Component Value Date   HGBA1C 5.1 07/26/2022   MPG 99.67 07/26/2022   MPG 105.41 06/30/2022   No results found for: "PROLACTIN" Lab Results  Component Value Date   CHOL 236 (H) 06/30/2022   TRIG 110 06/30/2022   HDL 53 06/30/2022   CHOLHDL 4.5 06/30/2022   VLDL 22 06/30/2022   LDLCALC 161 (H) 06/30/2022   Therapeutic Lab Levels: No results found for: "LITHIUM" No results found for: "VALPROATE" No results found for: "CBMZ" Physical Findings   AUDIT    Flowsheet Row Admission (Discharged) from 06/28/2022 in [facility]  Alcohol Use Disorder Identification Test Final Score (AUDIT) 40      PHQ2-9    [facility] ED from 07/26/2022 in [facility] ED from 11/18/2021 in [facility]  PHQ-2 Total Score 6 3  PHQ-9 Total Score 25 10      Flowsheet Row ED from 07/26/2022 in [facility] ED from 07/16/2022 in [facility] DEPT Admission (Discharged) from 06/28/2022 in [facility]  C-SSRS RISK CATEGORY High Risk Error: Q3, 4, or 5 should not be populated when Q2 is No High Risk       Musculoskeletal  Strength & Muscle Tone: within normal limits Gait & Station: normal Patient leans: N/A   Psychiatric Specialty  Exam   Presentation  General Appearance:Disheveled Eye Contact:Poor Speech:Clear and Coherent, Normal Rate Volume:Normal Handedness:Right  Mood and Affect   Mood:Depressed Affect:Appropriate, Blunt, Congruent  Thought Process  Thought Process:Coherent, Goal Directed Descriptions of Associations:Circumstantial  Thought Content Suicidal Thoughts:Suicidal Thoughts: No (Contracted to safety) Homicidal Thoughts:Homicidal Thoughts: No Hallucinations:Hallucinations: None (Denied AVH) Ideas of Reference:None Thought Content:WDL  Sensorium  Memory:Immediate Good Judgment:Fair Insight:Shallow  Executive Functions  Orientation:Full (Time, Place and Person) Language:Good Concentration:Good [place] of Knowledge:Good  Psychomotor Activity  Psychomotor Activity:Psychomotor Activity: Normal  Assets  Assets:Communication Skills, Desire for Improvement  Sleep  Quality:Good  Physical Exam  BP (!) 117/91 (BP Location: Left Arm)   Pulse 62   Temp 98.4 F (36.9 C) (Oral)   Resp 17   SpO2 100%  Physical Exam Vitals and nursing note reviewed.  Constitutional:      General: He is not in acute distress.    Appearance: He is not ill-appearing, toxic-appearing or diaphoretic.  HENT:     Head: Normocephalic.  Pulmonary:     Effort: Pulmonary effort is normal. No respiratory distress.  Neurological:     Mental Status: He is alert.      Assessment / Plan  Total Time spent with patient: 30 minutes Treatment Plan Summary: Daily contact with patient to assess and evaluate symptoms and progress in treatment and Medication management  Principal Problem:   Alcohol use disorder, severe, dependence ([place]) Active Problems:   MDD (major depressive disorder), severe ([place])   History of admission to inpatient psychiatry department   Tobacco use disorder   MDD (major depressive disorder), recurrent episode ([place])   Housing instability   Charles Graves is a 40 y.o. male with AUD, MDD, tobacco use d/o, inpatient psych admission [facility] Oct 2023), remote suicide attempt, who presented voluntary to [facility] (07/26/2022) via [facility] for  active SI, then admitted to [facility] (07/27/2022) for assistance with residential placement for AUD.  Patient does have an extensive ER visit for same concerns, was last seen 07/16/22.  Patient denies seeing a psychiatrist at the moment denies seeing a therapist.  Total duration of encounter: 2 days  UDS+bzo BAL negative  AUD, severe  Daily drink about 1pint to 1liter daily, for past 3-4years, with last drink was 11/13. Precontemplative stage. Poor insight into disease, does not believe that EtOH is a problem, rather it helps him to feel more happy. Amenable to residential rehab, has never been. CIWA with ativan PRN per protocol with thiamine & MV supplement Plan to discuss starting naltrexone prior to dc  MDD  Continued home cymbalta 60 mg daily Continued home seroquel 100 mg qHS  Tobacco use d/o Contemplative stage. Encouraged cessation NRTs  HTN Continued home amlodipine 10 mg daily  Considerations for follow-up: Recommend high risk screening every 6-12 months with HIV, RPR, hepatitis panel Recommend monitoring LFT every 6-12 months while on naltrexone  DISPO: IS amenable to residential rehab. Will attempt to dc patient door-to-door, however if unable will discuss with CD-IOP as bridge to rehab.  Tentative date: TBA Location: TBA   Signed: Merrily Brittle, DO Psychiatry Resident, PGY-2 07/28/2022, 12:46 PM   [facility] [place], [place] [address] Dept: [phone] Dept Fax: [phone]

## 2022-07-28 NOTE — ED Notes (Signed)
Patient observed/assessed in dining room watching TV and consuming snacks. Patient alert and oriented x4. Patient verbalizes no complaints at this time of anxiety and pain. Patient denies A/V/H. Denies Thoughts of self harm/harm towards others. Patient's appetite has improved and he states he had his last BM this morning without issue. Will continue to monitor and support.

## 2022-07-28 NOTE — Progress Notes (Signed)
Received Charles Graves this AM asleep in his room, he got up for breakfast, ate and later received his medications. He remained visible in the milieu. His affect is flat.

## 2022-07-28 NOTE — ED Notes (Signed)
Snacks given 

## 2022-07-28 NOTE — Progress Notes (Signed)
Received Charles Graves this AM asleep in his bed, he got up,received breakfast and afterwards he was compliant with his medications. He spoke with the provider, afterwards he returned to his room. He got up for lunch and returned to his room. He endorsed feeling depressed and anxiety. He denied feeing suicidal at 1255 hrs.His affect is flat without eye contact.

## 2022-07-28 NOTE — Tx Team (Signed)
LCSW met with patient to assess current mood, affect, physical state, and inquire about needs/goals while here in [facility] and after discharge. Patient reports he presented to the [facility] via police for SI with plan to jump off bridge into ongoing traffic or overdose on his sleeping medication and lay on the train tracks to kill himself. Patient reports he has been experiencing SI for the last few days, and reports it intensified on this past Tuesday due to hopelessness and depression. Patient reports he also experiences daily auditory and visual hallucinations. Patient reports auditory hallucinations of 2-3 people having a conversation in his head about how he needs to go and kill himself. Patient reports visual hallucinations of silhouettes or people which causes him nightmares and tremors. Patient reports current SI with same stated plan as before. Patient reports he has been homeless for the last month due to losing his mobile home. Patient reports he floats between a friend's home or "curling up somewhere on the streets to sleep". Patient appears to be malingering at this time. Patient denies having access to transportation stating he just loss his car, and identifies this as a trigger for him. Patient reports he has no income at this time and reports no family or friend support. Patient reports he was receiving food stamps via [facility] however was not able to re-certify. Patient reports a history of alcohol use since 1999 and reports na increase in use over the past two months. Patient reports he drinks a liter of Bourbon a day, and denies any other substance use. Patient reports his current goal is to obtain residential placement or sober living for himself. Patient reports he has no problem with working or committing to a work therapy program. Patient aware that LCSW will follow up with MD to discuss current SI/AH/VH. LCSW will also complete a facility search for placement. No other needs were reported at this  time.   Referral has been sent to [facility] for review as no insurance is being reported for the patient. Per Sharyn Lull, once reviewed she will follow up with LCSW to provide update.   LCSW will continue to follow and provide support to patient while on [facility] unit.   Lucius Conn, LCSW Clinical Social Worker [facility] Ph: [phone]

## 2022-07-28 NOTE — Progress Notes (Signed)
Charles Graves ate dinner and remained in the dayroom with his peers afterwards and watched TV.

## 2022-07-29 MED ORDER — NALTREXONE HCL 50 MG PO TABS
25.0000 mg | ORAL_TABLET | Freq: Every day | ORAL | Status: AC
  Administered 2022-07-29 – 2022-07-30 (×2): 25 mg via ORAL
  Filled 2022-07-29 (×2): qty 1

## 2022-07-29 MED ORDER — NALTREXONE HCL 50 MG PO TABS
50.0000 mg | ORAL_TABLET | Freq: Every day | ORAL | Status: DC
  Administered 2022-07-31 – 2022-08-01 (×2): 50 mg via ORAL
  Filled 2022-07-29: qty 14
  Filled 2022-07-29 (×2): qty 1

## 2022-07-29 NOTE — ED Notes (Signed)
Patient resting with no sxs of distress noted - will continue to monitor for safety

## 2022-07-29 NOTE — ED Notes (Signed)
Patient in day room watching TV - denies SI, HI and AVH at this time - will continue to monitor for safety

## 2022-07-29 NOTE — ED Provider Notes (Signed)
Behavioral Health Progress Note  Date and Time: 07/29/2022 10:28 AM Name: Charles Graves MRN:  [id number]  Subjective:  Charles Graves is a 41 y.o. male, with PMH with AUD, MDD, tobacco use d/o, inpatient psych admission [facility] Oct 2023), remote suicide attempt, who presented voluntary to [facility] (07/26/2022) via [facility] for active SI, then admitted to [facility] (07/27/2022) for assistance with residential placement for AUD.    On assessment today patient is seen in his bed, but patient sits up for exam. Patient reports he did not like what was for breakfast so he did not eat, but he did eat dinner. Patient reports that he feels his appetite is improving. Patient reports that he also feels his mood is improving. Patient reports that he has not had AVH since he was admitted. Patient denies SI and HI today as well. Patient reports that he slept well last night. Patient reports that he is having some cravings for Etoh.   Patient and provider discussed Naltrexone to address cravings. Patient endorsed that he would like to try the medication.    MD Therapy session (group): Patient attended and participated in group. Patient displayed good insight. Group discussion involved distress tolerance and coping skills. Patient was supportive of others as well in group.   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 Family History: No family history on file. Family Psychiatric  History: See H&P Social History:  Social History   Substance and Sexual Activity  Alcohol  Use Yes   Comment: state 1/2 pint/day     Social History   Substance and Sexual Activity  Drug Use Not Currently    Social History   Socioeconomic History   Marital status: Single    Spouse name: Not on file   Number of children: Not on file   Years of education: Not on file   Highest education level: Not on file  Occupational History   Not on file  Tobacco Use   Smoking status: Every Day    Packs/day: 0.50    Types: Cigarettes   Smokeless tobacco: Never  Vaping Use   Vaping Use: Never used  Substance and Sexual Activity   Alcohol use: Yes    Comment: state 1/2 pint/day   Drug use: Not Currently   Sexual activity: Not Currently  Other Topics Concern   Not on file  Social History Narrative   Not on file   Social Determinants of Health   Financial Resource Strain: Not on file  Food Insecurity: Food Insecurity Present (06/28/2022)   Hunger Vital Sign    Worried About Running Out of Food in the Last Year: Sometimes true    Ran Out of Food in the Last Year: Sometimes true  Transportation Needs: Unmet Transportation Needs (06/28/2022)   PRAPARE - Hydrologist (Medical): Yes    Lack of Transportation (Non-Medical): Yes  Physical Activity: Not on file  Stress: Not on file  Social Connections: Not on file   SDOH:  SDOH Screenings   Food Insecurity: Food Insecurity Present (06/28/2022)  Housing: High Risk (06/28/2022)  Transportation Needs: Unmet Transportation Needs (06/28/2022)  Utilities: Not At Risk (06/28/2022)  Alcohol Screen: High Risk (06/28/2022)  Depression (PHQ2-9): Medium Risk (07/28/2022)  Tobacco Use: High Risk (07/28/2022)   Additional Social History:    Pain Medications: Unknown Prescriptions: Seroquel, Trazadone and Cymbalta Over the Counter: Unknown History of alcohol / drug use?: Yes Longest period of sobriety (when/how long): A few days Negative Consequences of Use:  (Unknown) Withdrawal Symptoms: Tremors Name  of Substance 1: Alcohol 1 - Age of First Use: 8 1 - Amount (size/oz): "a pint or two" 1 - Frequency: Daily 1 - Last Use / Amount: 2 days ago 1 - Method of Aquiring: Self 1- Route of Use: Orally                  Sleep: Good  Appetite:  Fair  Current Medications:  Current Facility-Administered Medications  Medication Dose Route Frequency Provider Last Rate Last Admin   acetaminophen (TYLENOL) tablet 650 mg  650 mg Oral Q6H PRN [name], NP       alum & mag hydroxide-simeth (MAALOX/MYLANTA) 200-200-20 MG/5ML suspension 30 mL  30 mL Oral Q4H PRN [name], NP       amLODipine (NORVASC) tablet 10 mg  10 mg Oral QHS Merrily Brittle, DO       dicyclomine (BENTYL) tablet 20 mg  20 mg Oral Q6H PRN [name], NP       DULoxetine (CYMBALTA) DR capsule 60 mg  60 mg Oral QHS Merrily Brittle, DO       hydrOXYzine (ATARAX) tablet 25 mg  25 mg Oral Q6H PRN [name], NP       loperamide (IMODIUM) capsule 2-4 mg  2-4 mg Oral PRN [name], NP       magnesium hydroxide (MILK OF MAGNESIA) suspension 30 mL  30 mL Oral Daily PRN [name], NP       methocarbamol (ROBAXIN) tablet 500 mg  500 mg Oral Q8H PRN [name], NP       multivitamin with minerals tablet 1 tablet  1 tablet Oral QHS Merrily Brittle, DO       naltrexone (DEPADE) tablet 25 mg  25 mg Oral Daily Freida Busman, MD       [START ON 07/31/2022] naltrexone (DEPADE) tablet 50 mg  50 mg Oral Daily Damita Dunnings B, MD       naproxen (NAPROSYN) tablet 500 mg  500 mg Oral BID PRN [name], NP       nicotine (NICODERM CQ - dosed in mg/24 hours) patch 21 mg  21 mg Transdermal Daily Merrily Brittle, DO   21 mg at 07/29/22 0940   nicotine polacrilex (NICORETTE) gum 4 mg  4 mg Oral PRN Merrily Brittle, DO       ondansetron (ZOFRAN-ODT) disintegrating tablet 4 mg  4 mg Oral Q6H PRN [name], NP       QUEtiapine (SEROQUEL) tablet 100 mg  100 mg Oral QHS [name], NP   100 mg at 07/28/22 2123   thiamine (VITAMIN  B1) tablet 100 mg  100 mg Oral QHS Merrily Brittle, DO       traZODone (DESYREL) tablet 50 mg  50 mg Oral QHS PRN [name], NP       Current Outpatient Medications  Medication Sig Dispense Refill   amLODipine (NORVASC) 10 MG tablet Take 1 tablet (10 mg total)  by mouth daily. 30 tablet 0   DULoxetine (CYMBALTA) 60 MG capsule Take 1 capsule (60 mg total) by mouth daily. 30 capsule 0   ondansetron (ZOFRAN-ODT) 4 MG disintegrating tablet Take 4 mg by mouth every 8 (eight) hours as needed for nausea or vomiting.     QUEtiapine (SEROQUEL) 100 MG tablet Take 1 tablet (100 mg total) by mouth at bedtime. 30 tablet 0    Labs  Lab Results:  Admission on 07/26/2022  Component Date Value Ref Range Status   SARS Coronavirus 2 by RT PCR 07/26/2022 NEGATIVE  NEGATIVE Final   Comment: (NOTE) SARS-CoV-2 target nucleic acids are NOT DETECTED.  The SARS-CoV-2 RNA is generally detectable in upper respiratory specimens during the acute phase of infection. The lowest concentration of SARS-CoV-2 viral copies this assay can detect is 138 copies/mL. A negative result does not preclude SARS-Cov-2 infection and should not be used as the sole basis for treatment or other patient management decisions. A negative result may occur with  improper specimen collection/handling, submission of specimen other than nasopharyngeal swab, presence of viral mutation(s) within the areas targeted by this assay, and inadequate number of viral copies(<138 copies/mL). A negative result must be combined with clinical observations, patient history, and epidemiological information. The expected result is Negative.  Fact Sheet for Patients:  EntrepreneurPulse.com.au  Fact Sheet for Healthcare Providers:  IncredibleEmployment.be  This test is no                          t yet approved or cleared by the [place] FDA and  has been authorized for detection and/or diagnosis of SARS-CoV-2  by FDA under an Emergency Use Authorization (EUA). This EUA will remain  in effect (meaning this test can be used) for the duration of the COVID-19 declaration under Section 564(b)(1) of the Act, 21 U.S.C.section 360bbb-3(b)(1), unless the authorization is terminated  or revoked sooner.       Influenza A by PCR 07/26/2022 NEGATIVE  NEGATIVE Final   Influenza B by PCR 07/26/2022 NEGATIVE  NEGATIVE Final   Comment: (NOTE) The Xpert Xpress SARS-CoV-2/FLU/RSV plus assay is intended as an aid in the diagnosis of influenza from Nasopharyngeal swab specimens and should not be used as a sole basis for treatment. Nasal washings and aspirates are unacceptable for Xpert Xpress SARS-CoV-2/FLU/RSV testing.  Fact Sheet for Patients: EntrepreneurPulse.com.au  Fact Sheet for Healthcare Providers: IncredibleEmployment.be  This test is not yet approved or cleared by the [place] FDA and has been authorized for detection and/or diagnosis of SARS-CoV-2 by FDA under an Emergency Use Authorization (EUA). This EUA will remain in effect (meaning this test can be used) for the duration of the COVID-19 declaration under Section 564(b)(1) of the Act, 21 U.S.C. section 360bbb-3(b)(1), unless the authorization is terminated or revoked.  Performed at [facility], [place] [address], [place], [place] [address]    WBC 07/26/2022 8.2  4.0 - 10.5 K/uL Final   RBC 07/26/2022 5.41  4.22 - 5.81 MIL/uL Final   Hemoglobin 07/26/2022 17.3 (H)  13.0 - 17.0 g/dL Final   HCT 07/26/2022 51.9  39.0 - 52.0 % Final   MCV 07/26/2022 95.9  80.0 - 100.0 fL Final   MCH 07/26/2022 32.0  26.0 - 34.0 pg Final   MCHC 07/26/2022 33.3  30.0 - 36.0 g/dL Final   RDW 07/26/2022 13.1  11.5 - 15.5 % Final   Platelets 07/26/2022 277  150 - 400  K/uL Final   nRBC 07/26/2022 0.0  0.0 - 0.2 % Final   Neutrophils Relative % 07/26/2022 69  % Final   Neutro Abs 07/26/2022 5.7  1.7 - 7.7 K/uL Final    Lymphocytes Relative 07/26/2022 21  % Final   Lymphs Abs 07/26/2022 1.7  0.7 - 4.0 K/uL Final   Monocytes Relative 07/26/2022 7  % Final   Monocytes Absolute 07/26/2022 0.6  0.1 - 1.0 K/uL Final   Eosinophils Relative 07/26/2022 2  % Final   Eosinophils Absolute 07/26/2022 0.1  0.0 - 0.5 K/uL Final   Basophils Relative 07/26/2022 1  % Final   Basophils Absolute 07/26/2022 0.1  0.0 - 0.1 K/uL Final   Immature Granulocytes 07/26/2022 0  % Final   Abs Immature Granulocytes 07/26/2022 0.02  0.00 - 0.07 K/uL Final   Performed at [facility], [place] [address] [place], [place] [address]   Sodium 07/26/2022 141  135 - 145 mmol/L Final   Potassium 07/26/2022 3.7  3.5 - 5.1 mmol/L Final   Chloride 07/26/2022 102  98 - 111 mmol/L Final   CO2 07/26/2022 26  22 - 32 mmol/L Final   Glucose, Bld 07/26/2022 96  70 - 99 mg/dL Final   Glucose reference range applies only to samples taken after fasting for at least 8 hours.   BUN 07/26/2022 9  6 - 20 mg/dL Final   Creatinine, Ser 07/26/2022 0.81  0.61 - 1.24 mg/dL Final   Calcium 07/26/2022 9.9  8.9 - 10.3 mg/dL Final   Total Protein 07/26/2022 7.4  6.5 - 8.1 g/dL Final   Albumin 07/26/2022 4.7  3.5 - 5.0 g/dL Final   AST 07/26/2022 18  15 - 41 U/L Final   ALT 07/26/2022 16  0 - 44 U/L Final   Alkaline Phosphatase 07/26/2022 92  38 - 126 U/L Final   Total Bilirubin 07/26/2022 0.6  0.3 - 1.2 mg/dL Final   GFR, Estimated 07/26/2022 >60  >60 mL/min Final   Comment: (NOTE) Calculated using the CKD-EPI Creatinine Equation (2021)    Anion gap 07/26/2022 13  5 - 15 Final   Performed at [place] [address] [place], [place] [address]   Hgb A1c MFr Bld 07/26/2022 5.1  4.8 - 5.6 % Final   Comment: (NOTE) Pre diabetes:          5.7%-6.4%  Diabetes:              >6.4%  Glycemic control for   <7.0% adults with diabetes    Mean Plasma Glucose 07/26/2022 99.67  mg/dL Final   Performed at [facility], [place] [address],  [place],  [address]   Alcohol, Ethyl (B) 07/26/2022 <10  <10 mg/dL Final   Comment: (NOTE) Lowest detectable limit for serum alcohol is 10 mg/dL.  For medical purposes only. Performed at [facility], [place] [address] [place], [place] [address]    POC Amphetamine UR 07/27/2022 None Detected  NONE DETECTED (Cut Off Level 1000 ng/mL) Final   POC Secobarbital (BAR) 07/27/2022 None Detected  NONE DETECTED (Cut Off Level 300 ng/mL) Final   POC Buprenorphine (BUP) 07/27/2022 None Detected  NONE DETECTED (Cut Off Level 10 ng/mL) Final   POC Oxazepam (BZO) 07/27/2022 Positive (A)  NONE DETECTED (Cut Off Level 300 ng/mL) Final   POC Cocaine UR 07/27/2022 None Detected  NONE DETECTED (Cut Off Level 300 ng/mL) Final   POC Methamphetamine UR 07/27/2022 None Detected  NONE DETECTED (Cut Off Level 1000 ng/mL) Final   POC Morphine 07/27/2022 None Detected  NONE DETECTED (Cut Off Level 300 ng/mL) Final   POC Methadone UR 07/27/2022 None Detected  NONE DETECTED (Cut Off Level 300 ng/mL) Final   POC Oxycodone UR 07/27/2022 None Detected  NONE DETECTED (Cut Off Level 100 ng/mL) Final   POC Marijuana UR 07/27/2022 None Detected  NONE DETECTED (Cut Off Level 50 ng/mL) Final   SARSCOV2ONAVIRUS 2 AG 07/27/2022 NEGATIVE  NEGATIVE Final   Comment: (NOTE) SARS-CoV-2 antigen NOT DETECTED.   Negative results are presumptive.  Negative results do not preclude SARS-CoV-2 infection and should not be used as the sole basis for treatment or other patient management decisions, including infection  control decisions, particularly in the presence of clinical signs and  symptoms consistent with COVID-19, or in those who have been in contact with the virus.  Negative results must be combined with clinical observations, patient history, and epidemiological information. The expected result is Negative.  Fact Sheet for Patients: HandmadeRecipes.com.cy  Fact Sheet for Healthcare  Providers: FuneralLife.at  This test is not yet approved or cleared by the [place] FDA and  has been authorized for detection and/or diagnosis of SARS-CoV-2 by FDA under an Emergency Use Authorization (EUA).  This EUA will remain in effect (meaning this test can be used) for the duration of  the COV                          ID-19 declaration under Section 564(b)(1) of the Act, 21 U.S.C. section 360bbb-3(b)(1), unless the authorization is terminated or revoked sooner.     TSH 07/26/2022 1.404  0.350 - 4.500 uIU/mL Final   Comment: Performed by a 3rd Generation assay with a functional sensitivity of <=0.01 uIU/mL. Performed at [facility], [place] [address], [place], [place] [address]   Admission on 07/16/2022, Discharged on 07/16/2022  Component Date Value Ref Range Status   WBC 07/16/2022 7.5  4.0 - 10.5 K/uL Final   RBC 07/16/2022 4.74  4.22 - 5.81 MIL/uL Final   Hemoglobin 07/16/2022 15.3  13.0 - 17.0 g/dL Final   HCT 07/16/2022 44.5  39.0 - 52.0 % Final   MCV 07/16/2022 93.9  80.0 - 100.0 fL Final   MCH 07/16/2022 32.3  26.0 - 34.0 pg Final   MCHC 07/16/2022 34.4  30.0 - 36.0 g/dL Final   RDW 07/16/2022 13.2  11.5 - 15.5 % Final   Platelets 07/16/2022 249  150 - 400 K/uL Final   nRBC 07/16/2022 0.0  0.0 - 0.2 % Final   Neutrophils Relative % 07/16/2022 63  % Final   Neutro Abs 07/16/2022 4.7  1.7 - 7.7 K/uL Final   Lymphocytes Relative 07/16/2022 28  % Final   Lymphs Abs 07/16/2022 2.1  0.7 - 4.0 K/uL Final   Monocytes Relative 07/16/2022 7  % Final   Monocytes Absolute 07/16/2022 0.5  0.1 - 1.0 K/uL Final   Eosinophils Relative 07/16/2022 2  % Final   Eosinophils Absolute 07/16/2022 0.2  0.0 - 0.5 K/uL Final   Basophils Relative 07/16/2022 0  % Final   Basophils Absolute 07/16/2022 0.0  0.0 - 0.1 K/uL Final   Immature Granulocytes 07/16/2022 0  % Final   Abs Immature Granulocytes 07/16/2022 0.02  0.00 - 0.07 K/uL Final   Performed at  [facility], [place] [address] [place], [place] [address]   Sodium 07/16/2022 140  135 - 145 mmol/L Final   Potassium 07/16/2022 3.2 (L)  3.5 - 5.1 mmol/L Final   Chloride 07/16/2022 104  98 - 111 mmol/L Final   CO2 07/16/2022 24  22 - 32 mmol/L Final   Glucose, Bld 07/16/2022 115 (H)  70 - 99 mg/dL Final   Glucose reference range applies only to samples taken after fasting for at least 8 hours.   BUN 07/16/2022 8  6 - 20 mg/dL Final   Creatinine, Ser 07/16/2022 0.76  0.61 - 1.24 mg/dL Final   Calcium 07/16/2022 8.9  8.9 - 10.3 mg/dL Final   Total Protein 07/16/2022 6.8  6.5 - 8.1 g/dL Final   Albumin 07/16/2022 4.0  3.5 - 5.0 g/dL Final   AST 07/16/2022 28  15 - 41 U/L Final   ALT 07/16/2022 18  0 - 44 U/L Final   Alkaline Phosphatase 07/16/2022 64  38 - 126 U/L Final   Total Bilirubin 07/16/2022 0.7  0.3 - 1.2 mg/dL Final   GFR, Estimated 07/16/2022 >60  >60 mL/min Final   Comment: (NOTE) Calculated using the CKD-EPI Creatinine Equation (2021)    Anion gap 07/16/2022 12  5 - 15 Final   Performed at [facility], [place] [address] [place], [place] [address]   Alcohol, Ethyl (B) 07/16/2022 178 (H)  <10 mg/dL Final   Comment: (NOTE) Lowest detectable limit for serum alcohol is 10 mg/dL.  For medical purposes only. Performed at [facility], [place] [address], [place], [place] [address]   Admission on 06/28/2022, Discharged on 07/03/2022  Component Date Value Ref Range Status   Hgb A1c MFr Bld 06/30/2022 5.3  4.8 - 5.6 % Final   Comment: (NOTE) Pre diabetes:          5.7%-6.4%  Diabetes:              >6.4%  Glycemic control for   <7.0% adults with diabetes    Mean Plasma Glucose 06/30/2022 105.41  mg/dL Final   Performed at [place] [address], [place], [place] [address]   Cholesterol 06/30/2022 236 (H)  0 - 200 mg/dL Final   Triglycerides 06/30/2022 110  <150 mg/dL Final   HDL 06/30/2022 53  >40 mg/dL Final    Total CHOL/HDL Ratio 06/30/2022 4.5  RATIO Final   VLDL 06/30/2022 22  0 - 40 mg/dL Final   LDL Cholesterol 06/30/2022 161 (H)  0 - 99 mg/dL Final   Comment:        Total Cholesterol/HDL:CHD Risk Coronary Heart Disease Risk Table                     Men   Women  1/2 Average Risk   3.4   3.3  Average Risk       5.0   4.4  2 X Average Risk   9.6   7.1  3 X Average Risk  23.4   11.0        Use the calculated Patient Ratio above and the CHD Risk Table to determine the patient's CHD Risk.        ATP III CLASSIFICATION (LDL):  <100     mg/dL   Optimal  100-129  mg/dL   Near or Above                    Optimal  130-159  mg/dL   Borderline  160-189  mg/dL   High  >  190     mg/dL   Very High Performed at [place] [address] [place], [place] [address]    Potassium 06/30/2022 3.6  3.5 - 5.1 mmol/L Final   Performed at [place] [address], [place], [place] [address]   Magnesium 07/02/2022 2.4  1.7 - 2.4 mg/dL Final   Performed at [place] [address] [place], [place] [address]   Sodium 07/02/2022 139  135 - 145 mmol/L Final   Potassium 07/02/2022 3.8  3.5 - 5.1 mmol/L Final   Chloride 07/02/2022 107  98 - 111 mmol/L Final   CO2 07/02/2022 25  22 - 32 mmol/L Final   Glucose, Bld 07/02/2022 97  70 - 99 mg/dL Final   Glucose reference range applies only to samples taken after fasting for at least 8 hours.   BUN 07/02/2022 19  6 - 20 mg/dL Final   Creatinine, Ser 07/02/2022 0.73  0.61 - 1.24 mg/dL Final   Calcium 07/02/2022 9.0  8.9 - 10.3 mg/dL Final   GFR, Estimated 07/02/2022 >60  >60 mL/min Final   Comment: (NOTE) Calculated using the CKD-EPI Creatinine Equation (2021)    Anion gap 07/02/2022 7  5 - 15 Final   Performed at [facility], [place] [address], [place], [place] [address]  Admission on 06/27/2022, Discharged on 06/28/2022  Component Date Value Ref Range Status   Sodium  06/27/2022 143  135 - 145 mmol/L Final   Potassium 06/27/2022 3.4 (L)  3.5 - 5.1 mmol/L Final   Chloride 06/27/2022 107  98 - 111 mmol/L Final   CO2 06/27/2022 26  22 - 32 mmol/L Final   Glucose, Bld 06/27/2022 96  70 - 99 mg/dL Final   Glucose reference range applies only to samples taken after fasting for at least 8 hours.   BUN 06/27/2022 6  6 - 20 mg/dL Final   Creatinine, Ser 06/27/2022 0.76  0.61 - 1.24 mg/dL Final   Calcium 06/27/2022 8.7 (L)  8.9 - 10.3 mg/dL Final   Total Protein 06/27/2022 7.5  6.5 - 8.1 g/dL Final   Albumin 06/27/2022 4.4  3.5 - 5.0 g/dL Final   AST 06/27/2022 20  15 - 41 U/L Final   ALT 06/27/2022 17  0 - 44 U/L Final   Alkaline Phosphatase 06/27/2022 82  38 - 126 U/L Final   Total Bilirubin 06/27/2022 0.5  0.3 - 1.2 mg/dL Final   GFR, Estimated 06/27/2022 >60  >60 mL/min Final   Comment: (NOTE) Calculated using the CKD-EPI Creatinine Equation (2021)    Anion gap 06/27/2022 10  5 - 15 Final   Performed at [facility], [place] [address] [place], [place] [address]   Alcohol, Ethyl (B) 06/27/2022 344 (HH)  <10 mg/dL Final   Comment: CRITICAL RESULT CALLED TO, READ BACK BY AND VERIFIED WITH GRAY,S RN @ [phone] ON 1017 BY MAHMOUD,S (NOTE) Lowest detectable limit for serum alcohol is 10 mg/dL.  For medical purposes only. Performed at [facility], [place] [address] [place], [place] [address]    Salicylate Lvl 94/76/5465 <7.0 (L)  7.0 - 30.0 mg/dL Final   Performed at [place] [address], [place], [place] [address]   Acetaminophen (Tylenol), Serum 06/27/2022 <10 (L)  10 - 30 ug/mL Final   Comment: (NOTE) Therapeutic concentrations vary significantly. A range of 10-30 ug/mL  may be an effective concentration for many patients. However, some  are best treated at concentrations outside of this range. Acetaminophen concentrations >150 ug/mL at 4 hours after ingestion  and >50 ug/mL at 12 hours after  ingestion are often associated with  toxic reactions.  Performed at [facility], [place] [address], [place], [place] [address]    WBC 06/27/2022 6.8  4.0 - 10.5 K/uL Final   RBC 06/27/2022 4.94  4.22 - 5.81 MIL/uL Final   Hemoglobin 06/27/2022 15.9  13.0 - 17.0 g/dL Final   HCT 06/27/2022 48.0  39.0 - 52.0 % Final   MCV 06/27/2022 97.2  80.0 - 100.0 fL Final   MCH 06/27/2022 32.2  26.0 - 34.0 pg Final   MCHC 06/27/2022 33.1  30.0 - 36.0 g/dL Final   RDW 06/27/2022 13.3  11.5 - 15.5 % Final   Platelets 06/27/2022 231  150 - 400 K/uL Final   nRBC 06/27/2022 0.0  0.0 - 0.2 % Final   Performed at [facility], [place] [address] [place], [place] [address]   Opiates 06/27/2022 NONE DETECTED  NONE DETECTED Final   Cocaine 06/27/2022 NONE DETECTED  NONE DETECTED Final   Benzodiazepines 06/27/2022 NONE DETECTED  NONE DETECTED Final   Amphetamines 06/27/2022 NONE DETECTED  NONE DETECTED Final   Tetrahydrocannabinol 06/27/2022 POSITIVE (A)  NONE DETECTED Final   Barbiturates 06/27/2022 NONE DETECTED  NONE DETECTED Final   Comment: (NOTE) DRUG SCREEN FOR MEDICAL PURPOSES ONLY.  IF CONFIRMATION IS NEEDED FOR ANY PURPOSE, NOTIFY LAB WITHIN 5 DAYS.  LOWEST DETECTABLE LIMITS FOR URINE DRUG SCREEN Drug Class                     Cutoff (ng/mL) Amphetamine and metabolites    1000 Barbiturate and metabolites    200 Benzodiazepine                 200 Opiates and metabolites        300 Cocaine and metabolites        300 THC                            50 Performed at [facility], [place] [address] [place], [place] [address]    SARS Coronavirus 2 by RT PCR 06/27/2022 NEGATIVE  NEGATIVE Final   Comment: (NOTE) SARS-CoV-2 target nucleic acids are NOT DETECTED.  The SARS-CoV-2 RNA is generally detectable in upper respiratory specimens during the acute phase of infection. The lowest concentration of SARS-CoV-2 viral copies this assay can detect  is 138 copies/mL. A negative result does not preclude SARS-Cov-2 infection and should not be used as the sole basis for treatment or other patient management decisions. A negative result may occur with  improper specimen collection/handling, submission of specimen other than nasopharyngeal swab, presence of viral mutation(s) within the areas targeted by this assay, and inadequate number of viral copies(<138 copies/mL). A negative result must be combined with clinical observations, patient history, and epidemiological information. The expected result is Negative.  Fact Sheet for Patients:  EntrepreneurPulse.com.au  Fact Sheet for Healthcare Providers:  IncredibleEmployment.be  This test is no                          t yet approved or cleared by the [place] FDA and  has been authorized for detection and/or diagnosis of SARS-CoV-2 by FDA under an Emergency Use Authorization (EUA). This EUA will remain  in effect (meaning this test can be used) for the duration of the COVID-19 declaration under Section 564(b)(1) of the Act, 21 U.S.C.section 360bbb-3(b)(1), unless the authorization is terminated  or revoked sooner.       Influenza A by PCR 06/27/2022 NEGATIVE  NEGATIVE Final   Influenza B by PCR 06/27/2022 NEGATIVE  NEGATIVE Final   Comment: (NOTE) The Xpert Xpress SARS-CoV-2/FLU/RSV plus assay is intended as an aid in the diagnosis of influenza from Nasopharyngeal swab specimens and should not be used as a sole basis for treatment. Nasal washings and aspirates are unacceptable for Xpert Xpress SARS-CoV-2/FLU/RSV testing.  Fact Sheet for Patients: EntrepreneurPulse.com.au  Fact Sheet for Healthcare Providers: IncredibleEmployment.be  This test is not yet approved or cleared by the [place] FDA and has been authorized for detection and/or diagnosis of SARS-CoV-2 by FDA under an Emergency Use  Authorization (EUA). This EUA will remain in effect (meaning this test can be used) for the duration of the COVID-19 declaration under Section 564(b)(1) of the Act, 21 U.S.C. section 360bbb-3(b)(1), unless the authorization is terminated or revoked.  Performed at [facility], [place] [address], [place], [place] [address]   Admission on 06/17/2022, Discharged on 06/17/2022  Component Date Value Ref Range Status   Alcohol, Ethyl (B) 06/17/2022 209 (H)  <10 mg/dL Final   Comment: (NOTE) Lowest detectable limit for serum alcohol is 10 mg/dL.  For medical purposes only. Performed at [facility], [address] [place], [place] [address]    Prothrombin Time 06/17/2022 12.8  11.4 - 15.2 seconds Final   INR 06/17/2022 1.0  0.8 - 1.2 Final   Comment: (NOTE) INR goal varies based on device and disease states. Performed at [facility], [address] [place], [place] [address]    aPTT 06/17/2022 28  24 - 36 seconds Final   Performed at [facility], [address] [place], [place] [address]   WBC 06/17/2022 6.9  4.0 - 10.5 K/uL Final   RBC 06/17/2022 4.99  4.22 - 5.81 MIL/uL Final   Hemoglobin 06/17/2022 15.9  13.0 - 17.0 g/dL Final   HCT 06/17/2022 47.3  39.0 - 52.0 % Final   MCV 06/17/2022 94.8  80.0 - 100.0 fL Final   MCH 06/17/2022 31.9  26.0 - 34.0 pg Final   MCHC 06/17/2022 33.6  30.0 - 36.0 g/dL Final   RDW 06/17/2022 12.8  11.5 - 15.5 % Final   Platelets 06/17/2022 262  150 - 400 K/uL Final   nRBC 06/17/2022 0.0  0.0 - 0.2 % Final   Performed at [facility], [address] [place], [place] [address]   Neutrophils Relative % 06/17/2022 55  % Final   Neutro Abs 06/17/2022 3.9  1.7 - 7.7 K/uL Final   Lymphocytes Relative 06/17/2022 33  % Final   Lymphs Abs 06/17/2022 2.3  0.7 - 4.0 K/uL Final   Monocytes Relative 06/17/2022 7  % Final   Monocytes Absolute 06/17/2022 0.5  0.1 - 1.0 K/uL Final   Eosinophils  Relative 06/17/2022 4  % Final   Eosinophils Absolute 06/17/2022 0.2  0.0 - 0.5 K/uL Final   Basophils Relative 06/17/2022 1  % Final   Basophils Absolute 06/17/2022 0.1  0.0 - 0.1 K/uL Final   Immature Granulocytes 06/17/2022 0  % Final   Abs Immature Granulocytes 06/17/2022 0.01  0.00 - 0.07 K/uL Final   Performed at [facility]  [address], [address] [place], [place] [address]   Sodium 06/17/2022 141  135 - 145 mmol/L Final   Potassium 06/17/2022 3.3 (L)  3.5 - 5.1 mmol/L Final   Chloride 06/17/2022 105  98 - 111 mmol/L Final   CO2 06/17/2022 27  22 - 32 mmol/L Final   Glucose, Bld 06/17/2022 96  70 - 99 mg/dL Final   Glucose reference range applies only to samples taken after fasting for at least 8 hours.   BUN 06/17/2022 7  6 - 20 mg/dL Final   Creatinine, Ser 06/17/2022 0.74  0.61 - 1.24 mg/dL Final   Calcium 06/17/2022 8.6 (L)  8.9 - 10.3 mg/dL Final   Total Protein 06/17/2022 7.2  6.5 - 8.1 g/dL Final   Albumin 06/17/2022 4.1  3.5 - 5.0 g/dL Final   AST 06/17/2022 28  15 - 41 U/L Final   ALT 06/17/2022 32  0 - 44 U/L Final   Alkaline Phosphatase 06/17/2022 83  38 - 126 U/L Final   Total Bilirubin 06/17/2022 0.5  0.3 - 1.2 mg/dL Final   GFR, Estimated 06/17/2022 >60  >60 mL/min Final   Comment: (NOTE) Calculated using the CKD-EPI Creatinine Equation (2021)    Anion gap 06/17/2022 9  5 - 15 Final   Performed at [facility], [address] [place], [place] [address]   Glucose-Capillary 06/17/2022 90  70 - 99 mg/dL Final   Glucose reference range applies only to samples taken after fasting for at least 8 hours.  Admission on 05/02/2022, Discharged on 05/03/2022  Component Date Value Ref Range Status   Sodium 05/02/2022 146 (H)  135 - 145 mmol/L Final   Potassium 05/02/2022 3.9  3.5 - 5.1 mmol/L Final   Chloride 05/02/2022 106  98 - 111 mmol/L Final   CO2 05/02/2022 29  22 - 32 mmol/L Final   Glucose, Bld 05/02/2022 98  70 - 99 mg/dL Final   Glucose  reference range applies only to samples taken after fasting for at least 8 hours.   BUN 05/02/2022 5 (L)  6 - 20 mg/dL Final   Creatinine, Ser 05/02/2022 0.81  0.61 - 1.24 mg/dL Final   Calcium 05/02/2022 8.9  8.9 - 10.3 mg/dL Final   Total Protein 05/02/2022 6.8  6.5 - 8.1 g/dL Final   Albumin 05/02/2022 4.2  3.5 - 5.0 g/dL Final   AST 05/02/2022 39  15 - 41 U/L Final   ALT 05/02/2022 35  0 - 44 U/L Final   Alkaline Phosphatase 05/02/2022 74  38 - 126 U/L Final   Total Bilirubin 05/02/2022 0.6  0.3 - 1.2 mg/dL Final   GFR, Estimated 05/02/2022 >60  >60 mL/min Final   Comment: (NOTE) Calculated using the CKD-EPI Creatinine Equation (2021)    Anion gap 05/02/2022 11  5 - 15 Final   Performed at [facility] [place] [address] [place], [place] [address]   Alcohol, Ethyl (B) 05/02/2022 327 (HH)  <10 mg/dL Final   Comment: CRITICAL RESULT CALLED TO, READ BACK BY AND VERIFIED WITH K.MUNNETT, RN [id number] 08.23.23 MRIVET (NOTE) Lowest detectable limit for serum alcohol is 10 mg/dL.  For medical purposes only. Performed at [facility], [place] [address], [place], [place] [address]    WBC 05/02/2022 5.5  4.0 - 10.5 K/uL Final   RBC 05/02/2022 4.81  4.22 - 5.81 MIL/uL Final   Hemoglobin 05/02/2022 15.7  13.0 - 17.0 g/dL Final   HCT  05/02/2022 47.1  39.0 - 52.0 % Final   MCV 05/02/2022 97.9  80.0 - 100.0 fL Final   MCH 05/02/2022 32.6  26.0 - 34.0 pg Final   MCHC 05/02/2022 33.3  30.0 - 36.0 g/dL Final   RDW 05/02/2022 12.3  11.5 - 15.5 % Final   Platelets 05/02/2022 233  150 - 400 K/uL Final   nRBC 05/02/2022 0.0  0.0 - 0.2 % Final   Neutrophils Relative % 05/02/2022 49  % Final   Neutro Abs 05/02/2022 2.6  1.7 - 7.7 K/uL Final   Lymphocytes Relative 05/02/2022 42  % Final   Lymphs Abs 05/02/2022 2.3  0.7 - 4.0 K/uL Final   Monocytes Relative 05/02/2022 6  % Final   Monocytes Absolute 05/02/2022 0.3  0.1 - 1.0 K/uL Final   Eosinophils Relative 05/02/2022 2  % Final   Eosinophils  Absolute 05/02/2022 0.1  0.0 - 0.5 K/uL Final   Basophils Relative 05/02/2022 1  % Final   Basophils Absolute 05/02/2022 0.1  0.0 - 0.1 K/uL Final   Immature Granulocytes 05/02/2022 0  % Final   Abs Immature Granulocytes 05/02/2022 0.01  0.00 - 0.07 K/uL Final   Performed at [facility], [place] [address], [place], [place] [address]   Lipase 05/02/2022 44  11 - 51 U/L Final   Performed at [place] [address] [place], [place] [address]   Troponin I (High Sensitivity) 05/02/2022 5  <18 ng/L Final   Comment: (NOTE) Elevated high sensitivity troponin I (hsTnI) values and significant  changes across serial measurements may suggest ACS but many other  chronic and acute conditions are known to elevate hsTnI results.  Refer to the "Links" section for chest pain algorithms and additional  guidance. Performed at [facility], [place] [address] [place], [place] [address]    Ammonia 05/02/2022 20  9 - 35 umol/L Final   Performed at [place] [address] [place], [place] [address]   Glucose-Capillary 05/02/2022 86  70 - 99 mg/dL Final   Glucose reference range applies only to samples taken after fasting for at least 8 hours.   Comment 1 05/02/2022 Notify RN   Final   Comment 2 05/02/2022 Document in Chart   Final   Troponin I (High Sensitivity) 05/03/2022 4  <18 ng/L Final   Comment: (NOTE) Elevated high sensitivity troponin I (hsTnI) values and significant  changes across serial measurements may suggest ACS but many other  chronic and acute conditions are known to elevate hsTnI results.  Refer to the "Links" section for chest pain algorithms and additional  guidance. Performed at [facility], [place] [address] [place], [place] [address]   Admission on 04/09/2022, Discharged on 04/09/2022  Component Date Value Ref Range Status   WBC 04/09/2022 4.4  4.0 - 10.5 K/uL Final   RBC 04/09/2022 4.67  4.22 - 5.81 MIL/uL Final   Hemoglobin 04/09/2022 15.5   13.0 - 17.0 g/dL Final   HCT 04/09/2022 46.1  39.0 - 52.0 % Final   MCV 04/09/2022 98.7  80.0 - 100.0 fL Final   MCH 04/09/2022 33.2  26.0 - 34.0 pg Final   MCHC 04/09/2022 33.6  30.0 - 36.0 g/dL Final   RDW 04/09/2022 12.5  11.5 - 15.5 % Final   Platelets 04/09/2022 175  150 - 400 K/uL Final   nRBC 04/09/2022 0.0  0.0 - 0.2 % Final   Neutrophils Relative % 04/09/2022  47  % Final   Neutro Abs 04/09/2022 2.0  1.7 - 7.7 K/uL Final   Lymphocytes Relative 04/09/2022 41  % Final   Lymphs Abs 04/09/2022 1.8  0.7 - 4.0 K/uL Final   Monocytes Relative 04/09/2022 9  % Final   Monocytes Absolute 04/09/2022 0.4  0.1 - 1.0 K/uL Final   Eosinophils Relative 04/09/2022 2  % Final   Eosinophils Absolute 04/09/2022 0.1  0.0 - 0.5 K/uL Final   Basophils Relative 04/09/2022 1  % Final   Basophils Absolute 04/09/2022 0.0  0.0 - 0.1 K/uL Final   Immature Granulocytes 04/09/2022 0  % Final   Abs Immature Granulocytes 04/09/2022 0.01  0.00 - 0.07 K/uL Final   Performed at [facility], [place] [address], [place], [place] [address]   Sodium 04/09/2022 143  135 - 145 mmol/L Final   Potassium 04/09/2022 3.4 (L)  3.5 - 5.1 mmol/L Final   Chloride 04/09/2022 106  98 - 111 mmol/L Final   CO2 04/09/2022 26  22 - 32 mmol/L Final   Glucose, Bld 04/09/2022 99  70 - 99 mg/dL Final   Glucose reference range applies only to samples taken after fasting for at least 8 hours.   BUN 04/09/2022 9  6 - 20 mg/dL Final   Creatinine, Ser 04/09/2022 0.70  0.61 - 1.24 mg/dL Final   Calcium 04/09/2022 9.1  8.9 - 10.3 mg/dL Final   Total Protein 04/09/2022 7.9  6.5 - 8.1 g/dL Final   Albumin 04/09/2022 4.6  3.5 - 5.0 g/dL Final   AST 04/09/2022 57 (H)  15 - 41 U/L Final   ALT 04/09/2022 56 (H)  0 - 44 U/L Final   Alkaline Phosphatase 04/09/2022 90  38 - 126 U/L Final   Total Bilirubin 04/09/2022 0.6  0.3 - 1.2 mg/dL Final   GFR, Estimated 04/09/2022 >60  >60 mL/min Final   Comment: (NOTE) Calculated using the  CKD-EPI Creatinine Equation (2021)    Anion gap 04/09/2022 11  5 - 15 Final   Performed at [facility], [place] [address] [place], [place] [address]   Opiates 04/09/2022 NONE DETECTED  NONE DETECTED Final   Cocaine 04/09/2022 NONE DETECTED  NONE DETECTED Final   Benzodiazepines 04/09/2022 POSITIVE (A)  NONE DETECTED Final   Amphetamines 04/09/2022 NONE DETECTED  NONE DETECTED Final   Tetrahydrocannabinol 04/09/2022 NONE DETECTED  NONE DETECTED Final   Barbiturates 04/09/2022 NONE DETECTED  NONE DETECTED Final   Comment: (NOTE) DRUG SCREEN FOR MEDICAL PURPOSES ONLY.  IF CONFIRMATION IS NEEDED FOR ANY PURPOSE, NOTIFY LAB WITHIN 5 DAYS.  LOWEST DETECTABLE LIMITS FOR URINE DRUG SCREEN Drug Class                     Cutoff (ng/mL) Amphetamine and metabolites    1000 Barbiturate and metabolites    200 Benzodiazepine                 938 Tricyclics and metabolites     300 Opiates and metabolites        300 Cocaine and metabolites        300 THC                            50 Performed at [facility], [place] [address] [place], [place] [address]    Alcohol, Ethyl (B) 04/09/2022 289 (H)  <10 mg/dL Final   Comment: (  NOTE) Lowest detectable limit for serum alcohol is 10 mg/dL.  For medical purposes only. Performed at [facility], [place] [address], [place], [place] [address]    Lipase 04/09/2022 31  11 - 51 U/L Final   Performed at [facility], [place] [address] [place], [place] [address]  Admission on 02/19/2022, Discharged on 02/20/2022  Component Date Value Ref Range Status   Sodium 02/19/2022 141  135 - 145 mmol/L Final   Potassium 02/19/2022 3.2 (L)  3.5 - 5.1 mmol/L Final   Chloride 02/19/2022 102  98 - 111 mmol/L Final   CO2 02/19/2022 30  22 - 32 mmol/L Final   Glucose, Bld 02/19/2022 111 (H)  70 - 99 mg/dL Final   Glucose reference range applies only to samples taken after fasting for at least 8 hours.    BUN 02/19/2022 7  6 - 20 mg/dL Final   Creatinine, Ser 02/19/2022 0.69  0.61 - 1.24 mg/dL Final   Calcium 02/19/2022 9.0  8.9 - 10.3 mg/dL Final   Total Protein 02/19/2022 7.6  6.5 - 8.1 g/dL Final   Albumin 02/19/2022 4.6  3.5 - 5.0 g/dL Final   AST 02/19/2022 54 (H)  15 - 41 U/L Final   ALT 02/19/2022 43  0 - 44 U/L Final   Alkaline Phosphatase 02/19/2022 88  38 - 126 U/L Final   Total Bilirubin 02/19/2022 0.6  0.3 - 1.2 mg/dL Final   GFR, Estimated 02/19/2022 >60  >60 mL/min Final   Comment: (NOTE) Calculated using the CKD-EPI Creatinine Equation (2021)    Anion gap 02/19/2022 9  5 - 15 Final   Performed at [facility], [address] [place], [place] [address]   WBC 02/19/2022 6.8  4.0 - 10.5 K/uL Final   RBC 02/19/2022 4.81  4.22 - 5.81 MIL/uL Final   Hemoglobin 02/19/2022 16.0  13.0 - 17.0 g/dL Final   HCT 02/19/2022 47.0  39.0 - 52.0 % Final   MCV 02/19/2022 97.7  80.0 - 100.0 fL Final   MCH 02/19/2022 33.3  26.0 - 34.0 pg Final   MCHC 02/19/2022 34.0  30.0 - 36.0 g/dL Final   RDW 02/19/2022 13.5  11.5 - 15.5 % Final   Platelets 02/19/2022 242  150 - 400 K/uL Final   nRBC 02/19/2022 0.0  0.0 - 0.2 % Final   Neutrophils Relative % 02/19/2022 53  % Final   Neutro Abs 02/19/2022 3.6  1.7 - 7.7 K/uL Final   Lymphocytes Relative 02/19/2022 35  % Final   Lymphs Abs 02/19/2022 2.4  0.7 - 4.0 K/uL Final   Monocytes Relative 02/19/2022 9  % Final   Monocytes Absolute 02/19/2022 0.6  0.1 - 1.0 K/uL Final   Eosinophils Relative 02/19/2022 2  % Final   Eosinophils Absolute 02/19/2022 0.1  0.0 - 0.5 K/uL Final   Basophils Relative 02/19/2022 1  % Final   Basophils Absolute 02/19/2022 0.1  0.0 - 0.1 K/uL Final   Immature Granulocytes 02/19/2022 0  % Final   Abs Immature Granulocytes 02/19/2022 0.02  0.00 - 0.07 K/uL Final   Performed at [facility], [address] [place], [place] [address]   Alcohol, Ethyl (B) 02/19/2022 337 (HH)  <10 mg/dL Final   Comment:  CRITICAL RESULT CALLED TO, READ BACK BY AND VERIFIED WITH: VALORIE POWELL,RN ON 02/19/22 AT 2259 BY JAG (NOTE) Lowest detectable limit for serum alcohol is 10 mg/dL.  For medical purposes  only. Performed at [facility], [address] [place], [place] [address]    Magnesium 02/19/2022 1.9  1.7 - 2.4 mg/dL Final   Performed at [facility], [address] [place], [place] [address]   Color, Urine 02/20/2022 YELLOW  YELLOW Final   APPearance 02/20/2022 CLEAR  CLEAR Final   Specific Gravity, Urine 02/20/2022 1.010  1.005 - 1.030 Final   pH 02/20/2022 6.5  5.0 - 8.0 Final   Glucose, UA 02/20/2022 NEGATIVE  NEGATIVE mg/dL Final   Hgb urine dipstick 02/20/2022 NEGATIVE  NEGATIVE Final   Bilirubin Urine 02/20/2022 NEGATIVE  NEGATIVE Final   Ketones, ur 02/20/2022 NEGATIVE  NEGATIVE mg/dL Final   Protein, ur 02/20/2022 NEGATIVE  NEGATIVE mg/dL Final   Nitrite 02/20/2022 NEGATIVE  NEGATIVE Final   Leukocytes,Ua 02/20/2022 NEGATIVE  NEGATIVE Final   Comment: Microscopic not done on urines with negative protein, blood, leukocytes, nitrite, or glucose < 500 mg/dL. Performed at [facility], [address] [place], [place] [address]    Lipase 02/19/2022 48  11 - 51 U/L Final   Performed at [facility], [address] [place], [place] [address]    Blood Alcohol level:  Lab Results  Component Value Date   ETH <10 07/26/2022   ETH 178 (H) 93/57/0177    Metabolic Disorder Labs: Lab Results  Component Value Date   HGBA1C 5.1 07/26/2022   MPG 99.67 07/26/2022   MPG 105.41 06/30/2022   No results found for: "PROLACTIN" Lab Results  Component Value Date   CHOL 236 (H) 06/30/2022   TRIG 110 06/30/2022   HDL 53 06/30/2022   CHOLHDL 4.5 06/30/2022   VLDL 22 06/30/2022   LDLCALC 161 (H) 06/30/2022    Therapeutic Lab Levels: No results found for: "LITHIUM" No results found for: "VALPROATE" No results found for: "CBMZ"  Physical  Findings   AUDIT    Flowsheet Row Admission (Discharged) from 06/28/2022 in [facility]  Alcohol Use Disorder Identification Test Final Score (AUDIT) 40      PHQ2-9    [facility] ED from 07/26/2022 in [facility] ED from 11/18/2021 in [facility]  PHQ-2 Total Score 3 3  PHQ-9 Total Score 10 10      Flowsheet Row ED from 07/26/2022 in [facility] ED from 07/16/2022 in [facility] DEPT Admission (Discharged) from 06/28/2022 in [facility]  C-SSRS RISK CATEGORY No Risk Error: Q3, 4, or 5 should not be populated when Q2 is No High Risk        Musculoskeletal  Strength & Muscle Tone: within normal limits Gait & Station: normal Patient leans: N/A  Psychiatric Specialty Exam  Presentation  General Appearance:  Casual  Eye Contact: Good  Speech: Clear and Coherent  Speech Volume: Normal  Handedness: Right   Mood and Affect  Mood: -- ("it's getting there")  Affect: Restricted   Thought Process  Thought Processes: Goal Directed  Descriptions of Associations:Intact  Orientation:Full (Time, Place and Person)  Thought Content:Logical  Diagnosis of Schizophrenia or Schizoaffective disorder in past: No    Hallucinations:Hallucinations: None  Ideas of Reference:None  Suicidal Thoughts:Suicidal Thoughts: No  Homicidal Thoughts:Homicidal Thoughts: No   Sensorium  Memory: Immediate Fair; Recent Fair  Judgment: Fair  Insight: Shallow   Executive Functions  Concentration: Fair  Attention Span: Fair  Recall: Good  Fund of Knowledge: Fair  Language: Good   Psychomotor Activity  Psychomotor Activity: Psychomotor Activity: Normal   Assets  Assets: Communication Skills; Desire for Improvement; Resilience   Sleep  Sleep: Sleep: Good   No data  recorded  Physical Exam  Physical Exam HENT:     Head: Normocephalic and atraumatic.  Pulmonary:     Effort: Pulmonary effort is normal.  Neurological:     Mental Status: He is alert and oriented to person, place, and time.    Review of Systems  Psychiatric/Behavioral:  Negative for hallucinations and suicidal ideas. The patient does not have insomnia.    Blood pressure (!) 123/93, pulse 66, temperature 97.9 F (36.6 C), temperature source Tympanic, resp. rate 16, SpO2 100 %. There is no height or weight on file to calculate BMI.  Treatment Plan Summary: Daily contact with patient to assess and evaluate symptoms and progress in treatment and Medication management   On assessment today patient mood appears to be responding well to his medications and detox. Patient is no longer having AVH and is feeling improvement in his mood. Patient displayed good judgement by trying Naltrexone after endorsing cravings. Patient was educated on benefits and possible side effects. Patient LFTs are appropriate for medication and patient does not use any opioids.    UDS+bzo BAL negative   AUD, severe  Daily drink about 1pint to 1liter daily, for past 3-4years, with last drink was 11/13. Precontemplative stage. Poor insight into disease, does not believe that EtOH is a problem, rather it helps him to feel more happy. Amenable to residential rehab, has never been. CIWA with ativan PRN per protocol with thiamine & MV supplement Start Naltrexone '25mg'$  x 2 days then increase to '50mg'$    MDD  Continued home cymbalta 60 mg daily Continued home seroquel 100 mg qHS   Tobacco use d/o Contemplative stage. Encouraged cessation NRTs   HTN Continued home amlodipine 10 mg daily   Considerations for follow-up: Recommend high risk screening every 6-12 months with HIV, RPR, hepatitis panel Recommend monitoring LFT every 6-12 months while on naltrexone   DISPO: IS amenable to residential rehab. Will attempt  to dc patient door-to-door, however if unable will discuss with CD-IOP as bridge to rehab.  Tentative date: TBA Location: TBA  PGY-3 Freida Busman, MD 07/29/2022 10:28 AM

## 2022-07-29 NOTE — ED Notes (Signed)
Patient has been isolative this morning refusing breakfast.  He has remained in his room for most of the morning.  He is cal and pleasant but avoidant and isolative.  Patient appears sad.  He denies avh shi or plan will seek out staff if overwhelmed.  Will monitor and provide safe supportive environment.

## 2022-07-29 NOTE — Progress Notes (Signed)
Patient has been awake and alert on unit sitting in dayroom with peer watching movies.  He is pleasant, easily engaged and communicates appropriately.  Patient without distress or somatic complaint.  Encouraged to make needs known to staff.  Denies avh shi or plan.  Will monitor and provide safety on unit.

## 2022-07-29 NOTE — ED Notes (Signed)
Pt. Went to [facility]

## 2022-07-29 NOTE — ED Notes (Signed)
Patient observed/assessed in room in bed appearing in no immediate distress resting peacefully. Q15 minute checks continued by MHT and nursing staff. Will continue to monitor and support. 

## 2022-07-30 NOTE — ED Notes (Signed)
Pt is in his room resting in bed. Pt denies SI/HI/AVH. No acute distress noted. Will continue to monitor for safety. 

## 2022-07-30 NOTE — ED Notes (Signed)
Patient A&Ox4. Denies intent to harm self/others when asked. Denies A/VH. Patient denies any physical complaints when asked. No acute distress noted. Routine safety checks conducted according to facility protocol. Encouraged patient to notify staff if thoughts of harm toward self or others arise. Patient verbalize understanding and agreement. Will continue to monitor for safety.    

## 2022-07-30 NOTE — ED Notes (Signed)
Pt sleeping in no acute distress. RR even and unlabored. Environment secured. Will continue to monitor for safety. 

## 2022-07-30 NOTE — ED Notes (Signed)
Patient continues to rest with no sxs of distress noted - will continue to monitor for safety

## 2022-07-30 NOTE — ED Notes (Signed)
Pt is sleeping. No distress noted. Will continue to monitor safety. 

## 2022-07-30 NOTE — ED Provider Notes (Signed)
Behavioral Health Progress Note  Date and Time: 07/30/2022 11:56 AM Name: Charles Graves MRN:  [id number]  Subjective:   Charles Graves is a 41 y.o. male, with PMH with AUD, MDD, tobacco use d/o, inpatient psych admission [facility] Oct 2023), remote suicide attempt, who presented voluntary to [facility] (07/26/2022) via [facility] for active SI, then admitted to [facility] (07/27/2022) for assistance with residential placement for AUD.     On assessment, patient is still in bed. Patient reports he is not a "breakfast person" and decided not to eat, but he did eat lunch and dinner yesterday an d feels his appetite is "good." Patient reports that he is not having any adverse side effects from the Naltrexone thus far, and does think it has already started to help with his cravings as he thinks they are lower than yesterday. Patient reports that his mood is "decent" and he slept well. Patient denies SI, HI, and AVH.  Patient does recall being on vivitrol in the past, and not liking the medication, but did feel better on naltrexone.   MD Group Therapy: Patient attended and was engaged. Patient was a bit quiter than most the group but was paying attention and endorsed good insight about the topic Distress Tolerance. 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 Family History: No family history on file. Family Psychiatric  History: See H&P Social History:  Social History   Substance and Sexual Activity  Alcohol Use Yes   Comment: state 1/2 pint/day     Social History    Substance and Sexual Activity  Drug Use Not Currently    Social History   Socioeconomic History   Marital status: Single    Spouse name: Not on file   Number of children: Not on file   Years of education: Not on file   Highest education level: Not on file  Occupational History   Not on file  Tobacco Use   Smoking status: Every Day    Packs/day: 0.50    Types: Cigarettes   Smokeless tobacco: Never  Vaping Use   Vaping Use: Never used  Substance and Sexual Activity   Alcohol use: Yes    Comment: state 1/2 pint/day   Drug use: Not Currently   Sexual activity: Not Currently  Other Topics Concern   Not on file  Social History Narrative   Not on file   Social Determinants of Health   Financial Resource Strain: Not on file  Food Insecurity: Food Insecurity Present (06/28/2022)   Hunger Vital Sign    Worried About Running Out of Food in the Last Year: Sometimes true    Ran Out of Food in the Last Year: Sometimes true  Transportation Needs: Unmet Transportation Needs (06/28/2022)   PRAPARE - Hydrologist (Medical): Yes    Lack of Transportation (Non-Medical): Yes  Physical Activity: Not on file  Stress: Not on file  Social Connections: Not on file   SDOH:  SDOH Screenings  Food Insecurity: Food Insecurity Present (06/28/2022)  Housing: High Risk (06/28/2022)  Transportation Needs: Unmet Transportation Needs (06/28/2022)  Utilities: Not At Risk (06/28/2022)  Alcohol Screen: High Risk (06/28/2022)  Depression (PHQ2-9): High Risk (07/29/2022)  Tobacco Use: High Risk (07/28/2022)   Additional Social History:    Pain Medications: Unknown Prescriptions: Seroquel, Trazadone and Cymbalta Over the Counter: Unknown History of alcohol / drug use?: Yes Longest period of sobriety (when/how long): A few days Negative Consequences of Use:  (Unknown) Withdrawal Symptoms: Tremors Name of Substance 1: Alcohol 1 - Age of First Use: 8 1 - Amount  (size/oz): "a pint or two" 1 - Frequency: Daily 1 - Last Use / Amount: 2 days ago 1 - Method of Aquiring: Self 1- Route of Use: Orally                  Sleep: Good  Appetite:  Fair  Current Medications:  Current Facility-Administered Medications  Medication Dose Route Frequency Provider Last Rate Last Admin   acetaminophen (TYLENOL) tablet 650 mg  650 mg Oral Q6H PRN [name], NP       alum & mag hydroxide-simeth (MAALOX/MYLANTA) 200-200-20 MG/5ML suspension 30 mL  30 mL Oral Q4H PRN [name], NP       amLODipine (NORVASC) tablet 10 mg  10 mg Oral QHS Merrily Brittle, DO   10 mg at 07/29/22 2138   dicyclomine (BENTYL) tablet 20 mg  20 mg Oral Q6H PRN [name], NP       DULoxetine (CYMBALTA) DR capsule 60 mg  60 mg Oral QHS Merrily Brittle, DO   60 mg at 07/29/22 2138   hydrOXYzine (ATARAX) tablet 25 mg  25 mg Oral Q6H PRN [name], NP       loperamide (IMODIUM) capsule 2-4 mg  2-4 mg Oral PRN [name], NP       magnesium hydroxide (MILK OF MAGNESIA) suspension 30 mL  30 mL Oral Daily PRN [name], NP       methocarbamol (ROBAXIN) tablet 500 mg  500 mg Oral Q8H PRN [name], NP       multivitamin with minerals tablet 1 tablet  1 tablet Oral QHS Merrily Brittle, DO   1 tablet at 07/29/22 2138   [START ON 07/31/2022] naltrexone (DEPADE) tablet 50 mg  50 mg Oral Daily Damita Dunnings B, MD       naproxen (NAPROSYN) tablet 500 mg  500 mg Oral BID PRN [name], NP       nicotine (NICODERM CQ - dosed in mg/24 hours) patch 21 mg  21 mg Transdermal Daily Merrily Brittle, DO   21 mg at 07/30/22 1031   nicotine polacrilex (NICORETTE) gum 4 mg  4 mg Oral PRN Merrily Brittle, DO       ondansetron (ZOFRAN-ODT) disintegrating tablet 4 mg  4 mg Oral Q6H PRN [name], NP       QUEtiapine (SEROQUEL) tablet 100 mg  100 mg Oral QHS [name], NP   100 mg at 07/29/22 2138   thiamine (VITAMIN B1) tablet 100 mg  100 mg Oral QHS Merrily Brittle, DO   100 mg at 07/29/22  2137   traZODone (DESYREL) tablet 50 mg  50 mg Oral QHS PRN [name], NP       Current Outpatient Medications  Medication Sig Dispense Refill   amLODipine (NORVASC) 10 MG tablet Take 1 tablet (10 mg total) by mouth daily. 30 tablet 0   DULoxetine (CYMBALTA) 60 MG  capsule Take 1 capsule (60 mg total) by mouth daily. 30 capsule 0   ondansetron (ZOFRAN-ODT) 4 MG disintegrating tablet Take 4 mg by mouth every 8 (eight) hours as needed for nausea or vomiting.     QUEtiapine (SEROQUEL) 100 MG tablet Take 1 tablet (100 mg total) by mouth at bedtime. 30 tablet 0    Labs  Lab Results:  Admission on 07/26/2022  Component Date Value Ref Range Status   SARS Coronavirus 2 by RT PCR 07/26/2022 NEGATIVE  NEGATIVE Final   Comment: (NOTE) SARS-CoV-2 target nucleic acids are NOT DETECTED.  The SARS-CoV-2 RNA is generally detectable in upper respiratory specimens during the acute phase of infection. The lowest concentration of SARS-CoV-2 viral copies this assay can detect is 138 copies/mL. A negative result does not preclude SARS-Cov-2 infection and should not be used as the sole basis for treatment or other patient management decisions. A negative result may occur with  improper specimen collection/handling, submission of specimen other than nasopharyngeal swab, presence of viral mutation(s) within the areas targeted by this assay, and inadequate number of viral copies(<138 copies/mL). A negative result must be combined with clinical observations, patient history, and epidemiological information. The expected result is Negative.  Fact Sheet for Patients:  EntrepreneurPulse.com.au  Fact Sheet for Healthcare Providers:  IncredibleEmployment.be  This test is no                          t yet approved or cleared by the [place] FDA and  has been authorized for detection and/or diagnosis of SARS-CoV-2 by FDA under an Emergency Use Authorization (EUA).  This EUA will remain  in effect (meaning this test can be used) for the duration of the COVID-19 declaration under Section 564(b)(1) of the Act, 21 U.S.C.section 360bbb-3(b)(1), unless the authorization is terminated  or revoked sooner.       Influenza A by PCR 07/26/2022 NEGATIVE  NEGATIVE Final   Influenza B by PCR 07/26/2022 NEGATIVE  NEGATIVE Final   Comment: (NOTE) The Xpert Xpress SARS-CoV-2/FLU/RSV plus assay is intended as an aid in the diagnosis of influenza from Nasopharyngeal swab specimens and should not be used as a sole basis for treatment. Nasal washings and aspirates are unacceptable for Xpert Xpress SARS-CoV-2/FLU/RSV testing.  Fact Sheet for Patients: EntrepreneurPulse.com.au  Fact Sheet for Healthcare Providers: IncredibleEmployment.be  This test is not yet approved or cleared by the [place] FDA and has been authorized for detection and/or diagnosis of SARS-CoV-2 by FDA under an Emergency Use Authorization (EUA). This EUA will remain in effect (meaning this test can be used) for the duration of the COVID-19 declaration under Section 564(b)(1) of the Act, 21 U.S.C. section 360bbb-3(b)(1), unless the authorization is terminated or revoked.  Performed at [facility], [place] [address] [place], [place] [address]    WBC 07/26/2022 8.2  4.0 - 10.5 K/uL Final   RBC 07/26/2022 5.41  4.22 - 5.81 MIL/uL Final   Hemoglobin 07/26/2022 17.3 (H)  13.0 - 17.0 g/dL Final   HCT 07/26/2022 51.9  39.0 - 52.0 % Final   MCV 07/26/2022 95.9  80.0 - 100.0 fL Final   MCH 07/26/2022 32.0  26.0 - 34.0 pg Final   MCHC 07/26/2022 33.3  30.0 - 36.0 g/dL Final   RDW 07/26/2022 13.1  11.5 - 15.5 % Final   Platelets 07/26/2022 277  150 - 400 K/uL Final   nRBC 07/26/2022 0.0  0.0 - 0.2 %  Final   Neutrophils Relative % 07/26/2022 69  % Final   Neutro Abs 07/26/2022 5.7  1.7 - 7.7 K/uL Final   Lymphocytes Relative 07/26/2022 21  % Final    Lymphs Abs 07/26/2022 1.7  0.7 - 4.0 K/uL Final   Monocytes Relative 07/26/2022 7  % Final   Monocytes Absolute 07/26/2022 0.6  0.1 - 1.0 K/uL Final   Eosinophils Relative 07/26/2022 2  % Final   Eosinophils Absolute 07/26/2022 0.1  0.0 - 0.5 K/uL Final   Basophils Relative 07/26/2022 1  % Final   Basophils Absolute 07/26/2022 0.1  0.0 - 0.1 K/uL Final   Immature Granulocytes 07/26/2022 0  % Final   Abs Immature Granulocytes 07/26/2022 0.02  0.00 - 0.07 K/uL Final   Performed at [facility], [place] [address], [place], [place] [address]   Sodium 07/26/2022 141  135 - 145 mmol/L Final   Potassium 07/26/2022 3.7  3.5 - 5.1 mmol/L Final   Chloride 07/26/2022 102  98 - 111 mmol/L Final   CO2 07/26/2022 26  22 - 32 mmol/L Final   Glucose, Bld 07/26/2022 96  70 - 99 mg/dL Final   Glucose reference range applies only to samples taken after fasting for at least 8 hours.   BUN 07/26/2022 9  6 - 20 mg/dL Final   Creatinine, Ser 07/26/2022 0.81  0.61 - 1.24 mg/dL Final   Calcium 07/26/2022 9.9  8.9 - 10.3 mg/dL Final   Total Protein 07/26/2022 7.4  6.5 - 8.1 g/dL Final   Albumin 07/26/2022 4.7  3.5 - 5.0 g/dL Final   AST 07/26/2022 18  15 - 41 U/L Final   ALT 07/26/2022 16  0 - 44 U/L Final   Alkaline Phosphatase 07/26/2022 92  38 - 126 U/L Final   Total Bilirubin 07/26/2022 0.6  0.3 - 1.2 mg/dL Final   GFR, Estimated 07/26/2022 >60  >60 mL/min Final   Comment: (NOTE) Calculated using the CKD-EPI Creatinine Equation (2021)    Anion gap 07/26/2022 13  5 - 15 Final   Performed at [place] [address] [place], [place] [address]   Hgb A1c MFr Bld 07/26/2022 5.1  4.8 - 5.6 % Final   Comment: (NOTE) Pre diabetes:          5.7%-6.4%  Diabetes:              >6.4%  Glycemic control for   <7.0% adults with diabetes    Mean Plasma Glucose 07/26/2022 99.67  mg/dL Final   Performed at [facility], [place] [address] [place], [place] [address]   Alcohol, Ethyl (B) 07/26/2022  <10  <10 mg/dL Final   Comment: (NOTE) Lowest detectable limit for serum alcohol is 10 mg/dL.  For medical purposes only. Performed at [facility], [place] [address] [place], [place] [address]    POC Amphetamine UR 07/27/2022 None Detected  NONE DETECTED (Cut Off Level 1000 ng/mL) Final   POC Secobarbital (BAR) 07/27/2022 None Detected  NONE DETECTED (Cut Off Level 300 ng/mL) Final   POC Buprenorphine (BUP) 07/27/2022 None Detected  NONE DETECTED (Cut Off Level 10 ng/mL) Final   POC Oxazepam (BZO) 07/27/2022 Positive (A)  NONE DETECTED (Cut Off Level 300 ng/mL) Final   POC Cocaine UR 07/27/2022 None Detected  NONE DETECTED (Cut Off Level 300 ng/mL) Final   POC Methamphetamine UR 07/27/2022 None Detected  NONE DETECTED (Cut Off Level 1000 ng/mL) Final   POC  Morphine 07/27/2022 None Detected  NONE DETECTED (Cut Off Level 300 ng/mL) Final   POC Methadone UR 07/27/2022 None Detected  NONE DETECTED (Cut Off Level 300 ng/mL) Final   POC Oxycodone UR 07/27/2022 None Detected  NONE DETECTED (Cut Off Level 100 ng/mL) Final   POC Marijuana UR 07/27/2022 None Detected  NONE DETECTED (Cut Off Level 50 ng/mL) Final   SARSCOV2ONAVIRUS 2 AG 07/27/2022 NEGATIVE  NEGATIVE Final   Comment: (NOTE) SARS-CoV-2 antigen NOT DETECTED.   Negative results are presumptive.  Negative results do not preclude SARS-CoV-2 infection and should not be used as the sole basis for treatment or other patient management decisions, including infection  control decisions, particularly in the presence of clinical signs and  symptoms consistent with COVID-19, or in those who have been in contact with the virus.  Negative results must be combined with clinical observations, patient history, and epidemiological information. The expected result is Negative.  Fact Sheet for Patients: HandmadeRecipes.com.cy  Fact Sheet for Healthcare Providers: FuneralLife.at  This test is not  yet approved or cleared by the [place] FDA and  has been authorized for detection and/or diagnosis of SARS-CoV-2 by FDA under an Emergency Use Authorization (EUA).  This EUA will remain in effect (meaning this test can be used) for the duration of  the COV                          ID-19 declaration under Section 564(b)(1) of the Act, 21 U.S.C. section 360bbb-3(b)(1), unless the authorization is terminated or revoked sooner.     TSH 07/26/2022 1.404  0.350 - 4.500 uIU/mL Final   Comment: Performed by a 3rd Generation assay with a functional sensitivity of <=0.01 uIU/mL. Performed at [facility], [place] [address], [place], [place] [address]   Admission on 07/16/2022, Discharged on 07/16/2022  Component Date Value Ref Range Status   WBC 07/16/2022 7.5  4.0 - 10.5 K/uL Final   RBC 07/16/2022 4.74  4.22 - 5.81 MIL/uL Final   Hemoglobin 07/16/2022 15.3  13.0 - 17.0 g/dL Final   HCT 07/16/2022 44.5  39.0 - 52.0 % Final   MCV 07/16/2022 93.9  80.0 - 100.0 fL Final   MCH 07/16/2022 32.3  26.0 - 34.0 pg Final   MCHC 07/16/2022 34.4  30.0 - 36.0 g/dL Final   RDW 07/16/2022 13.2  11.5 - 15.5 % Final   Platelets 07/16/2022 249  150 - 400 K/uL Final   nRBC 07/16/2022 0.0  0.0 - 0.2 % Final   Neutrophils Relative % 07/16/2022 63  % Final   Neutro Abs 07/16/2022 4.7  1.7 - 7.7 K/uL Final   Lymphocytes Relative 07/16/2022 28  % Final   Lymphs Abs 07/16/2022 2.1  0.7 - 4.0 K/uL Final   Monocytes Relative 07/16/2022 7  % Final   Monocytes Absolute 07/16/2022 0.5  0.1 - 1.0 K/uL Final   Eosinophils Relative 07/16/2022 2  % Final   Eosinophils Absolute 07/16/2022 0.2  0.0 - 0.5 K/uL Final   Basophils Relative 07/16/2022 0  % Final   Basophils Absolute 07/16/2022 0.0  0.0 - 0.1 K/uL Final   Immature Granulocytes 07/16/2022 0  % Final   Abs Immature Granulocytes 07/16/2022 0.02  0.00 - 0.07 K/uL Final   Performed at [facility], [place] [address] [place], [place] [address]    Sodium 07/16/2022 140  135 - 145 mmol/L Final   Potassium 07/16/2022 3.2 (L)  3.5 - 5.1 mmol/L Final   Chloride 07/16/2022 104  98 - 111 mmol/L Final   CO2 07/16/2022 24  22 - 32 mmol/L Final   Glucose, Bld 07/16/2022 115 (H)  70 - 99 mg/dL Final   Glucose reference range applies only to samples taken after fasting for at least 8 hours.   BUN 07/16/2022 8  6 - 20 mg/dL Final   Creatinine, Ser 07/16/2022 0.76  0.61 - 1.24 mg/dL Final   Calcium 07/16/2022 8.9  8.9 - 10.3 mg/dL Final   Total Protein 07/16/2022 6.8  6.5 - 8.1 g/dL Final   Albumin 07/16/2022 4.0  3.5 - 5.0 g/dL Final   AST 07/16/2022 28  15 - 41 U/L Final   ALT 07/16/2022 18  0 - 44 U/L Final   Alkaline Phosphatase 07/16/2022 64  38 - 126 U/L Final   Total Bilirubin 07/16/2022 0.7  0.3 - 1.2 mg/dL Final   GFR, Estimated 07/16/2022 >60  >60 mL/min Final   Comment: (NOTE) Calculated using the CKD-EPI Creatinine Equation (2021)    Anion gap 07/16/2022 12  5 - 15 Final   Performed at [facility], [place] [address] [place], [place] [address]   Alcohol, Ethyl (B) 07/16/2022 178 (H)  <10 mg/dL Final   Comment: (NOTE) Lowest detectable limit for serum alcohol is 10 mg/dL.  For medical purposes only. Performed at [facility], [place] [address] [place], [place] [address]   Admission on 06/28/2022, Discharged on 07/03/2022  Component Date Value Ref Range Status   Hgb A1c MFr Bld 06/30/2022 5.3  4.8 - 5.6 % Final   Comment: (NOTE) Pre diabetes:          5.7%-6.4%  Diabetes:              >6.4%  Glycemic control for   <7.0% adults with diabetes    Mean Plasma Glucose 06/30/2022 105.41  mg/dL Final   Performed at [place] [address] [place], [place] [address]   Cholesterol 06/30/2022 236 (H)  0 - 200 mg/dL Final   Triglycerides 06/30/2022 110  <150 mg/dL Final   HDL 06/30/2022 53  >40 mg/dL Final   Total CHOL/HDL Ratio 06/30/2022 4.5  RATIO Final   VLDL 06/30/2022 22  0  - 40 mg/dL Final   LDL Cholesterol 06/30/2022 161 (H)  0 - 99 mg/dL Final   Comment:        Total Cholesterol/HDL:CHD Risk Coronary Heart Disease Risk Table                     Men   Women  1/2 Average Risk   3.4   3.3  Average Risk       5.0   4.4  2 X Average Risk   9.6   7.1  3 X Average Risk  23.4   11.0        Use the calculated Patient Ratio above and the CHD Risk Table to determine the patient's CHD Risk.        ATP III CLASSIFICATION (LDL):  <100     mg/dL   Optimal  100-129  mg/dL   Near or Above                    Optimal  130-159  mg/dL   Borderline  160-189  mg/dL   High  >190     mg/dL   Very High Performed at  [facility], [place] [address] [place], [place] [address]    Potassium 06/30/2022 3.6  3.5 - 5.1 mmol/L Final   Performed at [place] [address] [place], [place] [address]   Magnesium 07/02/2022 2.4  1.7 - 2.4 mg/dL Final   Performed at [place] [address] [place], [place] [address]   Sodium 07/02/2022 139  135 - 145 mmol/L Final   Potassium 07/02/2022 3.8  3.5 - 5.1 mmol/L Final   Chloride 07/02/2022 107  98 - 111 mmol/L Final   CO2 07/02/2022 25  22 - 32 mmol/L Final   Glucose, Bld 07/02/2022 97  70 - 99 mg/dL Final   Glucose reference range applies only to samples taken after fasting for at least 8 hours.   BUN 07/02/2022 19  6 - 20 mg/dL Final   Creatinine, Ser 07/02/2022 0.73  0.61 - 1.24 mg/dL Final   Calcium 07/02/2022 9.0  8.9 - 10.3 mg/dL Final   GFR, Estimated 07/02/2022 >60  >60 mL/min Final   Comment: (NOTE) Calculated using the CKD-EPI Creatinine Equation (2021)    Anion gap 07/02/2022 7  5 - 15 Final   Performed at [facility], [place] [address], [place], [place] [address]  Admission on 06/27/2022, Discharged on 06/28/2022  Component Date Value Ref Range Status   Sodium 06/27/2022 143  135 - 145 mmol/L Final   Potassium 06/27/2022 3.4 (L)  3.5 -  5.1 mmol/L Final   Chloride 06/27/2022 107  98 - 111 mmol/L Final   CO2 06/27/2022 26  22 - 32 mmol/L Final   Glucose, Bld 06/27/2022 96  70 - 99 mg/dL Final   Glucose reference range applies only to samples taken after fasting for at least 8 hours.   BUN 06/27/2022 6  6 - 20 mg/dL Final   Creatinine, Ser 06/27/2022 0.76  0.61 - 1.24 mg/dL Final   Calcium 06/27/2022 8.7 (L)  8.9 - 10.3 mg/dL Final   Total Protein 06/27/2022 7.5  6.5 - 8.1 g/dL Final   Albumin 06/27/2022 4.4  3.5 - 5.0 g/dL Final   AST 06/27/2022 20  15 - 41 U/L Final   ALT 06/27/2022 17  0 - 44 U/L Final   Alkaline Phosphatase 06/27/2022 82  38 - 126 U/L Final   Total Bilirubin 06/27/2022 0.5  0.3 - 1.2 mg/dL Final   GFR, Estimated 06/27/2022 >60  >60 mL/min Final   Comment: (NOTE) Calculated using the CKD-EPI Creatinine Equation (2021)    Anion gap 06/27/2022 10  5 - 15 Final   Performed at [facility], [place] [address], [place], [place] [address]   Alcohol, Ethyl (B) 06/27/2022 344 (HH)  <10 mg/dL Final   Comment: CRITICAL RESULT CALLED TO, READ BACK BY AND VERIFIED WITH GRAY,S RN @ [phone] ON 1017 BY MAHMOUD,S (NOTE) Lowest detectable limit for serum alcohol is 10 mg/dL.  For medical purposes only. Performed at [facility], [place] [address], [place], [place] [address]    Salicylate Lvl 87/56/4332 <7.0 (L)  7.0 - 30.0 mg/dL Final   Performed at [place] [address], [place], [place] [address]   Acetaminophen (Tylenol), Serum 06/27/2022 <10 (L)  10 - 30 ug/mL Final   Comment: (NOTE) Therapeutic concentrations vary significantly. A range of 10-30 ug/mL  may be an effective concentration for many patients. However, some  are best treated at concentrations outside of this range. Acetaminophen concentrations >  150 ug/mL at 4 hours after ingestion  and >50 ug/mL at 12 hours after ingestion are often associated with  toxic reactions.  Performed at [facility], [place] [address], [place], [place] [address]    WBC 06/27/2022 6.8  4.0 - 10.5 K/uL Final   RBC 06/27/2022 4.94  4.22 - 5.81 MIL/uL Final   Hemoglobin 06/27/2022 15.9  13.0 - 17.0 g/dL Final   HCT 06/27/2022 48.0  39.0 - 52.0 % Final   MCV 06/27/2022 97.2  80.0 - 100.0 fL Final   MCH 06/27/2022 32.2  26.0 - 34.0 pg Final   MCHC 06/27/2022 33.1  30.0 - 36.0 g/dL Final   RDW 06/27/2022 13.3  11.5 - 15.5 % Final   Platelets 06/27/2022 231  150 - 400 K/uL Final   nRBC 06/27/2022 0.0  0.0 - 0.2 % Final   Performed at [facility], [place] [address], [place], [place] [address]   Opiates 06/27/2022 NONE DETECTED  NONE DETECTED Final   Cocaine 06/27/2022 NONE DETECTED  NONE DETECTED Final   Benzodiazepines 06/27/2022 NONE DETECTED  NONE DETECTED Final   Amphetamines 06/27/2022 NONE DETECTED  NONE DETECTED Final   Tetrahydrocannabinol 06/27/2022 POSITIVE (A)  NONE DETECTED Final   Barbiturates 06/27/2022 NONE DETECTED  NONE DETECTED Final   Comment: (NOTE) DRUG SCREEN FOR MEDICAL PURPOSES ONLY.  IF CONFIRMATION IS NEEDED FOR ANY PURPOSE, NOTIFY LAB WITHIN 5 DAYS.  LOWEST DETECTABLE LIMITS FOR URINE DRUG SCREEN Drug Class                     Cutoff (ng/mL) Amphetamine and metabolites    1000 Barbiturate and metabolites    200 Benzodiazepine                 200 Opiates and metabolites        300 Cocaine and metabolites        300 THC                            50 Performed at [facility], [place] [address], [place], [place] [address]    SARS Coronavirus 2 by RT PCR 06/27/2022 NEGATIVE  NEGATIVE Final   Comment: (NOTE) SARS-CoV-2 target nucleic acids are NOT DETECTED.  The SARS-CoV-2 RNA is generally detectable in upper respiratory specimens during the acute phase of infection. The lowest concentration of SARS-CoV-2 viral copies this assay can detect is 138 copies/mL. A negative result does not preclude SARS-Cov-2 infection  and should not be used as the sole basis for treatment or other patient management decisions. A negative result may occur with  improper specimen collection/handling, submission of specimen other than nasopharyngeal swab, presence of viral mutation(s) within the areas targeted by this assay, and inadequate number of viral copies(<138 copies/mL). A negative result must be combined with clinical observations, patient history, and epidemiological information. The expected result is Negative.  Fact Sheet for Patients:  EntrepreneurPulse.com.au  Fact Sheet for Healthcare Providers:  IncredibleEmployment.be  This test is no                          t yet approved or cleared by the [place] FDA and  has been authorized for detection and/or diagnosis of SARS-CoV-2 by FDA under an Emergency Use Authorization (EUA). This EUA will remain  in effect (meaning this test can be used) for the duration  of the COVID-19 declaration under Section 564(b)(1) of the Act, 21 U.S.C.section 360bbb-3(b)(1), unless the authorization is terminated  or revoked sooner.       Influenza A by PCR 06/27/2022 NEGATIVE  NEGATIVE Final   Influenza B by PCR 06/27/2022 NEGATIVE  NEGATIVE Final   Comment: (NOTE) The Xpert Xpress SARS-CoV-2/FLU/RSV plus assay is intended as an aid in the diagnosis of influenza from Nasopharyngeal swab specimens and should not be used as a sole basis for treatment. Nasal washings and aspirates are unacceptable for Xpert Xpress SARS-CoV-2/FLU/RSV testing.  Fact Sheet for Patients: EntrepreneurPulse.com.au  Fact Sheet for Healthcare Providers: IncredibleEmployment.be  This test is not yet approved or cleared by the [place] FDA and has been authorized for detection and/or diagnosis of SARS-CoV-2 by FDA under an Emergency Use Authorization (EUA). This EUA will remain in effect (meaning this test can be  used) for the duration of the COVID-19 declaration under Section 564(b)(1) of the Act, 21 U.S.C. section 360bbb-3(b)(1), unless the authorization is terminated or revoked.  Performed at [facility], [place] [address] [place], [place] [address]   Admission on 06/17/2022, Discharged on 06/17/2022  Component Date Value Ref Range Status   Alcohol, Ethyl (B) 06/17/2022 209 (H)  <10 mg/dL Final   Comment: (NOTE) Lowest detectable limit for serum alcohol is 10 mg/dL.  For medical purposes only. Performed at [facility], [address] [place], [place] [address]    Prothrombin Time 06/17/2022 12.8  11.4 - 15.2 seconds Final   INR 06/17/2022 1.0  0.8 - 1.2 Final   Comment: (NOTE) INR goal varies based on device and disease states. Performed at [facility], [address] [place], [place] [address]    aPTT 06/17/2022 28  24 - 36 seconds Final   Performed at [facility], [address] [place], [place] [address]   WBC 06/17/2022 6.9  4.0 - 10.5 K/uL Final   RBC 06/17/2022 4.99  4.22 - 5.81 MIL/uL Final   Hemoglobin 06/17/2022 15.9  13.0 - 17.0 g/dL Final   HCT 06/17/2022 47.3  39.0 - 52.0 % Final   MCV 06/17/2022 94.8  80.0 - 100.0 fL Final   MCH 06/17/2022 31.9  26.0 - 34.0 pg Final   MCHC 06/17/2022 33.6  30.0 - 36.0 g/dL Final   RDW 06/17/2022 12.8  11.5 - 15.5 % Final   Platelets 06/17/2022 262  150 - 400 K/uL Final   nRBC 06/17/2022 0.0  0.0 - 0.2 % Final   Performed at [facility], [address] [place], [place] [address]   Neutrophils Relative % 06/17/2022 55  % Final   Neutro Abs 06/17/2022 3.9  1.7 - 7.7 K/uL Final   Lymphocytes Relative 06/17/2022 33  % Final   Lymphs Abs 06/17/2022 2.3  0.7 - 4.0 K/uL Final   Monocytes Relative 06/17/2022 7  % Final   Monocytes Absolute 06/17/2022 0.5  0.1 - 1.0 K/uL Final   Eosinophils Relative 06/17/2022 4  % Final   Eosinophils Absolute 06/17/2022 0.2  0.0 - 0.5  K/uL Final   Basophils Relative 06/17/2022 1  % Final   Basophils Absolute 06/17/2022 0.1  0.0 - 0.1 K/uL Final   Immature Granulocytes 06/17/2022 0  % Final   Abs Immature Granulocytes 06/17/2022 0.01  0.00 - 0.07 K/uL Final   Performed at [facility], [address] [place], [place] [address]  Sodium 06/17/2022 141  135 - 145 mmol/L Final   Potassium 06/17/2022 3.3 (L)  3.5 - 5.1 mmol/L Final   Chloride 06/17/2022 105  98 - 111 mmol/L Final   CO2 06/17/2022 27  22 - 32 mmol/L Final   Glucose, Bld 06/17/2022 96  70 - 99 mg/dL Final   Glucose reference range applies only to samples taken after fasting for at least 8 hours.   BUN 06/17/2022 7  6 - 20 mg/dL Final   Creatinine, Ser 06/17/2022 0.74  0.61 - 1.24 mg/dL Final   Calcium 06/17/2022 8.6 (L)  8.9 - 10.3 mg/dL Final   Total Protein 06/17/2022 7.2  6.5 - 8.1 g/dL Final   Albumin 06/17/2022 4.1  3.5 - 5.0 g/dL Final   AST 06/17/2022 28  15 - 41 U/L Final   ALT 06/17/2022 32  0 - 44 U/L Final   Alkaline Phosphatase 06/17/2022 83  38 - 126 U/L Final   Total Bilirubin 06/17/2022 0.5  0.3 - 1.2 mg/dL Final   GFR, Estimated 06/17/2022 >60  >60 mL/min Final   Comment: (NOTE) Calculated using the CKD-EPI Creatinine Equation (2021)    Anion gap 06/17/2022 9  5 - 15 Final   Performed at [facility], [address] [place], [place] [address]   Glucose-Capillary 06/17/2022 90  70 - 99 mg/dL Final   Glucose reference range applies only to samples taken after fasting for at least 8 hours.  Admission on 05/02/2022, Discharged on 05/03/2022  Component Date Value Ref Range Status   Sodium 05/02/2022 146 (H)  135 - 145 mmol/L Final   Potassium 05/02/2022 3.9  3.5 - 5.1 mmol/L Final   Chloride 05/02/2022 106  98 - 111 mmol/L Final   CO2 05/02/2022 29  22 - 32 mmol/L Final   Glucose, Bld 05/02/2022 98  70 - 99 mg/dL Final   Glucose reference range applies only to samples taken after fasting for at least 8 hours.   BUN  05/02/2022 5 (L)  6 - 20 mg/dL Final   Creatinine, Ser 05/02/2022 0.81  0.61 - 1.24 mg/dL Final   Calcium 05/02/2022 8.9  8.9 - 10.3 mg/dL Final   Total Protein 05/02/2022 6.8  6.5 - 8.1 g/dL Final   Albumin 05/02/2022 4.2  3.5 - 5.0 g/dL Final   AST 05/02/2022 39  15 - 41 U/L Final   ALT 05/02/2022 35  0 - 44 U/L Final   Alkaline Phosphatase 05/02/2022 74  38 - 126 U/L Final   Total Bilirubin 05/02/2022 0.6  0.3 - 1.2 mg/dL Final   GFR, Estimated 05/02/2022 >60  >60 mL/min Final   Comment: (NOTE) Calculated using the CKD-EPI Creatinine Equation (2021)    Anion gap 05/02/2022 11  5 - 15 Final   Performed at [place] [address], [place], [place] [address]   Alcohol, Ethyl (B) 05/02/2022 327 (HH)  <10 mg/dL Final   Comment: CRITICAL RESULT CALLED TO, READ BACK BY AND VERIFIED WITH K.MUNNETT, RN [id number] 08.23.23 MRIVET (NOTE) Lowest detectable limit for serum alcohol is 10 mg/dL.  For medical purposes only. Performed at [facility], [place] [address] [place], [place] [address]    WBC 05/02/2022 5.5  4.0 - 10.5 K/uL Final   RBC 05/02/2022 4.81  4.22 - 5.81 MIL/uL Final   Hemoglobin 05/02/2022 15.7  13.0 - 17.0 g/dL Final   HCT 05/02/2022 47.1  39.0 - 52.0 % Final   MCV  05/02/2022 97.9  80.0 - 100.0 fL Final   MCH 05/02/2022 32.6  26.0 - 34.0 pg Final   MCHC 05/02/2022 33.3  30.0 - 36.0 g/dL Final   RDW 05/02/2022 12.3  11.5 - 15.5 % Final   Platelets 05/02/2022 233  150 - 400 K/uL Final   nRBC 05/02/2022 0.0  0.0 - 0.2 % Final   Neutrophils Relative % 05/02/2022 49  % Final   Neutro Abs 05/02/2022 2.6  1.7 - 7.7 K/uL Final   Lymphocytes Relative 05/02/2022 42  % Final   Lymphs Abs 05/02/2022 2.3  0.7 - 4.0 K/uL Final   Monocytes Relative 05/02/2022 6  % Final   Monocytes Absolute 05/02/2022 0.3  0.1 - 1.0 K/uL Final   Eosinophils Relative 05/02/2022 2  % Final   Eosinophils Absolute 05/02/2022 0.1  0.0 - 0.5 K/uL Final   Basophils Relative 05/02/2022 1  % Final    Basophils Absolute 05/02/2022 0.1  0.0 - 0.1 K/uL Final   Immature Granulocytes 05/02/2022 0  % Final   Abs Immature Granulocytes 05/02/2022 0.01  0.00 - 0.07 K/uL Final   Performed at [facility], [place] [address], [place], [place] [address]   Lipase 05/02/2022 44  11 - 51 U/L Final   Performed at [place] [address], [place], [place] [address]   Troponin I (High Sensitivity) 05/02/2022 5  <18 ng/L Final   Comment: (NOTE) Elevated high sensitivity troponin I (hsTnI) values and significant  changes across serial measurements may suggest ACS but many other  chronic and acute conditions are known to elevate hsTnI results.  Refer to the "Links" section for chest pain algorithms and additional  guidance. Performed at [facility], [place] [address], [place], [place] [address]    Ammonia 05/02/2022 20  9 - 35 umol/L Final   Performed at [place] [address], [place], [place] [address]   Glucose-Capillary 05/02/2022 86  70 - 99 mg/dL Final   Glucose reference range applies only to samples taken after fasting for at least 8 hours.   Comment 1 05/02/2022 Notify RN   Final   Comment 2 05/02/2022 Document in Chart   Final   Troponin I (High Sensitivity) 05/03/2022 4  <18 ng/L Final   Comment: (NOTE) Elevated high sensitivity troponin I (hsTnI) values and significant  changes across serial measurements may suggest ACS but many other  chronic and acute conditions are known to elevate hsTnI results.  Refer to the "Links" section for chest pain algorithms and additional  guidance. Performed at [facility], [place] [address], [place], [place] [address]   Admission on 04/09/2022, Discharged on 04/09/2022  Component Date Value Ref Range Status   WBC 04/09/2022 4.4  4.0 - 10.5 K/uL Final   RBC 04/09/2022 4.67  4.22 - 5.81 MIL/uL Final   Hemoglobin 04/09/2022 15.5  13.0 - 17.0 g/dL Final   HCT 04/09/2022 46.1  39.0 - 52.0 % Final   MCV 04/09/2022 98.7   80.0 - 100.0 fL Final   MCH 04/09/2022 33.2  26.0 - 34.0 pg Final   MCHC 04/09/2022 33.6  30.0 - 36.0 g/dL Final   RDW 04/09/2022 12.5  11.5 - 15.5 % Final   Platelets 04/09/2022 175  150 - 400 K/uL Final   nRBC 04/09/2022 0.0  0.0 - 0.2 % Final   Neutrophils Relative % 04/09/2022 47  % Final   Neutro Abs 04/09/2022 2.0  1.7 - 7.7 K/uL Final   Lymphocytes Relative 04/09/2022 41  % Final   Lymphs Abs 04/09/2022 1.8  0.7 - 4.0 K/uL Final   Monocytes Relative 04/09/2022 9  % Final   Monocytes Absolute 04/09/2022 0.4  0.1 - 1.0 K/uL Final   Eosinophils Relative 04/09/2022 2  % Final   Eosinophils Absolute 04/09/2022 0.1  0.0 - 0.5 K/uL Final   Basophils Relative 04/09/2022 1  % Final   Basophils Absolute 04/09/2022 0.0  0.0 - 0.1 K/uL Final   Immature Granulocytes 04/09/2022 0  % Final   Abs Immature Granulocytes 04/09/2022 0.01  0.00 - 0.07 K/uL Final   Performed at [facility], [place] [address], [place], [place] [address]   Sodium 04/09/2022 143  135 - 145 mmol/L Final   Potassium 04/09/2022 3.4 (L)  3.5 - 5.1 mmol/L Final   Chloride 04/09/2022 106  98 - 111 mmol/L Final   CO2 04/09/2022 26  22 - 32 mmol/L Final   Glucose, Bld 04/09/2022 99  70 - 99 mg/dL Final   Glucose reference range applies only to samples taken after fasting for at least 8 hours.   BUN 04/09/2022 9  6 - 20 mg/dL Final   Creatinine, Ser 04/09/2022 0.70  0.61 - 1.24 mg/dL Final   Calcium 04/09/2022 9.1  8.9 - 10.3 mg/dL Final   Total Protein 04/09/2022 7.9  6.5 - 8.1 g/dL Final   Albumin 04/09/2022 4.6  3.5 - 5.0 g/dL Final   AST 04/09/2022 57 (H)  15 - 41 U/L Final   ALT 04/09/2022 56 (H)  0 - 44 U/L Final   Alkaline Phosphatase 04/09/2022 90  38 - 126 U/L Final   Total Bilirubin 04/09/2022 0.6  0.3 - 1.2 mg/dL Final   GFR, Estimated 04/09/2022 >60  >60 mL/min Final   Comment: (NOTE) Calculated using the CKD-EPI Creatinine Equation (2021)    Anion gap 04/09/2022 11  5 - 15 Final   Performed  at [facility], [place] [address], [place], [place] [address]   Opiates 04/09/2022 NONE DETECTED  NONE DETECTED Final   Cocaine 04/09/2022 NONE DETECTED  NONE DETECTED Final   Benzodiazepines 04/09/2022 POSITIVE (A)  NONE DETECTED Final   Amphetamines 04/09/2022 NONE DETECTED  NONE DETECTED Final   Tetrahydrocannabinol 04/09/2022 NONE DETECTED  NONE DETECTED Final   Barbiturates 04/09/2022 NONE DETECTED  NONE DETECTED Final   Comment: (NOTE) DRUG SCREEN FOR MEDICAL PURPOSES ONLY.  IF CONFIRMATION IS NEEDED FOR ANY PURPOSE, NOTIFY LAB WITHIN 5 DAYS.  LOWEST DETECTABLE LIMITS FOR URINE DRUG SCREEN Drug Class                     Cutoff (ng/mL) Amphetamine and metabolites    1000 Barbiturate and metabolites    200 Benzodiazepine                 102 Tricyclics and metabolites     300 Opiates and metabolites        300 Cocaine and metabolites        300 THC                            50 Performed at [facility], [place] [address], [place], [place] [address]    Alcohol, Ethyl (B) 04/09/2022 289 (H)  <10 mg/dL Final   Comment: (NOTE) Lowest detectable limit for serum alcohol is 10 mg/dL.  For medical purposes only. Performed at [facility], [place] [address], [place], [place] [address]    Lipase 04/09/2022 31  11 - 51 U/L Final   Performed at [facility], [place] [address], [place], [place] [address]  Admission on 02/19/2022, Discharged on 02/20/2022  Component Date Value Ref Range Status   Sodium 02/19/2022 141  135 - 145 mmol/L Final   Potassium 02/19/2022 3.2 (L)  3.5 - 5.1 mmol/L Final   Chloride 02/19/2022 102  98 - 111 mmol/L Final   CO2 02/19/2022 30  22 - 32 mmol/L Final   Glucose, Bld 02/19/2022 111 (H)  70 - 99 mg/dL Final   Glucose reference range applies only to samples taken after fasting for at least 8 hours.   BUN 02/19/2022 7  6 - 20 mg/dL Final   Creatinine, Ser 02/19/2022 0.69  0.61 - 1.24 mg/dL  Final   Calcium 02/19/2022 9.0  8.9 - 10.3 mg/dL Final   Total Protein 02/19/2022 7.6  6.5 - 8.1 g/dL Final   Albumin 02/19/2022 4.6  3.5 - 5.0 g/dL Final   AST 02/19/2022 54 (H)  15 - 41 U/L Final   ALT 02/19/2022 43  0 - 44 U/L Final   Alkaline Phosphatase 02/19/2022 88  38 - 126 U/L Final   Total Bilirubin 02/19/2022 0.6  0.3 - 1.2 mg/dL Final   GFR, Estimated 02/19/2022 >60  >60 mL/min Final   Comment: (NOTE) Calculated using the CKD-EPI Creatinine Equation (2021)    Anion gap 02/19/2022 9  5 - 15 Final   Performed at [facility], [address] [place], [place] [address]   WBC 02/19/2022 6.8  4.0 - 10.5 K/uL Final   RBC 02/19/2022 4.81  4.22 - 5.81 MIL/uL Final   Hemoglobin 02/19/2022 16.0  13.0 - 17.0 g/dL Final   HCT 02/19/2022 47.0  39.0 - 52.0 % Final   MCV 02/19/2022 97.7  80.0 - 100.0 fL Final   MCH 02/19/2022 33.3  26.0 - 34.0 pg Final   MCHC 02/19/2022 34.0  30.0 - 36.0 g/dL Final   RDW 02/19/2022 13.5  11.5 - 15.5 % Final   Platelets 02/19/2022 242  150 - 400 K/uL Final   nRBC 02/19/2022 0.0  0.0 - 0.2 % Final   Neutrophils Relative % 02/19/2022 53  % Final   Neutro Abs 02/19/2022 3.6  1.7 - 7.7 K/uL Final   Lymphocytes Relative 02/19/2022 35  % Final   Lymphs Abs 02/19/2022 2.4  0.7 - 4.0 K/uL Final   Monocytes Relative 02/19/2022 9  % Final   Monocytes Absolute 02/19/2022 0.6  0.1 - 1.0 K/uL Final   Eosinophils Relative 02/19/2022 2  % Final   Eosinophils Absolute 02/19/2022 0.1  0.0 - 0.5 K/uL Final   Basophils Relative 02/19/2022 1  % Final   Basophils Absolute 02/19/2022 0.1  0.0 - 0.1 K/uL Final   Immature Granulocytes 02/19/2022 0  % Final   Abs Immature Granulocytes 02/19/2022 0.02  0.00 - 0.07 K/uL Final   Performed at [facility], [address] [place], [place] [address]   Alcohol, Ethyl (B) 02/19/2022 337 (HH)  <10 mg/dL Final   Comment: CRITICAL RESULT CALLED TO, READ BACK BY AND VERIFIED WITH: VALORIE POWELL,RN ON 02/19/22  AT 2259 BY JAG (NOTE) Lowest detectable limit for serum alcohol is 10 mg/dL.  For medical purposes only. Performed at [facility], [place],  [place], [place] [address]    Magnesium 02/19/2022 1.9  1.7 - 2.4 mg/dL Final   Performed at [facility], [address] [place], [place] [address]   Color, Urine 02/20/2022 YELLOW  YELLOW Final   APPearance 02/20/2022 CLEAR  CLEAR Final   Specific Gravity, Urine 02/20/2022 1.010  1.005 - 1.030 Final   pH 02/20/2022 6.5  5.0 - 8.0 Final   Glucose, UA 02/20/2022 NEGATIVE  NEGATIVE mg/dL Final   Hgb urine dipstick 02/20/2022 NEGATIVE  NEGATIVE Final   Bilirubin Urine 02/20/2022 NEGATIVE  NEGATIVE Final   Ketones, ur 02/20/2022 NEGATIVE  NEGATIVE mg/dL Final   Protein, ur 02/20/2022 NEGATIVE  NEGATIVE mg/dL Final   Nitrite 02/20/2022 NEGATIVE  NEGATIVE Final   Leukocytes,Ua 02/20/2022 NEGATIVE  NEGATIVE Final   Comment: Microscopic not done on urines with negative protein, blood, leukocytes, nitrite, or glucose < 500 mg/dL. Performed at [facility], [address] [place], [place] [address]    Lipase 02/19/2022 48  11 - 51 U/L Final   Performed at [facility], [address] [place], [place] [address]    Blood Alcohol level:  Lab Results  Component Value Date   ETH <10 07/26/2022   ETH 178 (H) 90/24/0973    Metabolic Disorder Labs: Lab Results  Component Value Date   HGBA1C 5.1 07/26/2022   MPG 99.67 07/26/2022   MPG 105.41 06/30/2022   No results found for: "PROLACTIN" Lab Results  Component Value Date   CHOL 236 (H) 06/30/2022   TRIG 110 06/30/2022   HDL 53 06/30/2022   CHOLHDL 4.5 06/30/2022   VLDL 22 06/30/2022   LDLCALC 161 (H) 06/30/2022    Therapeutic Lab Levels: No results found for: "LITHIUM" No results found for: "VALPROATE" No results found for: "CBMZ"  Physical Findings   AUDIT    Flowsheet Row Admission (Discharged) from 06/28/2022 in [facility] [facility]B  Alcohol Use Disorder Identification Test Final Score (AUDIT) 40      PHQ2-9    [place] ED from 07/26/2022 in [facility] ED from 11/18/2021 in [place]  PHQ-2 Total Score 5 3  PHQ-9 Total Score 14 10      Flowsheet Row ED from 07/26/2022 in [facility] ED from 07/16/2022 in [place] DEPT Admission (Discharged) from 06/28/2022 in [facility] 300B  C-SSRS RISK CATEGORY No Risk Error: Q3, 4, or 5 should not be populated when Q2 is No High Risk        Musculoskeletal  Strength & Muscle Tone: within normal limits Gait & Station: normal Patient leans: N/A  Psychiatric Specialty Exam  Presentation  General Appearance:  Appropriate for Environment  Eye Contact: Good  Speech: Clear and Coherent  Speech Volume: Normal  Handedness: Right   Mood and Affect  Mood: -- ("decent")  Affect: Appropriate   Thought Process  Thought Processes: Coherent  Descriptions of Associations:Intact  Orientation:Full (Time, Place and Person)  Thought Content:Logical  Diagnosis of Schizophrenia or Schizoaffective disorder in past: No    Hallucinations:Hallucinations: None  Ideas of Reference:None  Suicidal Thoughts:Suicidal Thoughts: No  Homicidal Thoughts:Homicidal Thoughts: No   Sensorium  Memory: Immediate Good; Recent Fair  Judgment: Fair  Insight: Good   Executive Functions  Concentration: Good  Attention Span: Good  Recall: Good  Fund of Knowledge: Good  Language: Good   Psychomotor Activity  Psychomotor Activity: Psychomotor Activity: Psychomotor Retardation   Assets  Assets: Desire for Improvement; Resilience   Sleep  Sleep: Sleep: Good   No data recorded  Physical Exam  Physical Exam HENT:     Head: Normocephalic and atraumatic.   Pulmonary:     Effort: Pulmonary effort is normal.  Neurological:     Mental Status: He is alert and oriented to person, place, and time.    Review of Systems  Psychiatric/Behavioral:  Negative for hallucinations and suicidal ideas. The patient does not have insomnia.    Blood pressure (!) 127/90, pulse 68, temperature 97.9 F (36.6 C), temperature source Oral, resp. rate 16, SpO2 99 %. There is no height or weight on file to calculate BMI.  Treatment Plan Summary: Daily contact with patient to assess and evaluate symptoms and progress in treatment and Medication management  Patient continues to improve, with good responses to mood stabilizing medication. Patient objectively appeared to have more affect today, and Patient subjectively endorsed feeling better than yesterday. Patient still having some cravings for Etoh, but will increase naltrexone tom to complete titration.   UDS+bzo BAL negative   AUD, severe  Daily drink about 1pint to 1liter daily, for past 3-4years, with last drink was 11/13. Precontemplative stage. Improved insight, patient now recognizing that he drank out of anger and to "cope" with this. Patient able to list positive coping skills and conceptualize that he needs to use these new skills. Amenable to residential rehab, has never been. CIWA with ativan PRN per protocol with thiamine & MV supplement Continue Naltrexone '25mg'$  and increase to '50mg'$  tom   MDD  Continued home cymbalta 60 mg daily Continued home seroquel 100 mg qHS   Tobacco use d/o Contemplative stage. Encouraged cessation NRTs   HTN Continued home amlodipine 10 mg daily   Considerations for follow-up: Recommend high risk screening every 6-12 months with HIV, RPR, hepatitis panel Recommend monitoring LFT every 6-12 months while on naltrexone   DISPO: IS amenable to residential rehab. Will attempt to dc patient door-to-door, however if unable will discuss with CD-IOP as bridge to rehab.   Tentative date: TBA Location: TBA  PGY-3 Freida Busman, MD 07/30/2022 11:56 AM

## 2022-07-31 MED ORDER — QUETIAPINE FUMARATE 100 MG PO TABS: 100.0000 mg | ORAL_TABLET | Freq: Every day | ORAL | 0 refills | Status: DC

## 2022-07-31 MED ORDER — AMLODIPINE BESYLATE 10 MG PO TABS: 10.0000 mg | ORAL_TABLET | Freq: Every day | ORAL | 0 refills | Status: DC

## 2022-07-31 MED ORDER — NICOTINE 21 MG/24HR TD PT24: 21.0000 mg | MEDICATED_PATCH | Freq: Every day | TRANSDERMAL | 0 refills | Status: DC

## 2022-07-31 MED ORDER — VITAMIN B-1 100 MG PO TABS: 100.0000 mg | ORAL_TABLET | Freq: Every day | ORAL | 0 refills | Status: AC

## 2022-07-31 MED ORDER — NALTREXONE HCL 50 MG PO TABS: 50.0000 mg | ORAL_TABLET | Freq: Every day | ORAL | 0 refills | Status: DC

## 2022-07-31 MED ORDER — ADULT MULTIVITAMIN W/MINERALS CH: 1.0000 | ORAL_TABLET | Freq: Every day | ORAL | 0 refills | Status: AC

## 2022-07-31 MED ORDER — DULOXETINE HCL 60 MG PO CPEP: 60.0000 mg | ORAL_CAPSULE | Freq: Every day | ORAL | 0 refills | Status: DC

## 2022-07-31 NOTE — ED Notes (Signed)
Pt sleeping@this time. Breathing even and unlabored. Will continue to monitor for safety 

## 2022-07-31 NOTE — ED Notes (Signed)
Pt is in the dayroom watching TV.  Respirations are even and unlabored. No acute distress noted. Will continue to monitor for safety. 

## 2022-07-31 NOTE — Discharge Planning (Signed)
Referral was received and per Sharyn Lull via voice message, patient has been accepted and can transfer to the facility on tomorrow by 9:00am. Update has been provided to the patient and MD made aware. Patient will need a 14-30 day supply of medication and one month refill. No nicotine gum allowed, however 14-30 day nicotine patches to be provided if needed. No other needs to report at this time.    LCSW will continue to follow up and provide updates as received.    Lucius Conn, LCSW Clinical Social Worker [facility] Ph: [phone]
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
## 2022-07-31 NOTE — ED Notes (Signed)
Pt sleeping in no acute distress. RR even and unlabored. Environment secured. Will continue to monitor for safety. 

## 2022-07-31 NOTE — ED Notes (Signed)
Pt is sleeping. No distress noted. Will continue to monitor safety. 

## 2022-07-31 NOTE — ED Notes (Signed)
No pain or discomfort noted/ reported. Pt alert, oriented, and ambulatory.  Breathing is even and  unlabored.  Will continue to monitor for safety.

## 2022-07-31 NOTE — ED Provider Notes (Signed)
Behavioral Health Progress Note  Date and Time: 07/31/2022 1:43 PM Name: Charles Graves MRN:  [id number]  Subjective:   Charles Graves is a 41 y.o. male, with PMH with AUD, MDD, tobacco use d/o, inpatient psych admission [facility] Oct 2023), remote suicide attempt, who presented voluntary to [facility] (07/26/2022) via [facility] for active SI, then admitted to [facility] (07/27/2022) for assistance with residential placement for AUD.     Patient reported having a good weekend, interacting with other patient's and staff members. His energy also seems to have improved as well. Reported that naltrexone has helped with his EtOH cravings, currently 5/10, with 10/10 being the worst. Stated that he is able to cope with the cravings with drinking cold water or cold showers. He is still amenable to residential rehab.  Stated that he feels that his mood is "good" with stable appetite and sleep. Did not having self-limiting jerking motions with limbs that last about 1 min. Initially started in one leg, patient would fall asleep, occurred in the opposite leg, patient would fall asleep, and same pattern would occur with his arms as well. This has never happened before. Denied feelings of muscle aches or restlessness. Denied pain.   Denied SI/HI/AVH. Contracted to safety. Stated that he has no social support to call on for, so discussed 988 and 911, which patient remembers from last week. Denied access to guns or weapons.   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 Family History: No family history on file. Family Psychiatric  History: See H&P Social History:  Social History   Substance and Sexual Activity  Alcohol Use Yes   Comment: state 1/2 pint/day     Social History   Substance and Sexual Activity  Drug Use Not Currently    Social History   Socioeconomic History   Marital status: Single    Spouse name: Not on file   Number of children: Not on file   Years of education: Not on file   Highest education level: Not on file  Occupational History   Not on file  Tobacco Use   Smoking status: Every Day    Packs/day: 0.50    Types: Cigarettes   Smokeless tobacco: Never  Vaping Use   Vaping Use: Never used  Substance and Sexual Activity   Alcohol use: Yes    Comment: state 1/2 pint/day   Drug use: Not Currently   Sexual activity: Not Currently  Other Topics Concern   Not on file  Social History Narrative   Not on file   Social Determinants of Health   Financial Resource Strain: Not on file  Food Insecurity: Food Insecurity Present (06/28/2022)   Hunger Vital Sign    Worried About Running Out of Food in the Last Year: Sometimes true    Ran Out of Food in the Last Year: Sometimes true  Transportation Needs: Unmet Transportation Needs (06/28/2022)   PRAPARE - Hydrologist (Medical): Yes  Lack of Transportation (Non-Medical): Yes  Physical Activity: Not on file  Stress: Not on file  Social Connections: Not on file   SDOH:  [place]: Food Insecurity Present (06/28/2022)  Housing: High Risk (06/28/2022)  Transportation Needs: Unmet Transportation Needs (06/28/2022)  Utilities: Not At Risk (06/28/2022)  Alcohol Screen: High Risk (06/28/2022)  Depression (PHQ2-9): High Risk (07/29/2022)  Tobacco Use: High Risk (07/28/2022)   Additional Social History:    Pain Medications: Unknown Prescriptions: Seroquel, Trazadone and Cymbalta Over the  Counter: Unknown History of alcohol / drug use?: Yes Longest period of sobriety (when/how long): A few days Negative Consequences of Use:  (Unknown) Withdrawal Symptoms: Tremors Name of Substance 1: Alcohol 1 - Age of First Use: 8 1 - Amount (size/oz): "a pint or two" 1 - Frequency: Daily 1 - Last Use / Amount: 2 days ago 1 - Method of Aquiring: Self 1- Route of Use: Orally                  Current Medications:  Current Facility-Administered Medications  Medication Dose Route Frequency Provider Last Rate Last Admin   acetaminophen (TYLENOL) tablet 650 mg  650 mg Oral Q6H PRN [name], NP       alum & mag hydroxide-simeth (MAALOX/MYLANTA) 200-200-20 MG/5ML suspension 30 mL  30 mL Oral Q4H PRN [name], NP       amLODipine (NORVASC) tablet 10 mg  10 mg Oral QHS Merrily Brittle, DO   10 mg at 07/30/22 2132   dicyclomine (BENTYL) tablet 20 mg  20 mg Oral Q6H PRN [name], NP       DULoxetine (CYMBALTA) DR capsule 60 mg  60 mg Oral QHS Merrily Brittle, DO   60 mg at 07/30/22 2132   hydrOXYzine (ATARAX) tablet 25 mg  25 mg Oral Q6H PRN [name], NP       loperamide (IMODIUM) capsule 2-4 mg  2-4 mg Oral PRN [name], NP       magnesium hydroxide (MILK OF MAGNESIA) suspension 30 mL  30 mL Oral Daily PRN [name], NP       methocarbamol (ROBAXIN) tablet 500 mg  500 mg Oral Q8H PRN [name], NP       multivitamin with minerals tablet 1 tablet  1 tablet Oral QHS Merrily Brittle, DO   1 tablet at 07/30/22 2132   naltrexone (DEPADE) tablet 50 mg  50 mg Oral Daily Damita Dunnings B, MD   50 mg at 07/31/22 4650   naproxen (NAPROSYN) tablet 500 mg  500 mg Oral BID PRN [name], NP       nicotine (NICODERM CQ - dosed in mg/24 hours) patch 21 mg  21 mg Transdermal Daily Merrily Brittle, DO   21 mg at 07/31/22 3546   nicotine polacrilex (NICORETTE) gum 4 mg  4 mg Oral PRN Merrily Brittle, DO       ondansetron (ZOFRAN-ODT) disintegrating tablet 4 mg  4 mg Oral Q6H PRN  [name], NP       QUEtiapine (SEROQUEL) tablet 100 mg  100 mg Oral QHS [name], NP   100 mg at 07/30/22 2132   thiamine (VITAMIN B1) tablet 100 mg  100 mg Oral QHS Merrily Brittle, DO   100 mg at 07/30/22 2132   traZODone (DESYREL) tablet 50 mg  50 mg Oral QHS PRN [name], NP       Current Outpatient Medications  Medication Sig Dispense Refill  amLODipine (NORVASC) 10 MG tablet Take 1 tablet (10 mg total) by mouth daily. 30 tablet 0   DULoxetine (CYMBALTA) 60 MG capsule Take 1 capsule (60 mg total) by mouth daily. 30 capsule 0   ondansetron (ZOFRAN-ODT) 4 MG disintegrating tablet Take 4 mg by mouth every 8 (eight) hours as needed for nausea or vomiting.     QUEtiapine (SEROQUEL) 100 MG tablet Take 1 tablet (100 mg total) by mouth at bedtime. 30 tablet 0    Labs  Lab Results:  Admission on 07/26/2022  Component Date Value Ref Range Status   SARS Coronavirus 2 by RT PCR 07/26/2022 NEGATIVE  NEGATIVE Final   Comment: (NOTE) SARS-CoV-2 target nucleic acids are NOT DETECTED.  The SARS-CoV-2 RNA is generally detectable in upper respiratory specimens during the acute phase of infection. The lowest concentration of SARS-CoV-2 viral copies this assay can detect is 138 copies/mL. A negative result does not preclude SARS-Cov-2 infection and should not be used as the sole basis for treatment or other patient management decisions. A negative result may occur with  improper specimen collection/handling, submission of specimen other than nasopharyngeal swab, presence of viral mutation(s) within the areas targeted by this assay, and inadequate number of viral copies(<138 copies/mL). A negative result must be combined with clinical observations, patient history, and epidemiological information. The expected result is Negative.  Fact Sheet for Patients:  EntrepreneurPulse.com.au  Fact Sheet for Healthcare Providers:   IncredibleEmployment.be  This test is no                          t yet approved or cleared by the [place] FDA and  has been authorized for detection and/or diagnosis of SARS-CoV-2 by FDA under an Emergency Use Authorization (EUA). This EUA will remain  in effect (meaning this test can be used) for the duration of the COVID-19 declaration under Section 564(b)(1) of the Act, 21 U.S.C.section 360bbb-3(b)(1), unless the authorization is terminated  or revoked sooner.       Influenza A by PCR 07/26/2022 NEGATIVE  NEGATIVE Final   Influenza B by PCR 07/26/2022 NEGATIVE  NEGATIVE Final   Comment: (NOTE) The Xpert Xpress SARS-CoV-2/FLU/RSV plus assay is intended as an aid in the diagnosis of influenza from Nasopharyngeal swab specimens and should not be used as a sole basis for treatment. Nasal washings and aspirates are unacceptable for Xpert Xpress SARS-CoV-2/FLU/RSV testing.  Fact Sheet for Patients: EntrepreneurPulse.com.au  Fact Sheet for Healthcare Providers: IncredibleEmployment.be  This test is not yet approved or cleared by the [place] FDA and has been authorized for detection and/or diagnosis of SARS-CoV-2 by FDA under an Emergency Use Authorization (EUA). This EUA will remain in effect (meaning this test can be used) for the duration of the COVID-19 declaration under Section 564(b)(1) of the Act, 21 U.S.C. section 360bbb-3(b)(1), unless the authorization is terminated or revoked.  Performed at [facility], [place] [address] [place], [place] [address]    WBC 07/26/2022 8.2  4.0 - 10.5 K/uL Final   RBC 07/26/2022 5.41  4.22 - 5.81 MIL/uL Final   Hemoglobin 07/26/2022 17.3 (H)  13.0 - 17.0 g/dL Final   HCT 07/26/2022 51.9  39.0 - 52.0 % Final   MCV 07/26/2022 95.9  80.0 - 100.0 fL Final   MCH 07/26/2022 32.0  26.0 - 34.0 pg Final   MCHC 07/26/2022 33.3  30.0 - 36.0 g/dL Final   RDW 07/26/2022 13.1   11.5 -  15.5 % Final   Platelets 07/26/2022 277  150 - 400 K/uL Final   nRBC 07/26/2022 0.0  0.0 - 0.2 % Final   Neutrophils Relative % 07/26/2022 69  % Final   Neutro Abs 07/26/2022 5.7  1.7 - 7.7 K/uL Final   Lymphocytes Relative 07/26/2022 21  % Final   Lymphs Abs 07/26/2022 1.7  0.7 - 4.0 K/uL Final   Monocytes Relative 07/26/2022 7  % Final   Monocytes Absolute 07/26/2022 0.6  0.1 - 1.0 K/uL Final   Eosinophils Relative 07/26/2022 2  % Final   Eosinophils Absolute 07/26/2022 0.1  0.0 - 0.5 K/uL Final   Basophils Relative 07/26/2022 1  % Final   Basophils Absolute 07/26/2022 0.1  0.0 - 0.1 K/uL Final   Immature Granulocytes 07/26/2022 0  % Final   Abs Immature Granulocytes 07/26/2022 0.02  0.00 - 0.07 K/uL Final   Performed at [facility], [place] [address], [place], [place] [address]   Sodium 07/26/2022 141  135 - 145 mmol/L Final   Potassium 07/26/2022 3.7  3.5 - 5.1 mmol/L Final   Chloride 07/26/2022 102  98 - 111 mmol/L Final   CO2 07/26/2022 26  22 - 32 mmol/L Final   Glucose, Bld 07/26/2022 96  70 - 99 mg/dL Final   Glucose reference range applies only to samples taken after fasting for at least 8 hours.   BUN 07/26/2022 9  6 - 20 mg/dL Final   Creatinine, Ser 07/26/2022 0.81  0.61 - 1.24 mg/dL Final   Calcium 07/26/2022 9.9  8.9 - 10.3 mg/dL Final   Total Protein 07/26/2022 7.4  6.5 - 8.1 g/dL Final   Albumin 07/26/2022 4.7  3.5 - 5.0 g/dL Final   AST 07/26/2022 18  15 - 41 U/L Final   ALT 07/26/2022 16  0 - 44 U/L Final   Alkaline Phosphatase 07/26/2022 92  38 - 126 U/L Final   Total Bilirubin 07/26/2022 0.6  0.3 - 1.2 mg/dL Final   GFR, Estimated 07/26/2022 >60  >60 mL/min Final   Comment: (NOTE) Calculated using the CKD-EPI Creatinine Equation (2021)    Anion gap 07/26/2022 13  5 - 15 Final   Performed at [facility] [address], [place], [place] [address]   Hgb A1c MFr Bld 07/26/2022 5.1  4.8 - 5.6 % Final   Comment: (NOTE) Pre diabetes:           5.7%-6.4%  Diabetes:              >6.4%  Glycemic control for   <7.0% adults with diabetes    Mean Plasma Glucose 07/26/2022 99.67  mg/dL Final   Performed at [facility], [place] [address], [place], [place] [address]   Alcohol, Ethyl (B) 07/26/2022 <10  <10 mg/dL Final   Comment: (NOTE) Lowest detectable limit for serum alcohol is 10 mg/dL.  For medical purposes only. Performed at [facility], [place] [address], [place], [place] [address]    POC Amphetamine UR 07/27/2022 None Detected  NONE DETECTED (Cut Off Level 1000 ng/mL) Final   POC Secobarbital (BAR) 07/27/2022 None Detected  NONE DETECTED (Cut Off Level 300 ng/mL) Final   POC Buprenorphine (BUP) 07/27/2022 None Detected  NONE DETECTED (Cut Off Level 10 ng/mL) Final   POC Oxazepam (BZO) 07/27/2022 Positive (A)  NONE DETECTED (Cut Off Level 300 ng/mL) Final   POC Cocaine UR 07/27/2022 None Detected  NONE DETECTED (Cut Off Level  300 ng/mL) Final   POC Methamphetamine UR 07/27/2022 None Detected  NONE DETECTED (Cut Off Level 1000 ng/mL) Final   POC Morphine 07/27/2022 None Detected  NONE DETECTED (Cut Off Level 300 ng/mL) Final   POC Methadone UR 07/27/2022 None Detected  NONE DETECTED (Cut Off Level 300 ng/mL) Final   POC Oxycodone UR 07/27/2022 None Detected  NONE DETECTED (Cut Off Level 100 ng/mL) Final   POC Marijuana UR 07/27/2022 None Detected  NONE DETECTED (Cut Off Level 50 ng/mL) Final   SARSCOV2ONAVIRUS 2 AG 07/27/2022 NEGATIVE  NEGATIVE Final   Comment: (NOTE) SARS-CoV-2 antigen NOT DETECTED.   Negative results are presumptive.  Negative results do not preclude SARS-CoV-2 infection and should not be used as the sole basis for treatment or other patient management decisions, including infection  control decisions, particularly in the presence of clinical signs and  symptoms consistent with COVID-19, or in those who have been in contact with the virus.  Negative results must be combined with clinical  observations, patient history, and epidemiological information. The expected result is Negative.  Fact Sheet for Patients: HandmadeRecipes.com.cy  Fact Sheet for Healthcare Providers: FuneralLife.at  This test is not yet approved or cleared by the [place] FDA and  has been authorized for detection and/or diagnosis of SARS-CoV-2 by FDA under an Emergency Use Authorization (EUA).  This EUA will remain in effect (meaning this test can be used) for the duration of  the COV                          ID-19 declaration under Section 564(b)(1) of the Act, 21 U.S.C. section 360bbb-3(b)(1), unless the authorization is terminated or revoked sooner.     TSH 07/26/2022 1.404  0.350 - 4.500 uIU/mL Final   Comment: Performed by a 3rd Generation assay with a functional sensitivity of <=0.01 uIU/mL. Performed at [facility], [place] [address], [place], [place] [address]   Admission on 07/16/2022, Discharged on 07/16/2022  Component Date Value Ref Range Status   WBC 07/16/2022 7.5  4.0 - 10.5 K/uL Final   RBC 07/16/2022 4.74  4.22 - 5.81 MIL/uL Final   Hemoglobin 07/16/2022 15.3  13.0 - 17.0 g/dL Final   HCT 07/16/2022 44.5  39.0 - 52.0 % Final   MCV 07/16/2022 93.9  80.0 - 100.0 fL Final   MCH 07/16/2022 32.3  26.0 - 34.0 pg Final   MCHC 07/16/2022 34.4  30.0 - 36.0 g/dL Final   RDW 07/16/2022 13.2  11.5 - 15.5 % Final   Platelets 07/16/2022 249  150 - 400 K/uL Final   nRBC 07/16/2022 0.0  0.0 - 0.2 % Final   Neutrophils Relative % 07/16/2022 63  % Final   Neutro Abs 07/16/2022 4.7  1.7 - 7.7 K/uL Final   Lymphocytes Relative 07/16/2022 28  % Final   Lymphs Abs 07/16/2022 2.1  0.7 - 4.0 K/uL Final   Monocytes Relative 07/16/2022 7  % Final   Monocytes Absolute 07/16/2022 0.5  0.1 - 1.0 K/uL Final   Eosinophils Relative 07/16/2022 2  % Final   Eosinophils Absolute 07/16/2022 0.2  0.0 - 0.5 K/uL Final   Basophils Relative 07/16/2022 0  %  Final   Basophils Absolute 07/16/2022 0.0  0.0 - 0.1 K/uL Final   Immature Granulocytes 07/16/2022 0  % Final   Abs Immature Granulocytes 07/16/2022 0.02  0.00 - 0.07 K/uL Final   Performed at [facility], [address] [place], [place] [address]   Sodium 07/16/2022 140  135 - 145 mmol/L Final   Potassium 07/16/2022 3.2 (L)  3.5 - 5.1 mmol/L Final   Chloride 07/16/2022 104  98 - 111 mmol/L Final   CO2 07/16/2022 24  22 - 32 mmol/L Final   Glucose, Bld 07/16/2022 115 (H)  70 - 99 mg/dL Final   Glucose reference range applies only to samples taken after fasting for at least 8 hours.   BUN 07/16/2022 8  6 - 20 mg/dL Final   Creatinine, Ser 07/16/2022 0.76  0.61 - 1.24 mg/dL Final   Calcium 07/16/2022 8.9  8.9 - 10.3 mg/dL Final   Total Protein 07/16/2022 6.8  6.5 - 8.1 g/dL Final   Albumin 07/16/2022 4.0  3.5 - 5.0 g/dL Final   AST 07/16/2022 28  15 - 41 U/L Final   ALT 07/16/2022 18  0 - 44 U/L Final   Alkaline Phosphatase 07/16/2022 64  38 - 126 U/L Final   Total Bilirubin 07/16/2022 0.7  0.3 - 1.2 mg/dL Final   GFR, Estimated 07/16/2022 >60  >60 mL/min Final   Comment: (NOTE) Calculated using the CKD-EPI Creatinine Equation (2021)    Anion gap 07/16/2022 12  5 - 15 Final   Performed at [facility], [place] [address] [place], [place] [address]   Alcohol, Ethyl (B) 07/16/2022 178 (H)  <10 mg/dL Final   Comment: (NOTE) Lowest detectable limit for serum alcohol is 10 mg/dL.  For medical purposes only. Performed at [facility], [place] [address] [place], [place] [address]   Admission on 06/28/2022, Discharged on 07/03/2022  Component Date Value Ref Range Status   Hgb A1c MFr Bld 06/30/2022 5.3  4.8 - 5.6 % Final   Comment: (NOTE) Pre diabetes:          5.7%-6.4%  Diabetes:              >6.4%  Glycemic control for   <7.0% adults with diabetes    Mean Plasma Glucose 06/30/2022 105.41  mg/dL Final   Performed at [facility] [address], [place], [place] [address]   Cholesterol 06/30/2022 236 (H)  0 - 200 mg/dL Final   Triglycerides 06/30/2022 110  <150 mg/dL Final   HDL 06/30/2022 53  >40 mg/dL Final   Total CHOL/HDL Ratio 06/30/2022 4.5  RATIO Final   VLDL 06/30/2022 22  0 - 40 mg/dL Final   LDL Cholesterol 06/30/2022 161 (H)  0 - 99 mg/dL Final   Comment:        Total Cholesterol/HDL:CHD Risk Coronary Heart Disease Risk Table                     Men   Women  1/2 Average Risk   3.4   3.3  Average Risk       5.0   4.4  2 X Average Risk   9.6   7.1  3 X Average Risk  23.4   11.0        Use the calculated Patient Ratio above and the CHD Risk Table to determine the patient's CHD Risk.        ATP III CLASSIFICATION (LDL):  <100     mg/dL   Optimal  100-129  mg/dL   Near or Above                    Optimal  130-159  mg/dL   Borderline  160-189  mg/dL   High  >190     mg/dL   Very High Performed at [facility] [address], [place], [place] [address]    Potassium 06/30/2022 3.6  3.5 - 5.1 mmol/L Final   Performed at [facility] [address] [place], [place] [address]   Magnesium 07/02/2022 2.4  1.7 - 2.4 mg/dL Final   Performed at [facility] [address] [place], [place] [address]   Sodium 07/02/2022 139  135 - 145 mmol/L Final   Potassium 07/02/2022 3.8  3.5 - 5.1 mmol/L Final   Chloride 07/02/2022 107  98 - 111 mmol/L Final   CO2 07/02/2022 25  22 - 32 mmol/L Final   Glucose, Bld 07/02/2022 97  70 - 99 mg/dL Final   Glucose reference range applies only to samples taken after fasting for at least 8 hours.   BUN 07/02/2022 19  6 - 20 mg/dL Final   Creatinine, Ser 07/02/2022 0.73  0.61 - 1.24 mg/dL Final   Calcium 07/02/2022 9.0  8.9 - 10.3 mg/dL Final   GFR, Estimated 07/02/2022 >60  >60 mL/min Final   Comment: (NOTE) Calculated using the CKD-EPI Creatinine Equation (2021)    Anion gap 07/02/2022 7  5 - 15  Final   Performed at [facility], [place] [address] [place], [place] [address]  Admission on 06/27/2022, Discharged on 06/28/2022  Component Date Value Ref Range Status   Sodium 06/27/2022 143  135 - 145 mmol/L Final   Potassium 06/27/2022 3.4 (L)  3.5 - 5.1 mmol/L Final   Chloride 06/27/2022 107  98 - 111 mmol/L Final   CO2 06/27/2022 26  22 - 32 mmol/L Final   Glucose, Bld 06/27/2022 96  70 - 99 mg/dL Final   Glucose reference range applies only to samples taken after fasting for at least 8 hours.   BUN 06/27/2022 6  6 - 20 mg/dL Final   Creatinine, Ser 06/27/2022 0.76  0.61 - 1.24 mg/dL Final   Calcium 06/27/2022 8.7 (L)  8.9 - 10.3 mg/dL Final   Total Protein 06/27/2022 7.5  6.5 - 8.1 g/dL Final   Albumin 06/27/2022 4.4  3.5 - 5.0 g/dL Final   AST 06/27/2022 20  15 - 41 U/L Final   ALT 06/27/2022 17  0 - 44 U/L Final   Alkaline Phosphatase 06/27/2022 82  38 - 126 U/L Final   Total Bilirubin 06/27/2022 0.5  0.3 - 1.2 mg/dL Final   GFR, Estimated 06/27/2022 >60  >60 mL/min Final   Comment: (NOTE) Calculated using the CKD-EPI Creatinine Equation (2021)    Anion gap 06/27/2022 10  5 - 15 Final   Performed at [facility], [place] [address], [place], [place] [address]   Alcohol, Ethyl (B) 06/27/2022 344 (HH)  <10 mg/dL Final   Comment: CRITICAL RESULT CALLED TO, READ BACK BY AND VERIFIED WITH GRAY,S RN @ [phone] ON 1017 BY MAHMOUD,S (NOTE) Lowest detectable limit for serum alcohol is 10 mg/dL.  For medical purposes only. Performed at [facility], [place] [address], [place], [place] [address]    Salicylate Lvl 36/14/4315 <7.0 (L)  7.0 - 30.0 mg/dL Final   Performed at [facility] [address], [place], [place] [address]   Acetaminophen (Tylenol), Serum 06/27/2022 <10 (L)  10 - 30 ug/mL Final   Comment: (NOTE) Therapeutic concentrations vary significantly. A range of 10-30  ug/mL  may be an effective  concentration for many patients. However, some  are best treated at concentrations outside of this range. Acetaminophen concentrations >150 ug/mL at 4 hours after ingestion  and >50 ug/mL at 12 hours after ingestion are often associated with  toxic reactions.  Performed at [facility], [place] [address], [place], [place] [address]    WBC 06/27/2022 6.8  4.0 - 10.5 K/uL Final   RBC 06/27/2022 4.94  4.22 - 5.81 MIL/uL Final   Hemoglobin 06/27/2022 15.9  13.0 - 17.0 g/dL Final   HCT 06/27/2022 48.0  39.0 - 52.0 % Final   MCV 06/27/2022 97.2  80.0 - 100.0 fL Final   MCH 06/27/2022 32.2  26.0 - 34.0 pg Final   MCHC 06/27/2022 33.1  30.0 - 36.0 g/dL Final   RDW 06/27/2022 13.3  11.5 - 15.5 % Final   Platelets 06/27/2022 231  150 - 400 K/uL Final   nRBC 06/27/2022 0.0  0.0 - 0.2 % Final   Performed at [facility], [place] [address] [place], [place] [address]   Opiates 06/27/2022 NONE DETECTED  NONE DETECTED Final   Cocaine 06/27/2022 NONE DETECTED  NONE DETECTED Final   Benzodiazepines 06/27/2022 NONE DETECTED  NONE DETECTED Final   Amphetamines 06/27/2022 NONE DETECTED  NONE DETECTED Final   Tetrahydrocannabinol 06/27/2022 POSITIVE (A)  NONE DETECTED Final   Barbiturates 06/27/2022 NONE DETECTED  NONE DETECTED Final   Comment: (NOTE) DRUG SCREEN FOR MEDICAL PURPOSES ONLY.  IF CONFIRMATION IS NEEDED FOR ANY PURPOSE, NOTIFY LAB WITHIN 5 DAYS.  LOWEST DETECTABLE LIMITS FOR URINE DRUG SCREEN Drug Class                     Cutoff (ng/mL) Amphetamine and metabolites    1000 Barbiturate and metabolites    200 Benzodiazepine                 200 Opiates and metabolites        300 Cocaine and metabolites        300 THC                            50 Performed at [facility], [place] [address] [place], [place] [address]    SARS Coronavirus 2 by RT PCR 06/27/2022 NEGATIVE  NEGATIVE Final   Comment: (NOTE) SARS-CoV-2 target nucleic acids  are NOT DETECTED.  The SARS-CoV-2 RNA is generally detectable in upper respiratory specimens during the acute phase of infection. The lowest concentration of SARS-CoV-2 viral copies this assay can detect is 138 copies/mL. A negative result does not preclude SARS-Cov-2 infection and should not be used as the sole basis for treatment or other patient management decisions. A negative result may occur with  improper specimen collection/handling, submission of specimen other than nasopharyngeal swab, presence of viral mutation(s) within the areas targeted by this assay, and inadequate number of viral copies(<138 copies/mL). A negative result must be combined with clinical observations, patient history, and epidemiological information. The expected result is Negative.  Fact Sheet for Patients:  EntrepreneurPulse.com.au  Fact Sheet for Healthcare Providers:  IncredibleEmployment.be  This test is no                          t yet approved or cleared by the [place] FDA and  has been authorized for detection and/or diagnosis of SARS-CoV-2  by FDA under an Emergency Use Authorization (EUA). This EUA will remain  in effect (meaning this test can be used) for the duration of the COVID-19 declaration under Section 564(b)(1) of the Act, 21 U.S.C.section 360bbb-3(b)(1), unless the authorization is terminated  or revoked sooner.       Influenza A by PCR 06/27/2022 NEGATIVE  NEGATIVE Final   Influenza B by PCR 06/27/2022 NEGATIVE  NEGATIVE Final   Comment: (NOTE) The Xpert Xpress SARS-CoV-2/FLU/RSV plus assay is intended as an aid in the diagnosis of influenza from Nasopharyngeal swab specimens and should not be used as a sole basis for treatment. Nasal washings and aspirates are unacceptable for Xpert Xpress SARS-CoV-2/FLU/RSV testing.  Fact Sheet for Patients: EntrepreneurPulse.com.au  Fact Sheet for Healthcare  Providers: IncredibleEmployment.be  This test is not yet approved or cleared by the [place] FDA and has been authorized for detection and/or diagnosis of SARS-CoV-2 by FDA under an Emergency Use Authorization (EUA). This EUA will remain in effect (meaning this test can be used) for the duration of the COVID-19 declaration under Section 564(b)(1) of the Act, 21 U.S.C. section 360bbb-3(b)(1), unless the authorization is terminated or revoked.  Performed at [facility], [place] [address] [place], [place] [address]   Admission on 06/17/2022, Discharged on 06/17/2022  Component Date Value Ref Range Status   Alcohol, Ethyl (B) 06/17/2022 209 (H)  <10 mg/dL Final   Comment: (NOTE) Lowest detectable limit for serum alcohol is 10 mg/dL.  For medical purposes only. Performed at [facility], [address] [place], [place] [address]    Prothrombin Time 06/17/2022 12.8  11.4 - 15.2 seconds Final   INR 06/17/2022 1.0  0.8 - 1.2 Final   Comment: (NOTE) INR goal varies based on device and disease states. Performed at [facility], [address] [place], [place] [address]    aPTT 06/17/2022 28  24 - 36 seconds Final   Performed at [facility], [address] [place], [place] [address]   WBC 06/17/2022 6.9  4.0 - 10.5 K/uL Final   RBC 06/17/2022 4.99  4.22 - 5.81 MIL/uL Final   Hemoglobin 06/17/2022 15.9  13.0 - 17.0 g/dL Final   HCT 06/17/2022 47.3  39.0 - 52.0 % Final   MCV 06/17/2022 94.8  80.0 - 100.0 fL Final   MCH 06/17/2022 31.9  26.0 - 34.0 pg Final   MCHC 06/17/2022 33.6  30.0 - 36.0 g/dL Final   RDW 06/17/2022 12.8  11.5 - 15.5 % Final   Platelets 06/17/2022 262  150 - 400 K/uL Final   nRBC 06/17/2022 0.0  0.0 - 0.2 % Final   Performed at [facility], [address] [place], [place] [address]   Neutrophils Relative % 06/17/2022 55  % Final   Neutro Abs 06/17/2022 3.9  1.7 - 7.7 K/uL  Final   Lymphocytes Relative 06/17/2022 33  % Final   Lymphs Abs 06/17/2022 2.3  0.7 - 4.0 K/uL Final   Monocytes Relative 06/17/2022 7  % Final   Monocytes Absolute 06/17/2022 0.5  0.1 - 1.0 K/uL Final   Eosinophils Relative 06/17/2022 4  % Final   Eosinophils Absolute 06/17/2022 0.2  0.0 - 0.5 K/uL Final   Basophils Relative 06/17/2022 1  % Final   Basophils Absolute 06/17/2022 0.1  0.0 - 0.1 K/uL Final   Immature Granulocytes 06/17/2022 0  % Final   Abs Immature Granulocytes 06/17/2022 0.01  0.00 - 0.07 K/uL Final   Performed at [facility], [address] [place], [place] [address]   Sodium 06/17/2022 141  135 - 145 mmol/L Final   Potassium 06/17/2022 3.3 (L)  3.5 - 5.1 mmol/L Final   Chloride 06/17/2022 105  98 - 111 mmol/L Final   CO2 06/17/2022 27  22 - 32 mmol/L Final   Glucose, Bld 06/17/2022 96  70 - 99 mg/dL Final   Glucose reference range applies only to samples taken after fasting for at least 8 hours.   BUN 06/17/2022 7  6 - 20 mg/dL Final   Creatinine, Ser 06/17/2022 0.74  0.61 - 1.24 mg/dL Final   Calcium 06/17/2022 8.6 (L)  8.9 - 10.3 mg/dL Final   Total Protein 06/17/2022 7.2  6.5 - 8.1 g/dL Final   Albumin 06/17/2022 4.1  3.5 - 5.0 g/dL Final   AST 06/17/2022 28  15 - 41 U/L Final   ALT 06/17/2022 32  0 - 44 U/L Final   Alkaline Phosphatase 06/17/2022 83  38 - 126 U/L Final   Total Bilirubin 06/17/2022 0.5  0.3 - 1.2 mg/dL Final   GFR, Estimated 06/17/2022 >60  >60 mL/min Final   Comment: (NOTE) Calculated using the CKD-EPI Creatinine Equation (2021)    Anion gap 06/17/2022 9  5 - 15 Final   Performed at [facility], [address] [place], [place] [address]   Glucose-Capillary 06/17/2022 90  70 - 99 mg/dL Final   Glucose reference range applies only to samples taken after fasting for at least 8 hours.  Admission on 05/02/2022, Discharged on 05/03/2022  Component Date Value Ref Range Status   Sodium 05/02/2022 146 (H)  135 - 145 mmol/L  Final   Potassium 05/02/2022 3.9  3.5 - 5.1 mmol/L Final   Chloride 05/02/2022 106  98 - 111 mmol/L Final   CO2 05/02/2022 29  22 - 32 mmol/L Final   Glucose, Bld 05/02/2022 98  70 - 99 mg/dL Final   Glucose reference range applies only to samples taken after fasting for at least 8 hours.   BUN 05/02/2022 5 (L)  6 - 20 mg/dL Final   Creatinine, Ser 05/02/2022 0.81  0.61 - 1.24 mg/dL Final   Calcium 05/02/2022 8.9  8.9 - 10.3 mg/dL Final   Total Protein 05/02/2022 6.8  6.5 - 8.1 g/dL Final   Albumin 05/02/2022 4.2  3.5 - 5.0 g/dL Final   AST 05/02/2022 39  15 - 41 U/L Final   ALT 05/02/2022 35  0 - 44 U/L Final   Alkaline Phosphatase 05/02/2022 74  38 - 126 U/L Final   Total Bilirubin 05/02/2022 0.6  0.3 - 1.2 mg/dL Final   GFR, Estimated 05/02/2022 >60  >60 mL/min Final   Comment: (NOTE) Calculated using the CKD-EPI Creatinine Equation (2021)    Anion gap 05/02/2022 11  5 - 15 Final   Performed at [facility] [address] [place], [place] [address]   Alcohol, Ethyl (B) 05/02/2022 327 (HH)  <10 mg/dL Final   Comment: CRITICAL RESULT CALLED TO, READ BACK BY AND VERIFIED WITH K.MUNNETT, RN [id number] 08.23.23 MRIVET (NOTE) Lowest detectable limit for serum alcohol is 10 mg/dL.  For medical purposes only. Performed at [facility], [place] [address] [place], [place] [address]    WBC 05/02/2022 5.5  4.0 - 10.5 K/uL Final   RBC 05/02/2022 4.81  4.22 - 5.81 MIL/uL Final  Hemoglobin 05/02/2022 15.7  13.0 - 17.0 g/dL Final   HCT 05/02/2022 47.1  39.0 - 52.0 % Final   MCV 05/02/2022 97.9  80.0 - 100.0 fL Final   MCH 05/02/2022 32.6  26.0 - 34.0 pg Final   MCHC 05/02/2022 33.3  30.0 - 36.0 g/dL Final   RDW 05/02/2022 12.3  11.5 - 15.5 % Final   Platelets 05/02/2022 233  150 - 400 K/uL Final   nRBC 05/02/2022 0.0  0.0 - 0.2 % Final   Neutrophils Relative % 05/02/2022 49  % Final   Neutro Abs 05/02/2022 2.6  1.7 - 7.7 K/uL Final   Lymphocytes Relative 05/02/2022 42  % Final    Lymphs Abs 05/02/2022 2.3  0.7 - 4.0 K/uL Final   Monocytes Relative 05/02/2022 6  % Final   Monocytes Absolute 05/02/2022 0.3  0.1 - 1.0 K/uL Final   Eosinophils Relative 05/02/2022 2  % Final   Eosinophils Absolute 05/02/2022 0.1  0.0 - 0.5 K/uL Final   Basophils Relative 05/02/2022 1  % Final   Basophils Absolute 05/02/2022 0.1  0.0 - 0.1 K/uL Final   Immature Granulocytes 05/02/2022 0  % Final   Abs Immature Granulocytes 05/02/2022 0.01  0.00 - 0.07 K/uL Final   Performed at [facility], [place] [address], [place], [place] [address]   Lipase 05/02/2022 44  11 - 51 U/L Final   Performed at [place] [address] [place], [place] [address]   Troponin I (High Sensitivity) 05/02/2022 5  <18 ng/L Final   Comment: (NOTE) Elevated high sensitivity troponin I (hsTnI) values and significant  changes across serial measurements may suggest ACS but many other  chronic and acute conditions are known to elevate hsTnI results.  Refer to the "Links" section for chest pain algorithms and additional  guidance. Performed at [facility], [place] [address], [place], [place] [address]    Ammonia 05/02/2022 20  9 - 35 umol/L Final   Performed at [facility] [address], [place], [place] [address]   Glucose-Capillary 05/02/2022 86  70 - 99 mg/dL Final   Glucose reference range applies only to samples taken after fasting for at least 8 hours.   Comment 1 05/02/2022 Notify RN   Final   Comment 2 05/02/2022 Document in Chart   Final   Troponin I (High Sensitivity) 05/03/2022 4  <18 ng/L Final   Comment: (NOTE) Elevated high sensitivity troponin I (hsTnI) values and significant  changes across serial measurements may suggest ACS but many other  chronic and acute conditions are known to elevate hsTnI results.  Refer to the "Links" section for chest pain algorithms and additional  guidance. Performed at [facility], [place] [address] [place], [place] [address]    Admission on 04/09/2022, Discharged on 04/09/2022  Component Date Value Ref Range Status   WBC 04/09/2022 4.4  4.0 - 10.5 K/uL Final   RBC 04/09/2022 4.67  4.22 - 5.81 MIL/uL Final   Hemoglobin 04/09/2022 15.5  13.0 - 17.0 g/dL Final   HCT 04/09/2022 46.1  39.0 - 52.0 % Final   MCV 04/09/2022 98.7  80.0 - 100.0 fL Final   MCH 04/09/2022 33.2  26.0 - 34.0 pg Final   MCHC 04/09/2022 33.6  30.0 - 36.0 g/dL Final   RDW 04/09/2022 12.5  11.5 - 15.5 % Final   Platelets 04/09/2022 175  150 - 400 K/uL Final   nRBC 04/09/2022 0.0  0.0 - 0.2 % Final   Neutrophils Relative % 04/09/2022 47  % Final   Neutro Abs 04/09/2022 2.0  1.7 - 7.7 K/uL Final   Lymphocytes Relative 04/09/2022 41  % Final   Lymphs Abs 04/09/2022 1.8  0.7 - 4.0 K/uL Final   Monocytes Relative 04/09/2022 9  % Final   Monocytes Absolute 04/09/2022 0.4  0.1 - 1.0 K/uL Final   Eosinophils Relative 04/09/2022 2  % Final   Eosinophils Absolute 04/09/2022 0.1  0.0 - 0.5 K/uL Final   Basophils Relative 04/09/2022 1  % Final   Basophils Absolute 04/09/2022 0.0  0.0 - 0.1 K/uL Final   Immature Granulocytes 04/09/2022 0  % Final   Abs Immature Granulocytes 04/09/2022 0.01  0.00 - 0.07 K/uL Final   Performed at [facility], [place] [address], [place], [place] [address]   Sodium 04/09/2022 143  135 - 145 mmol/L Final   Potassium 04/09/2022 3.4 (L)  3.5 - 5.1 mmol/L Final   Chloride 04/09/2022 106  98 - 111 mmol/L Final   CO2 04/09/2022 26  22 - 32 mmol/L Final   Glucose, Bld 04/09/2022 99  70 - 99 mg/dL Final   Glucose reference range applies only to samples taken after fasting for at least 8 hours.   BUN 04/09/2022 9  6 - 20 mg/dL Final   Creatinine, Ser 04/09/2022 0.70  0.61 - 1.24 mg/dL Final   Calcium 04/09/2022 9.1  8.9 - 10.3 mg/dL Final   Total Protein 04/09/2022 7.9  6.5 - 8.1 g/dL Final   Albumin 04/09/2022 4.6  3.5 - 5.0 g/dL Final   AST 04/09/2022 57 (H)  15 - 41 U/L Final   ALT 04/09/2022 56 (H)  0 - 44  U/L Final   Alkaline Phosphatase 04/09/2022 90  38 - 126 U/L Final   Total Bilirubin 04/09/2022 0.6  0.3 - 1.2 mg/dL Final   GFR, Estimated 04/09/2022 >60  >60 mL/min Final   Comment: (NOTE) Calculated using the CKD-EPI Creatinine Equation (2021)    Anion gap 04/09/2022 11  5 - 15 Final   Performed at [facility], [place] [address] [place], [place] [address]   Opiates 04/09/2022 NONE DETECTED  NONE DETECTED Final   Cocaine 04/09/2022 NONE DETECTED  NONE DETECTED Final   Benzodiazepines 04/09/2022 POSITIVE (A)  NONE DETECTED Final   Amphetamines 04/09/2022 NONE DETECTED  NONE DETECTED Final   Tetrahydrocannabinol 04/09/2022 NONE DETECTED  NONE DETECTED Final   Barbiturates 04/09/2022 NONE DETECTED  NONE DETECTED Final   Comment: (NOTE) DRUG SCREEN FOR MEDICAL PURPOSES ONLY.  IF CONFIRMATION IS NEEDED FOR ANY PURPOSE, NOTIFY LAB WITHIN 5 DAYS.  LOWEST DETECTABLE LIMITS FOR URINE DRUG SCREEN Drug Class                     Cutoff (ng/mL) Amphetamine and metabolites    1000 Barbiturate and metabolites    200 Benzodiazepine                 784 Tricyclics and metabolites     300 Opiates and metabolites        300 Cocaine and metabolites        300 THC                            50 Performed at [facility], [place] [address] [place], [place] [address]    Alcohol, Ethyl (  B) 04/09/2022 289 (H)  <10 mg/dL Final   Comment: (NOTE) Lowest detectable limit for serum alcohol is 10 mg/dL.  For medical purposes only. Performed at [facility], [place] [address], [place], [place] [address]    Lipase 04/09/2022 31  11 - 51 U/L Final   Performed at [facility], [place] [address] [place], [place] [address]  Admission on 02/19/2022, Discharged on 02/20/2022  Component Date Value Ref Range Status   Sodium 02/19/2022 141  135 - 145 mmol/L Final   Potassium 02/19/2022 3.2 (L)  3.5 - 5.1 mmol/L Final   Chloride 02/19/2022 102   98 - 111 mmol/L Final   CO2 02/19/2022 30  22 - 32 mmol/L Final   Glucose, Bld 02/19/2022 111 (H)  70 - 99 mg/dL Final   Glucose reference range applies only to samples taken after fasting for at least 8 hours.   BUN 02/19/2022 7  6 - 20 mg/dL Final   Creatinine, Ser 02/19/2022 0.69  0.61 - 1.24 mg/dL Final   Calcium 02/19/2022 9.0  8.9 - 10.3 mg/dL Final   Total Protein 02/19/2022 7.6  6.5 - 8.1 g/dL Final   Albumin 02/19/2022 4.6  3.5 - 5.0 g/dL Final   AST 02/19/2022 54 (H)  15 - 41 U/L Final   ALT 02/19/2022 43  0 - 44 U/L Final   Alkaline Phosphatase 02/19/2022 88  38 - 126 U/L Final   Total Bilirubin 02/19/2022 0.6  0.3 - 1.2 mg/dL Final   GFR, Estimated 02/19/2022 >60  >60 mL/min Final   Comment: (NOTE) Calculated using the CKD-EPI Creatinine Equation (2021)    Anion gap 02/19/2022 9  5 - 15 Final   Performed at [facility], [address] [place], [place] [address]   WBC 02/19/2022 6.8  4.0 - 10.5 K/uL Final   RBC 02/19/2022 4.81  4.22 - 5.81 MIL/uL Final   Hemoglobin 02/19/2022 16.0  13.0 - 17.0 g/dL Final   HCT 02/19/2022 47.0  39.0 - 52.0 % Final   MCV 02/19/2022 97.7  80.0 - 100.0 fL Final   MCH 02/19/2022 33.3  26.0 - 34.0 pg Final   MCHC 02/19/2022 34.0  30.0 - 36.0 g/dL Final   RDW 02/19/2022 13.5  11.5 - 15.5 % Final   Platelets 02/19/2022 242  150 - 400 K/uL Final   nRBC 02/19/2022 0.0  0.0 - 0.2 % Final   Neutrophils Relative % 02/19/2022 53  % Final   Neutro Abs 02/19/2022 3.6  1.7 - 7.7 K/uL Final   Lymphocytes Relative 02/19/2022 35  % Final   Lymphs Abs 02/19/2022 2.4  0.7 - 4.0 K/uL Final   Monocytes Relative 02/19/2022 9  % Final   Monocytes Absolute 02/19/2022 0.6  0.1 - 1.0 K/uL Final   Eosinophils Relative 02/19/2022 2  % Final   Eosinophils Absolute 02/19/2022 0.1  0.0 - 0.5 K/uL Final   Basophils Relative 02/19/2022 1  % Final   Basophils Absolute 02/19/2022 0.1  0.0 - 0.1 K/uL Final   Immature Granulocytes 02/19/2022 0  % Final   Abs  Immature Granulocytes 02/19/2022 0.02  0.00 - 0.07 K/uL Final   Performed at [facility], [address] [place], [place] [address]   Alcohol, Ethyl (B) 02/19/2022 337 (HH)  <10 mg/dL Final   Comment: CRITICAL RESULT CALLED TO, READ BACK BY AND VERIFIED WITH: VALORIE POWELL,RN ON 02/19/22 AT 2259 BY JAG (NOTE) Lowest detectable  limit for serum alcohol is 10 mg/dL.  For medical purposes only. Performed at [facility], [address] [place], [place] [address]    Magnesium 02/19/2022 1.9  1.7 - 2.4 mg/dL Final   Performed at [facility], [address] [place], [place] [address]   Color, Urine 02/20/2022 YELLOW  YELLOW Final   APPearance 02/20/2022 CLEAR  CLEAR Final   Specific Gravity, Urine 02/20/2022 1.010  1.005 - 1.030 Final   pH 02/20/2022 6.5  5.0 - 8.0 Final   Glucose, UA 02/20/2022 NEGATIVE  NEGATIVE mg/dL Final   Hgb urine dipstick 02/20/2022 NEGATIVE  NEGATIVE Final   Bilirubin Urine 02/20/2022 NEGATIVE  NEGATIVE Final   Ketones, ur 02/20/2022 NEGATIVE  NEGATIVE mg/dL Final   Protein, ur 02/20/2022 NEGATIVE  NEGATIVE mg/dL Final   Nitrite 02/20/2022 NEGATIVE  NEGATIVE Final   Leukocytes,Ua 02/20/2022 NEGATIVE  NEGATIVE Final   Comment: Microscopic not done on urines with negative protein, blood, leukocytes, nitrite, or glucose < 500 mg/dL. Performed at [facility], [address] [place], [place] [address]    Lipase 02/19/2022 48  11 - 51 U/L Final   Performed at [facility], [address] [place], [place] [address]    Blood Alcohol level:  Lab Results  Component Value Date   ETH <10 07/26/2022   ETH 178 (H) 95/18/8416    Metabolic Disorder Labs: Lab Results  Component Value Date   HGBA1C 5.1 07/26/2022   MPG 99.67 07/26/2022   MPG 105.41 06/30/2022   No results found for: "PROLACTIN" Lab Results  Component Value Date   CHOL 236 (H) 06/30/2022   TRIG 110 06/30/2022   HDL 53 06/30/2022    CHOLHDL 4.5 06/30/2022   VLDL 22 06/30/2022   LDLCALC 161 (H) 06/30/2022    Therapeutic Lab Levels: No results found for: "LITHIUM" No results found for: "VALPROATE" No results found for: "CBMZ"  Physical Findings   AUDIT    Flowsheet Row Admission (Discharged) from 06/28/2022 in [facility]  Alcohol Use Disorder Identification Test Final Score (AUDIT) 40      PHQ2-9    [facility] ED from 07/26/2022 in [facility] ED from 11/18/2021 in [facility]  PHQ-2 Total Score 5 3  PHQ-9 Total Score 14 10      Flowsheet Row ED from 07/26/2022 in [facility] ED from 07/16/2022 in [facility] DEPT Admission (Discharged) from 06/28/2022 in [facility]  C-SSRS RISK CATEGORY No Risk Error: Q3, 4, or 5 should not be populated when Q2 is No High Risk        Musculoskeletal  Strength & Muscle Tone: within normal limits Gait & Station: normal Patient leans: N/A  Psychiatric Specialty Exam  Presentation  General Appearance:  Appropriate for Environment  Eye Contact: Good  Speech: Clear and Coherent  Speech Volume: Normal  Handedness: Right   Mood and Affect  Mood: -- (Good)  Affect: Appropriate   Thought Process  Thought Processes: Coherent  Descriptions of Associations:Intact  Orientation:Full (Time, Place and Person)  Thought Content:Logical  Diagnosis of Schizophrenia or Schizoaffective disorder in past: No    Hallucinations:Hallucinations: None  Ideas of Reference:None  Suicidal Thoughts:Suicidal Thoughts: No  Homicidal Thoughts:Homicidal Thoughts: No   Sensorium Memory: Immediate Good; Recent Fair  Judgment: Fair  Insight: Fair  Community education officer  Concentration:  Good  Attention Span: Good  Recall: Good  Fund of  Knowledge: Good  Language: Good   Psychomotor Activity  Psychomotor Activity: Psychomotor Activity: Psychomotor Retardation   Assets  Assets: Desire for Improvement; Resilience   Sleep  Sleep: Sleep: Good  Physical Exam  Physical Exam Constitutional:      General: He is not in acute distress.    Appearance: He is not ill-appearing or diaphoretic.  HENT:     Head: Normocephalic and atraumatic.  Pulmonary:     Effort: Pulmonary effort is normal.  Neurological:     Mental Status: He is alert.    Review of Systems  Respiratory:  Negative for shortness of breath.   Cardiovascular:  Negative for chest pain.  Gastrointestinal:  Negative for abdominal pain, nausea and vomiting.  Musculoskeletal:  Negative for myalgias.  Neurological:  Negative for dizziness and headaches.   Blood pressure (!) 123/92, pulse 76, temperature 98 F (36.7 C), temperature source Oral, resp. rate 18, SpO2 99 %. There is no height or weight on file to calculate BMI.  Treatment Plan Summary: Daily contact with patient to assess and evaluate symptoms and progress in treatment and Medication management  [place] Phillippi is a 41 y.o. male, with PMH with AUD, MDD, tobacco use d/o, inpatient psych admission [facility] Oct 2023), remote suicide attempt, who presented voluntary to [facility] (07/26/2022) via [facility] for active SI, then admitted to [facility] (07/27/2022) for assistance with residential placement for AUD.     UDS+bzo BAL negative   AUD, severe  Daily drink about 1pint to 1liter daily, for past 3-4years, with last drink was 11/13. Amenable to residential rehab, has never been. Precontemplative stage. Improved insight, patient now recognizing that he drank out of anger and to "cope" with this.  EtOH craving improving, still ranked it at 5/10 (10/10 being the worst). Tolerating naltrexone well, no GI or sleepy side effects.  CIWA with ativan PRN per protocol with thiamine & MV supplement Continue Naltrexone '50mg'$   daily   MDD  Improving affect, seen laughing and interacting with other patients. More talkative with this author.  Continued home cymbalta 60 mg daily Continued home seroquel 100 mg qHS   Tobacco use d/o Contemplative stage. Encouraged cessation NRTs   HTN Continued home amlodipine 10 mg daily   Considerations for follow-up: Recommend high risk screening every 6-12 months with HIV, RPR, hepatitis panel Recommend monitoring LFT every 6-12 months while on naltrexone   DISPO: IS amenable to residential rehab. Will attempt to dc patient door-to-door, however if unable will discuss with CD-IOP as bridge to rehab.  Tentative date: 11/21 Location: [facility] residential re[facility]  PGY-2 Merrily Brittle, DO 07/31/2022 1:43 PM

## 2022-07-31 NOTE — Discharge Instructions (Addendum)
Discharge to: [place] for Medicaid and State Funded/IPRS  [facility] [place], [place], [address] [phone] phone NOTE: [facility] is no longer offering IOP services.  Serves those who are low-income or have no insurance.  [facility] [address] [place], [place], [address] [phone] phone [phone] fax NOTE: Does have Substance Abuse-Intensive Outpatient Program [facility]) as well as transitional housing if eligible.  [place], [place], [address] [phone] phone [phone] fax  [place] [phone] [address] [place], [place], [address] [phone] phone [phone] fax  HALFWAY HOUSES:  [facility] [place] [phone]  [employer].oxfordvacancies.com  12 STEP PROGRAMS:  Alcoholics Anonymous of [place] ReportZoo.com.cy  Narcotics Anonymous of  GreenScrapbooking.dk  Al-Anon of [employer], [place] www.[place]alanon.org/find-meetings.html  Nar-Anon https://nar-anon.org/find-a-meetin  [facility] [address] [place], [place] [address] [phone] Population served: Adult men & women (39 years old and older, able to perform activities for daily living) Documents required: Valid ID & Social Security Card  [facility] [address] [place], [place] [address] [phone] Population served: Males 18+ Documents required: Valid ID & Social Security [facility] [place], [place], [place] [address] [phone] Population Served: Families with children, adult women, and adult men.  [facility] [address] [place], [place] [address] [phone] Population served: Men 18+, preference for disabled and/or veterans Eligibility: By  referral only  Dear Liliane Channel,  Most effective treatment for your mental health disease involves BOTH a psychiatrist AND a therapist Psychiatrist to manage medications Therapist to help identify personal goals, barriers from those goals, and plan to achieve those goals by understanding emotions Please make regular appointments with an outpatient psychiatrist and other doctors once you leave the hospital (if any, otherwise, please see below for resources to make an appointment).  For therapy outside the hospital, please ask for these specific types of therapy: DBT ________________________________________________________  SAFETY CRISIS  Dial 988 for [place]    Text [phone] for Crisis Text Line:     [facility]:  [address] FIRST FLOOR.  [place], [place] [address].  [phone]  Mobile Crisis Response Teams Listed by counties in vicinity of [place]. [phone] [place] [phone] [place] [phone] [facility] [phone] [place] [phone] [place]. [phone] [place].  [place] [phone] ________________________________________________________  To see which pharmacy near you is the CHEAPEST for certain medications, please use GoodRx. It is free website and has a free phone app.    Also consider looking at [facility] $4.00 or [facility]'s $7.00 prescription list. Both are free to view if googled "[facility] $4 prescription" and "public's $7 prescription". These are set prices, no insurance required. [facility]'s low cost medications: $4-$15 for  30days prescriptions or $10-$38 for 90days prescriptions  ________________________________________________________  Difficulties with sleep?   Can also use this free app for insomnia called CBT-I. Let your doctors and therapists know  so they can help with extra tips and tricks or for guidance and accountability. NO ADDS on the app.     ________________________________________________________  Non-Emergent / Urgent  [facility] [address], [place], [place] [address] [phone] OUTPATIENT Walk-in information: Please note, all walk-ins are first come & first serve, with limited number of availability.  Please note that to be eligible for services you must bring: ID or a piece of mail with your name [facility] address  Therapist for therapy:  Monday & Wednesdays: Please ARRIVE at 7:15 AM for registration Will START at 8:00 AM Every 1st & 2nd Friday of the month: Please ARRIVE at 10:15 AM for registration Will START at 1 PM - 5 PM  Psychiatrist for medication management: Monday - Friday:  Please ARRIVE at 7:15 AM for registration Will START at 8:00 AM  Regretfully, due to limited availability, please be aware that you may not been seen on the same day as walk-in. Please consider making an appoint or try again. Thank you for your patience and understanding.

## 2022-08-01 NOTE — ED Notes (Signed)
Pt sleeping@this time. Breathign even and unlabored. Will continue to monitor for safety 

## 2022-08-07 ENCOUNTER — Encounter: Payer: Self-pay | Admitting: Physician Assistant

## 2022-08-07 ENCOUNTER — Ambulatory Visit: Payer: PRIVATE HEALTH INSURANCE | Admitting: Physician Assistant

## 2022-08-07 VITALS — BP 128/94 | HR 90 | Ht 68.0 in | Wt 138.0 lb

## 2022-08-07 DIAGNOSIS — F1010 Alcohol abuse, uncomplicated: Secondary | ICD-10-CM

## 2022-08-07 DIAGNOSIS — F17200 Nicotine dependence, unspecified, uncomplicated: Secondary | ICD-10-CM

## 2022-08-07 DIAGNOSIS — F411 Generalized anxiety disorder: Secondary | ICD-10-CM | POA: Diagnosis not present

## 2022-08-07 DIAGNOSIS — G259 Extrapyramidal and movement disorder, unspecified: Secondary | ICD-10-CM

## 2022-08-07 DIAGNOSIS — I10 Essential (primary) hypertension: Secondary | ICD-10-CM | POA: Diagnosis not present

## 2022-08-07 DIAGNOSIS — F332 Major depressive disorder, recurrent severe without psychotic features: Secondary | ICD-10-CM

## 2022-08-07 DIAGNOSIS — E782 Mixed hyperlipidemia: Secondary | ICD-10-CM

## 2022-08-07 MED ORDER — QUETIAPINE FUMARATE 100 MG PO TABS: 100.0000 mg | ORAL_TABLET | Freq: Every day | ORAL | 1 refills | Status: AC

## 2022-08-07 MED ORDER — BENZTROPINE MESYLATE 1 MG PO TABS: 1.0000 mg | ORAL_TABLET | Freq: Two times a day (BID) | ORAL | 0 refills | Status: DC

## 2022-08-07 MED ORDER — DULOXETINE HCL 60 MG PO CPEP: 60.0000 mg | ORAL_CAPSULE | Freq: Every day | ORAL | 1 refills | Status: DC

## 2022-08-07 MED ORDER — AMLODIPINE BESYLATE 10 MG PO TABS: 10.0000 mg | ORAL_TABLET | Freq: Every day | ORAL | 1 refills | Status: DC

## 2022-08-07 MED ORDER — NALTREXONE HCL 50 MG PO TABS: 50.0000 mg | ORAL_TABLET | Freq: Every day | ORAL | 1 refills | Status: AC

## 2022-08-07 MED ORDER — NICOTINE 21 MG/24HR TD PT24: 21.0000 mg | MEDICATED_PATCH | Freq: Every day | TRANSDERMAL | 1 refills | Status: AC

## 2022-08-07 MED ORDER — HYDROXYZINE HCL 10 MG PO TABS: 10.0000 mg | ORAL_TABLET | Freq: Three times a day (TID) | ORAL | 1 refills | Status: DC | PRN

## 2022-08-07 NOTE — Patient Instructions (Addendum)
To help with the potential side effect from Seroquel, you are going to take Cogentin twice daily for 2 weeks.  I do encourage you to check your blood pressure on a daily basis, keep a written log and have available for all office visits.  To help with your anxiety you are going to use hydroxyzine 10 mg every 8 hours as needed.  Kennieth Rad, PA-C Physician Assistant [facility] Medicine [email or web address]  Benztropine Tablets What is this medication? BENZTROPINE (BENZ troe peen) treats movement disorders, including those caused by Parkinson disease and some medications. It works by balancing substances in your brain that help manage body movements and coordination. This reduces symptoms, such as body stiffness and tremors. This medicine may be used for other purposes; ask your health care provider or pharmacist if you have questions. COMMON BRAND NAME(S): Cogentin What should I tell my care team before I take this medication? They need to know if you have any of these conditions: Glaucoma Heart disease or a rapid heartbeat Mental health condition Prostate trouble Tardive dyskinesia An unusual or allergic reaction to benztropine, lactose, other medications, foods, dyes, or preservatives Pregnant or trying to get pregnant Breast-feeding How should I use this medication? Take this medication by mouth with a full glass of water. Take it as directed on the prescription label. Keep taking it unless your care team tells you to stop. Talk to your care team about the use of this medication in children. While it may be prescribed for children as young as 3 years for selected conditions, precautions do apply. Overdosage: If you think you have taken too much of this medicine contact a poison control center or emergency room at once. NOTE: This medicine is only for you. Do not share this medicine with others. What if I miss a dose? If you miss a  dose, take it as soon as you can. If it is almost time for your next dose, take only that dose. Do not take double or extra doses. What may interact with this medication? Haloperidol Medications for movement abnormalities, such as Parkinson disease Phenothiazines, such as chlorpromazine, mesoridazine, prochlorperazine, thioridazine Some antidepressants, such as amitriptyline, desipramine, doxepin, nortriptyline Stimulant medications for ADHD, weight loss, or staying awake Tegaserod This list may not describe all possible interactions. Give your health care provider a list of all the medicines, herbs, non-prescription drugs, or dietary supplements you use. Also tell them if you smoke, drink alcohol, or use illegal drugs. Some items may interact with your medicine. What should I watch for while using this medication? Visit your care team for regular checks on your progress. Tell your care team if your symptoms do not start to get better or if they get worse. This medication may affect your coordination, reaction time, or judgement. Do not drive or operate machinery until you know how this medication affects you. Sit up or stand slowly to reduce the risk of dizzy or fainting spells. Drinking alcohol with this medication can increase the risk of these side effects. Your mouth may get dry. Chewing sugarless gum or sucking hard candy and drinking plenty of water may help. Contact your care team if the problem does not go away or is severe. This medication may cause dry eyes and blurred vision. If you wear contact lenses, you may feel some discomfort. Lubricating eye drops may help. See your care team if the problem does not go away or is severe. Avoid extreme heat. This medication can cause you to  sweat less than normal. Your body temperature could increase to dangerous levels, which may lead to heat stroke. What side effects may I notice from receiving this medication? Side effects that you should report  to your care team as soon as possible: Allergic reactions--skin rash, itching, hives, swelling of the face, lips, tongue, or throat Anticholinergic toxicity--flushed face, blurry vision, dry mouth and skin, confusion, fast or irregular heartbeat, trouble passing urine, constipation Fever that does not go away, decreased sweating Hallucinations Sudden eye pain or change in vision such as blurry vision, seeing halos around lights, vision loss Side effects that usually do not require medical attention (report to your care team if they continue or are bothersome): Nausea Vomiting This list may not describe all possible side effects. Call your doctor for medical advice about side effects. You may report side effects to FDA at 1-800-FDA-1088. Where should I keep my medication? Keep out of the reach of children and pets. Store at room temperature between 20 and 25 degrees C (68 and 77 degrees F). Get rid of any unused medication after the expiration date. To get rid of medications that are no longer needed or have expired: Take the medication to a take-back program. Check with your pharmacy or law enforcement to find a location. If you cannot return the medication, check the label or package insert to see if the medication should be thrown out in the garbage or flushed down the toilet. If you are not sure, ask your care team. If it is safe to put it in the trash, empty the medication out of the container. Mix the medication with cat litter, dirt, coffee grounds, or other unwanted substance. Seal the mixture in a bag or container. Put it in the trash. NOTE: This sheet is a summary. It may not cover all possible information. If you have questions about this medicine, talk to your doctor, pharmacist, or health care provider.  2023 Elsevier/Gold Standard (2007-10-19 00:00:00)

## 2022-08-07 NOTE — Progress Notes (Unsigned)
New Patient Office Visit  Subjective    Patient ID: Charles Graves, male    DOB: 15-Jun-1981  Age: 41 y.o. MRN: [id number]  CC:  Chief Complaint  Patient presents with   Medication Management    Pt c/o anxiety, Convulsions when patient lays down to sleep. Lasting between 7-10 minutes, Patient also has trouble sleeping     HPI Charles Graves states that he is currently being treated for substance abuse at [facility] residential treatment center, states that he November 21, does not have an after care plan.  States that he has been having "jerky movements" when he lays down to rest for the past month.  States that it does tend to stop when he falls asleep, but unfortunately it does continue throughout the night and wakes him up several times.  Also endorses that he did start 100 mg of Seroquel 1 month ago as well.  Does feel that the Seroquel is offering relief in his moods been stable.  States that he has been having elevated anxiety, states that he does have a history of anxiety, states that he has not tried any medication for relief.  States that he does not check his blood pressure on a daily basis.   Outpatient Encounter Medications as of 08/07/2022  Medication Sig   benztropine (COGENTIN) 1 MG tablet Take 1 tablet (1 mg total) by mouth 2 (two) times daily.   hydrOXYzine (ATARAX) 10 MG tablet Take 1 tablet (10 mg total) by mouth 3 (three) times daily as needed.   amLODipine (NORVASC) 10 MG tablet Take 1 tablet (10 mg total) by mouth at bedtime.   DULoxetine (CYMBALTA) 60 MG capsule Take 1 capsule (60 mg total) by mouth at bedtime.   Multiple Vitamin (MULTIVITAMIN WITH MINERALS) TABS tablet Take 1 tablet by mouth at bedtime.   naltrexone (DEPADE) 50 MG tablet Take 1 tablet (50 mg total) by mouth daily.   nicotine (NICODERM CQ - DOSED IN MG/24 HOURS) 21 mg/24hr patch Place 1 patch (21 mg total) onto the skin daily.   QUEtiapine (SEROQUEL) 100 MG tablet Take 1 tablet (100 mg total) by mouth  at bedtime.   thiamine (VITAMIN B-1) 100 MG tablet Take 1 tablet (100 mg total) by mouth at bedtime.   [DISCONTINUED] amLODipine (NORVASC) 10 MG tablet Take 1 tablet (10 mg total) by mouth at bedtime.   [DISCONTINUED] DULoxetine (CYMBALTA) 60 MG capsule Take 1 capsule (60 mg total) by mouth at bedtime.   [DISCONTINUED] naltrexone (DEPADE) 50 MG tablet Take 1 tablet (50 mg total) by mouth daily.   [DISCONTINUED] nicotine (NICODERM CQ - DOSED IN MG/24 HOURS) 21 mg/24hr patch Place 1 patch (21 mg total) onto the skin daily.   [DISCONTINUED] QUEtiapine (SEROQUEL) 100 MG tablet Take 1 tablet (100 mg total) by mouth at bedtime.   No facility-administered encounter medications on file as of 08/07/2022.    Past Medical History:  Diagnosis Date   Alcohol abuse    Alcohol abuse 11/03/2021   Brain hemangioma (HCC)    Chronic left shoulder pain    Malingering    Subarachnoid bleed (HCC)    Suicidal thoughts     History reviewed. No pertinent surgical history.  History reviewed. No pertinent family history.  Social History   Socioeconomic History   Marital status: Single    Spouse name: Not on file   Number of children: Not on file   Years of education: Not on file   Highest education level: Not on  file  Occupational History   Not on file  Tobacco Use   Smoking status: Every Day    Packs/day: 0.50    Types: Cigarettes   Smokeless tobacco: Never  Vaping Use   Vaping Use: Never used  Substance and Sexual Activity   Alcohol use: Yes    Comment: state 1/2 pint/day   Drug use: Not Currently   Sexual activity: Not Currently  Other Topics Concern   Not on file  Social History Narrative   Not on file   Social Determinants of Health   Financial Resource Strain: Not on file  Food Insecurity: Food Insecurity Present (06/28/2022)   Hunger Vital Sign    Worried About Running Out of Food in the Last Year: Sometimes true    Ran Out of Food in the Last Year: Sometimes true   Transportation Needs: Unmet Transportation Needs (06/28/2022)   PRAPARE - Hydrologist (Medical): Yes    Lack of Transportation (Non-Medical): Yes  Physical Activity: Not on file  Stress: Not on file  Social Connections: Not on file  Intimate Partner Violence: Not At Risk (06/28/2022)   Humiliation, Afraid, Rape, and Kick questionnaire    Fear of Current or Ex-Partner: No    Emotionally Abused: No    Physically Abused: No    Sexually Abused: No    Review of Systems  Constitutional: Negative.   HENT: Negative.    Eyes: Negative.   Respiratory:  Negative for shortness of breath.   Cardiovascular:  Negative for chest pain.  Gastrointestinal: Negative.   Genitourinary: Negative.   Musculoskeletal: Negative.   Skin: Negative.   Neurological:  Positive for tremors. Negative for tingling, seizures, loss of consciousness and weakness.  Endo/Heme/Allergies: Negative.   Psychiatric/Behavioral:  The patient is nervous/anxious and has insomnia.         Objective    BP (!) 128/94 (BP Location: Left Arm, Patient Position: Sitting, Cuff Size: Normal)   Pulse 90   Ht '5\' 8"'$  (1.727 m)   Wt 138 lb (62.6 kg)   SpO2 98%   BMI 20.98 kg/m   Physical Exam Vitals and nursing note reviewed.  Constitutional:      Appearance: Normal appearance.  HENT:     Head: Normocephalic and atraumatic.     Right Ear: External ear normal.     Left Ear: External ear normal.     Nose: Nose normal.     Mouth/Throat:     Mouth: Mucous membranes are moist.     Pharynx: Oropharynx is clear.  Eyes:     Extraocular Movements: Extraocular movements intact.     Conjunctiva/sclera: Conjunctivae normal.     Pupils: Pupils are equal, round, and reactive to light.  Cardiovascular:     Rate and Rhythm: Normal rate and regular rhythm.     Pulses: Normal pulses.     Heart sounds: Normal heart sounds.  Pulmonary:     Effort: Pulmonary effort is normal.     Breath sounds: Normal  breath sounds.  Musculoskeletal:        General: Normal range of motion.     Cervical back: Normal range of motion and neck supple.  Skin:    General: Skin is warm and dry.  Neurological:     General: No focal deficit present.     Mental Status: He is alert and oriented to person, place, and time.  Psychiatric:        Mood and Affect: Mood  normal.        Behavior: Behavior normal.        Thought Content: Thought content normal.        Judgment: Judgment normal.       Assessment & Plan:   Problem List Items Addressed This Visit       Cardiovascular and Mediastinum   Essential hypertension   Relevant Medications   amLODipine (NORVASC) 10 MG tablet     Other   Major depressive disorder, recurrent episode, severe (HCC) - Primary (Chronic)   Relevant Medications   hydrOXYzine (ATARAX) 10 MG tablet   DULoxetine (CYMBALTA) 60 MG capsule   QUEtiapine (SEROQUEL) 100 MG tablet   Alcohol abuse   Relevant Medications   naltrexone (DEPADE) 50 MG tablet   Tobacco use disorder   Relevant Medications   nicotine (NICODERM CQ - DOSED IN MG/24 HOURS) 21 mg/24hr patch   GAD (generalized anxiety disorder)   Relevant Medications   hydrOXYzine (ATARAX) 10 MG tablet   DULoxetine (CYMBALTA) 60 MG capsule   Moderate mixed hyperlipidemia not requiring statin therapy   Relevant Medications   amLODipine (NORVASC) 10 MG tablet   Other Visit Diagnoses     Extrapyramidal syndrome       Relevant Medications   benztropine (COGENTIN) 1 MG tablet     1. Severe episode of recurrent major depressive disorder, without psychotic features ([place]) Continue Cymbalta, patient does want to continue Seroquel at this time - DULoxetine (CYMBALTA) 60 MG capsule; Take 1 capsule (60 mg total) by mouth at bedtime.  Dispense: 30 capsule; Refill: 1 - QUEtiapine (SEROQUEL) 100 MG tablet; Take 1 tablet (100 mg total) by mouth at bedtime.  Dispense: 30 tablet; Refill: 1  2. GAD (generalized anxiety disorder) Trial  hydroxyzine - hydrOXYzine (ATARAX) 10 MG tablet; Take 1 tablet (10 mg total) by mouth 3 (three) times daily as needed.  Dispense: 90 tablet; Refill: 1  3. Extrapyramidal syndrome Trial Cogentin.  Patient to follow-up with mobile unit in 2 weeks for further evaluation - benztropine (COGENTIN) 1 MG tablet; Take 1 tablet (1 mg total) by mouth 2 (two) times daily.  Dispense: 28 tablet; Refill: 0  4. Essential hypertension Continue current regimen, patient encouraged to check blood pressure at home, keep a written log and have available for all office visits - amLODipine (NORVASC) 10 MG tablet; Take 1 tablet (10 mg total) by mouth at bedtime.  Dispense: 30 tablet; Refill: 1  5. Moderate mixed hyperlipidemia not requiring statin therapy The 10-year ASCVD risk score (Arnett DK, et al., 2019) is: 6.8%   Values used to calculate the score:     Age: 18 years     Sex: Male     Is Non-Hispanic African American: No     Diabetic: No     Tobacco smoker: Yes     Systolic Blood Pressure: 378 mmHg     Is BP treated: Yes     HDL Cholesterol: 53 mg/dL     Total Cholesterol: 236 mg/dL   6. Alcohol abuse Continue current regimen - naltrexone (DEPADE) 50 MG tablet; Take 1 tablet (50 mg total) by mouth daily.  Dispense: 30 tablet; Refill: 1  7. Tobacco use disorder Continue current regimen - nicotine (NICODERM CQ - DOSED IN MG/24 HOURS) 21 mg/24hr patch; Place 1 patch (21 mg total) onto the skin daily.  Dispense: 28 patch; Refill: 1   I have reviewed the patient's medical history (PMH, PSH, Social History, Family History, Medications, and allergies) ,  and have been updated if relevant. I spent 30 minutes reviewing chart and  face to face time with patient.   Return in about 2 weeks (around 08/21/2022) for with [facility]U.   Loraine Grip Mayers, PA-C

## 2022-08-08 ENCOUNTER — Encounter: Payer: Self-pay | Admitting: Physician Assistant

## 2022-08-08 DIAGNOSIS — E782 Mixed hyperlipidemia: Secondary | ICD-10-CM | POA: Insufficient documentation

## 2022-08-08 DIAGNOSIS — I10 Essential (primary) hypertension: Secondary | ICD-10-CM | POA: Insufficient documentation

## 2022-08-08 DIAGNOSIS — F411 Generalized anxiety disorder: Secondary | ICD-10-CM | POA: Insufficient documentation

## 2022-08-08 DIAGNOSIS — E785 Hyperlipidemia, unspecified: Secondary | ICD-10-CM | POA: Insufficient documentation

## 2022-08-21 ENCOUNTER — Encounter: Payer: Self-pay | Admitting: Physician Assistant

## 2022-08-21 ENCOUNTER — Ambulatory Visit: Payer: PRIVATE HEALTH INSURANCE | Admitting: Physician Assistant

## 2022-08-21 VITALS — BP 129/97 | Ht 68.0 in | Wt 138.0 lb

## 2022-08-21 DIAGNOSIS — F1010 Alcohol abuse, uncomplicated: Secondary | ICD-10-CM

## 2022-08-21 DIAGNOSIS — Z23 Encounter for immunization: Secondary | ICD-10-CM

## 2022-08-21 DIAGNOSIS — I10 Essential (primary) hypertension: Secondary | ICD-10-CM | POA: Diagnosis not present

## 2022-08-21 DIAGNOSIS — F17200 Nicotine dependence, unspecified, uncomplicated: Secondary | ICD-10-CM

## 2022-08-21 DIAGNOSIS — F332 Major depressive disorder, recurrent severe without psychotic features: Secondary | ICD-10-CM

## 2022-08-21 DIAGNOSIS — G259 Extrapyramidal and movement disorder, unspecified: Secondary | ICD-10-CM

## 2022-08-21 DIAGNOSIS — F411 Generalized anxiety disorder: Secondary | ICD-10-CM

## 2022-08-21 MED ORDER — HYDROXYZINE HCL 25 MG PO TABS: ORAL_TABLET | ORAL | 1 refills | Status: AC

## 2022-08-21 MED ORDER — DULOXETINE HCL 60 MG PO CPEP: 60.0000 mg | ORAL_CAPSULE | Freq: Two times a day (BID) | ORAL | 1 refills | Status: AC

## 2022-08-21 MED ORDER — BENZTROPINE MESYLATE 2 MG PO TABS: 2.0000 mg | ORAL_TABLET | Freq: Two times a day (BID) | ORAL | 0 refills | Status: AC

## 2022-08-21 NOTE — Patient Instructions (Signed)
To help with your side effects from Seroquel, you will increase Cogentin 2 mg twice a day.  To help with your continued mood swings, you will increase Cymbalta 60 mg twice a day.  To help with your anxiety, you will increase hydroxyzine 25 mg, you will take 1/2-1 full tablet every 8 hours as needed.  You can change blood pressure checks to twice weekly.  You will follow-up with the mobile unit in 2 weeks.  Kennieth Rad, PA-C Physician Assistant [facility] [email or web address]   Managing Anxiety, Adult After being diagnosed with anxiety, you may be relieved to know why you have felt or behaved a certain way. You may also feel overwhelmed about the treatment ahead and what it will mean for your life. With care and support, you can manage this condition. How to manage lifestyle changes Managing stress and anxiety  Stress is your body's reaction to life changes and events, both good and bad. Most stress will last just a few hours, but stress can be ongoing and can lead to more than just stress. Although stress can play a major role in anxiety, it is not the same as anxiety. Stress is usually caused by something external, such as a deadline, test, or competition. Stress normally passes after the triggering event has ended.  Anxiety is caused by something internal, such as imagining a terrible outcome or worrying that something will go wrong that will devastate you. Anxiety often does not go away even after the triggering event is over, and it can become long-term (chronic) worry. It is important to understand the differences between stress and anxiety and to manage your stress effectively so that it does not lead to an anxious response. Talk with your health care provider or a counselor to learn more about reducing anxiety and stress. He or she may suggest tension reduction techniques, such as: Music therapy. Spend time creating or listening  to music that you enjoy and that inspires you. Mindfulness-based meditation. Practice being aware of your normal breaths while not trying to control your breathing. It can be done while sitting or walking. Centering prayer. This involves focusing on a word, phrase, or sacred image that means something to you and brings you peace. Deep breathing. To do this, expand your stomach and inhale slowly through your nose. Hold your breath for 3-5 seconds. Then exhale slowly, letting your stomach muscles relax. Self-talk. Learn to notice and identify thought patterns that lead to anxiety reactions and change those patterns to thoughts that feel peaceful. Muscle relaxation. Taking time to tense muscles and then relax them. Choose a tension reduction technique that fits your lifestyle and personality. These techniques take time and practice. Set aside 5-15 minutes a day to do them. Therapists can offer counseling and training in these techniques. The training to help with anxiety may be covered by some insurance plans. Other things you can do to manage stress and anxiety include: Keeping a stress diary. This can help you learn what triggers your reaction and then learn ways to manage your response. Thinking about how you react to certain situations. You may not be able to control everything, but you can control your response. Making time for activities that help you relax and not feeling guilty about spending your time in this way. Doing visual imagery. This involves imagining or creating mental pictures to help you relax. Practicing yoga. Through yoga poses, you can lower tension and promote relaxation.  Medicines Medicines can help ease symptoms.  Medicines for anxiety include: Antidepressant medicines. These are usually prescribed for long-term daily control. Anti-anxiety medicines. These may be added in severe cases, especially when panic attacks occur. Medicines will be prescribed by a health care  provider. When used together, medicines, psychotherapy, and tension reduction techniques may be the most effective treatment. Relationships Relationships can play a big part in helping you recover. Try to spend more time connecting with trusted friends and family members. Consider going to couples counseling if you have a partner, taking family education classes, or going to family therapy. Therapy can help you and others better understand your condition. How to recognize changes in your anxiety Everyone responds differently to treatment for anxiety. Recovery from anxiety happens when symptoms decrease and stop interfering with your daily activities at home or work. This may mean that you will start to: Have better concentration and focus. Worry will interfere less in your daily thinking. Sleep better. Be less irritable. Have more energy. Have improved memory. It is also important to recognize when your condition is getting worse. Contact your health care provider if your symptoms interfere with home or work and you feel like your condition is not improving. Follow these instructions at home: Activity Exercise. Adults should do the following: Exercise for at least 150 minutes each week. The exercise should increase your heart rate and make you sweat (moderate-intensity exercise). Strengthening exercises at least twice a week. Get the right amount and quality of sleep. Most adults need 7-9 hours of sleep each night. Lifestyle  Eat a healthy diet that includes plenty of vegetables, fruits, whole grains, low-fat dairy products, and lean protein. Do not eat a lot of foods that are high in fats, added sugars, or salt (sodium). Make choices that simplify your life. Do not use any products that contain nicotine or tobacco. These products include cigarettes, chewing tobacco, and vaping devices, such as e-cigarettes. If you need help quitting, ask your health care provider. Avoid caffeine, alcohol,  and certain over-the-counter cold medicines. These may make you feel worse. Ask your pharmacist which medicines to avoid. General instructions Take over-the-counter and prescription medicines only as told by your health care provider. Keep all follow-up visits. This is important. Where to find support You can get help and support from these sources: Self-help groups. Online and [employer]. A trusted spiritual leader. Couples counseling. Family education classes. Family therapy. Where to find more information You may find that joining a support group helps you deal with your anxiety. The following sources can help you locate counselors or support groups near you: [place]: www.mentalhealthamerica.net Anxiety and Depression Association of [place] (ADAA): https://www.clark.net/ National Alliance on Mental Illness (NAMI): www.nami.org Contact a health care provider if: You have a hard time staying focused or finishing daily tasks. You spend many hours a day feeling worried about everyday life. You become exhausted by worry. You start to have headaches or frequently feel tense. You develop chronic nausea or diarrhea. Get help right away if: You have a racing heart and shortness of breath. You have thoughts of hurting yourself or others. If you ever feel like you may hurt yourself or others, or have thoughts about taking your own life, get help right away. Go to your nearest emergency department or: Call your local emergency services (911 in the [place] Call a suicide crisis helpline, such as the [place] at 304-736-9719 or 988 in the [place]. This is open 24 hours a day in the [place] Text the  Crisis Text Line at 617-298-3566 (in the [place].). Summary Taking steps to learn and use tension reduction techniques can help calm you and help prevent triggering an anxiety reaction. When used together, medicines, psychotherapy, and tension reduction techniques may be  the most effective treatment. Family, friends, and partners can play a big part in supporting you. This information is not intended to replace advice given to you by your health care provider. Make sure you discuss any questions you have with your health care provider. Document Revised: 03/23/2021 Document Reviewed: 12/19/2020 Elsevier Patient Education  [place].

## 2022-08-21 NOTE — Progress Notes (Unsigned)
   Established Patient Office Visit  Subjective   Patient ID: Charles Graves, male    DOB: 01-Nov-1980  Age: 41 y.o. MRN: [id number]  No chief complaint on file.    [facility]  - sober living in [place]  -      1. Severe episode of recurrent major depressive disorder, without psychotic features ([place]) Continue Cymbalta, patient does want to continue Seroquel at this time  Once falls asleep - not waking up too much   Still having moods swings - 6 weeks on both - DULoxetine (CYMBALTA) 60 MG capsule; Take 1 capsule (60 mg total) by mouth at bedtime.  Dispense: 30 capsule; Refill: 1 - QUEtiapine (SEROQUEL) 100 MG tablet; Take 1 tablet (100 mg total) by mouth at bedtime.  Dispense: 30 tablet; Refill: 1   2. GAD (generalized anxiety disorder) Trial hydroxyzine  Anxiety is still high - using the hdroxyzine 3 times a day without relief -not sleepy  - hydrOXYzine (ATARAX) 10 MG tablet; Take 1 tablet (10 mg total) by mouth 3 (three) times daily as needed.  Dispense: 90 tablet; Refill: 1   3. Extrapyramidal syndrome Trial Cogentin.  Patient to follow-up with mobile unit in 2 weeks for further evaluation  States that the movements did become worse and now happening during the day Having dry mouth - has been drinking a lot of water with relief  - benztropine (COGENTIN) 1 MG tablet; Take 1 tablet (1 mg total) by mouth 2 (two) times daily.  Dispense: 28 tablet; Refill: 0   4. Essential hypertension Continue current regimen, patient encouraged to check blood pressure at home, keep a written log and have available for all office visits  On target  - amLODipine (NORVASC) 10 MG tablet; Take 1 tablet (10 mg total) by mouth at bedtime.  Dispense: 30 tablet; Refill: 1   5. Moderate mixed hyperlipidemia not requiring statin therapy The 10-year ASCVD risk score (Arnett DK, et al., 2019) is: 6.8%   Values used to calculate the score:     Age: 22 years     Sex: Male     Is Non-Hispanic  African American: No     Diabetic: No     Tobacco smoker: Yes     Systolic Blood Pressure: 191 mmHg     Is BP treated: Yes     HDL Cholesterol: 53 mg/dL     Total Cholesterol: 236 mg/dL     6. Alcohol abuse Continue current regimen - naltrexone (DEPADE) 50 MG tablet; Take 1 tablet (50 mg total) by mouth daily.  Dispense: 30 tablet; Refill: 1   7. Tobacco use disorder Continue current regimen - nicotine (NICODERM CQ - DOSED IN MG/24 HOURS) 21 mg/24hr patch; Place 1 patch (21 mg total) onto the skin daily.  Dispense: 28 patch; Refill: 1         {History (Optional):23778}  ROS    Objective:     There were no vitals taken for this visit. {Vitals History (Optional):23777}  Physical Exam   No results found for any visits on 08/21/22.  {Labs (Optional):23779}  The 10-year ASCVD risk score (Arnett DK, et al., 2019) is: 6.8%    Assessment & Plan:   Problem List Items Addressed This Visit   None   No follow-ups on file.    Loraine Grip Mayers, PA-C

## 2022-08-22 ENCOUNTER — Encounter: Payer: Self-pay | Admitting: Physician Assistant

## 2023-07-02 ENCOUNTER — Emergency Department (HOSPITAL_COMMUNITY): Payer: BLUE CROSS/BLUE SHIELD

## 2023-07-02 ENCOUNTER — Encounter (HOSPITAL_COMMUNITY): Payer: Self-pay

## 2023-07-02 ENCOUNTER — Emergency Department (HOSPITAL_COMMUNITY)
Admission: EM | Admit: 2023-07-02 | Discharge: 2023-07-03 | Disposition: A | Discharge status: Home / Self Care | Payer: BLUE CROSS/BLUE SHIELD | Attending: Emergency Medicine | Admitting: Emergency Medicine

## 2023-07-02 ENCOUNTER — Other Ambulatory Visit: Payer: Self-pay

## 2023-07-02 DIAGNOSIS — J069 Acute upper respiratory infection, unspecified: Secondary | ICD-10-CM | POA: Insufficient documentation

## 2023-07-02 DIAGNOSIS — R519 Headache, unspecified: Secondary | ICD-10-CM | POA: Diagnosis present

## 2023-07-02 DIAGNOSIS — R0602 Shortness of breath: Secondary | ICD-10-CM | POA: Diagnosis not present

## 2023-07-02 DIAGNOSIS — G43109 Migraine with aura, not intractable, without status migrainosus: Secondary | ICD-10-CM

## 2023-07-02 DIAGNOSIS — R110 Nausea: Secondary | ICD-10-CM | POA: Diagnosis not present

## 2023-07-02 DIAGNOSIS — R1084 Generalized abdominal pain: Secondary | ICD-10-CM | POA: Diagnosis not present

## 2023-07-02 DIAGNOSIS — Z20822 Contact with and (suspected) exposure to covid-19: Secondary | ICD-10-CM | POA: Diagnosis not present

## 2023-07-02 DIAGNOSIS — G459 Transient cerebral ischemic attack, unspecified: Secondary | ICD-10-CM | POA: Diagnosis not present

## 2023-07-02 LAB — RESP PANEL BY RT-PCR (RSV, FLU A&B, COVID)  RVPGX2
Influenza A by PCR: NEGATIVE
Influenza B by PCR: NEGATIVE
Resp Syncytial Virus by PCR: NEGATIVE
SARS Coronavirus 2 by RT PCR: NEGATIVE

## 2023-07-02 NOTE — ED Triage Notes (Signed)
Pt arrived reporting a 1 week of symptoms. Nausea, chills, abdominal pain, shob, right arm tingling. Hx of HBP, takes as prescribed. Pt in NAD. Able to speak in full sentences. Denies any other complaints.

## 2023-07-02 NOTE — ED Provider Triage Note (Cosign Needed Addendum)
Emergency Medicine Provider Triage Evaluation Note  Charles Graves , a 42 y.o. male  was evaluated in triage.  Pt complains of URI symptoms.  Review of Systems  Positive:  Negative:   Physical Exam  BP (!) 138/102 (BP Location: Left Arm)   Pulse 85   Temp 98.3 F (36.8 C) (Oral)   Resp 16   Ht 5\' 8"  (1.727 m)   Wt 62.6 kg   SpO2 100%   BMI 20.98 kg/m  Gen:   Awake, no distress   Resp:  Normal effort  MSK:   Moves extremities without difficulty  Other:    Medical Decision Making  Medically screening exam initiated at 5:00 PM.  Appropriate orders placed.  Mohib Bertram was informed that the remainder of the evaluation will be completed by another provider, this initial triage assessment does not replace that evaluation, and the importance of remaining in the ED until their evaluation is complete.  Concerned for nausea, chills, SOB, productive cough, intermittent headaches x1 week. States that his "abdominal pain" is more front left rib pain that is reproducible to palpation. Denies fever. Denies vomiting, diarrhea. Also complaining of frequent urination but denies dysuria.    Dorthy Cooler, [place] 07/02/23 1702    Dorthy Cooler, [place] 07/02/23 1704

## 2023-07-02 NOTE — ED Provider Notes (Incomplete)
[place] EMERGENCY DEPARTMENT AT [facility] Provider Note   CSN: [id number] Arrival date & time: 07/02/23  1610     History  Chief Complaint  Patient presents with   Abdominal Pain   Nausea   Chills    Charles Graves is a 42 y.o. male.   Abdominal Pain Associated symptoms: nausea      42 year old male with medical history significant for brain hemangioma, alcohol abuse now in remission, malingering, subarachnoid bleed who presents to the emergency department with multiple complaints.  The patient states that he has had symptoms of a URI for the last week.  He endorses chills, nausea, mild shortness of breath and abdominal discomfort.  He also states that earlier today he had a roughly 10-minute episode of tingling in his right arm that progressed to numbness in his right arm with associated heaviness in the arm.  Symptoms lasted for around 10 minutes.  No difficulty with speech or swallowing.  Symptoms have now completely resolved.  He also states he has had a productive cough for about the same time with intermittent headaches as well.  No neck stiffness, no fevers.  No vomiting or diarrhea.  Some generalized abdominal discomfort but he is overall tolerating oral intake with some decreased intake of food or today due to nausea.  Home Medications Prior to Admission medications   Medication Sig Start Date End Date Taking? Authorizing Provider  amLODipine (NORVASC) 10 MG tablet Take 1 tablet (10 mg total) by mouth at bedtime. 08/07/22 10/06/22  Mayers, Cari S, PA-C  benztropine (COGENTIN) 2 MG tablet Take 1 tablet (2 mg total) by mouth 2 (two) times daily. 08/21/22   Mayers, Cari S, PA-C  DULoxetine (CYMBALTA) 60 MG capsule Take 1 capsule (60 mg total) by mouth 2 (two) times daily. 08/21/22   Mayers, Cari S, PA-C  hydrOXYzine (ATARAX) 25 MG tablet Take 1/2  - 1 full tablets TID PRN for anxiety 08/21/22   Mayers, Cari S, PA-C  QUEtiapine (SEROQUEL) 100 MG tablet Take 1 tablet  (100 mg total) by mouth at bedtime. 08/07/22 10/06/22  Mayers, Cari S, PA-C      Allergies    Oxycodone hcl and Ativan [lorazepam]    Review of Systems   Review of Systems  Gastrointestinal:  Positive for abdominal pain and nausea.  All other systems reviewed and are negative.   Physical Exam Updated Vital Signs BP (!) 132/104   Pulse 75   Temp 97.8 F (36.6 C)   Resp 19   Ht 5\' 8"  (1.727 m)   Wt 62.6 kg   SpO2 100%   BMI 20.98 kg/m  Physical Exam Vitals and nursing note reviewed.  Constitutional:      General: He is not in acute distress.    Appearance: He is well-developed.  HENT:     Head: Normocephalic and atraumatic.  Eyes:     Conjunctiva/sclera: Conjunctivae normal.  Cardiovascular:     Rate and Rhythm: Normal rate and regular rhythm.     Heart sounds: No murmur heard. Pulmonary:     Effort: Pulmonary effort is normal. No respiratory distress.     Breath sounds: Normal breath sounds.  Abdominal:     Palpations: Abdomen is soft.     Tenderness: There is no abdominal tenderness. There is no guarding.  Musculoskeletal:        General: No swelling.     Cervical back: Neck supple.  Skin:    General: Skin is warm and  dry.     Capillary Refill: Capillary refill takes less than 2 seconds.  Neurological:     Mental Status: He is alert.     Comments: MENTAL STATUS EXAM:    Orientation: Alert and oriented to person, place and time.  Memory: Cooperative, follows commands well.  Language: Speech is clear and language is normal.   CRANIAL NERVES:    CN 2 (Optic): Visual fields intact to confrontation.  CN 3,4,6 (EOM): Pupils equal and reactive to light. Full extraocular eye movement without nystagmus.  CN 5 (Trigeminal): Facial sensation is normal, no weakness of masticatory muscles.  CN 7 (Facial): No facial weakness or asymmetry.  CN 8 (Auditory): Auditory acuity grossly normal.  CN 9,10 (Glossophar): The uvula is midline, the palate elevates symmetrically.  CN  11 (spinal access): Normal sternocleidomastoid and trapezius strength.  CN 12 (Hypoglossal): The tongue is midline. No atrophy or fasciculations.Marland Kitchen   MOTOR:  Muscle Strength: 5/5RUE, 5/5LUE, 5/5RLE, 5/5LLE.   COORDINATION:   Intact finger-to-nose, no tremor.   SENSATION:   Intact to light touch all four extremities.     Psychiatric:        Mood and Affect: Mood normal.     ED Results / Procedures / Treatments   Labs (all labs ordered are listed, but only abnormal results are displayed) Labs Reviewed  RESP PANEL BY RT-PCR (RSV, FLU A&B, COVID)  [id number]    EKG None  Radiology DG Chest 2 View  Result Date: 07/02/2023 CLINICAL DATA:  Cough. EXAM: CHEST - 2 VIEW COMPARISON:  05/02/2022 FINDINGS: The cardiomediastinal contours are normal. The lungs are clear. Pulmonary vasculature is normal. No consolidation, pleural effusion, or pneumothorax. No acute osseous abnormalities are seen. IMPRESSION: Negative radiographs of the chest. Electronically Signed   By: Narda Rutherford M.D.   On: 07/02/2023 20:43    Procedures Procedures    Medications Ordered in ED Medications - No data to display  ED Course/ Medical Decision Making/ A&P                                 Medical Decision Making Amount and/or Complexity of Data Reviewed Labs: ordered. Radiology: ordered.    41 year old male with medical history significant for brain hemangioma, alcohol abuse now in remission, malingering, subarachnoid bleed who presents to the emergency department with multiple complaints.  The patient states that he has had symptoms of a URI for the last week.  He endorses chills, nausea, mild shortness of breath and abdominal discomfort.  He also states that earlier today he had a roughly 10-minute episode of tingling in his right arm that progressed to numbness in his right arm with associated heaviness in the arm.  Symptoms lasted for around 10 minutes.  No difficulty with speech or swallowing.   Symptoms have now completely resolved.  He also states he has had a productive cough for about the same time with intermittent headaches as well.  No neck stiffness, no fevers.  No vomiting or diarrhea.  Some generalized abdominal discomfort but he is overall tolerating oral intake with some decreased intake of food or today due to nausea.  ***  {Document critical care time when appropriate:1} {Document review of labs and clinical decision tools ie heart score, Chads2Vasc2 etc:1}  {Document your independent review of radiology images, and any outside records:1} {Document your discussion with family members, caretakers, and with consultants:1} {Document social determinants of health affecting pt's  care:1} {Document your decision making why or why not admission, treatments were needed:1} Final Clinical Impression(s) / ED Diagnoses Final diagnoses:  None    Rx / DC Orders ED Discharge Orders     None

## 2023-07-03 ENCOUNTER — Ambulatory Visit (HOSPITAL_COMMUNITY): Payer: No Typology Code available for payment source

## 2023-07-03 ENCOUNTER — Encounter (HOSPITAL_COMMUNITY): Payer: Self-pay

## 2023-07-03 ENCOUNTER — Emergency Department (HOSPITAL_COMMUNITY): Payer: BLUE CROSS/BLUE SHIELD

## 2023-07-03 LAB — COMPREHENSIVE METABOLIC PANEL
ALT: 9 U/L (ref 0–44)
AST: 11 U/L — ABNORMAL LOW (ref 15–41)
Albumin: 4.2 g/dL (ref 3.5–5.0)
Alkaline Phosphatase: 60 U/L (ref 38–126)
Anion gap: 9 (ref 5–15)
BUN: 11 mg/dL (ref 6–20)
CO2: 25 mmol/L (ref 22–32)
Calcium: 8.9 mg/dL (ref 8.9–10.3)
Chloride: 102 mmol/L (ref 98–111)
Creatinine, Ser: 0.88 mg/dL (ref 0.61–1.24)
GFR, Estimated: >60 mL/min (ref >60)
Glucose, Bld: 95 mg/dL (ref 70–99)
Potassium: 4 mmol/L (ref 3.5–5.1)
Sodium: 136 mmol/L (ref 135–145)
Total Bilirubin: 0.9 mg/dL (ref 0.3–1.2)
Total Protein: 7 g/dL (ref 6.5–8.1)

## 2023-07-03 LAB — RAPID URINE DRUG SCREEN, HOSP PERFORMED
Amphetamines: NOT DETECTED
Barbiturates: NOT DETECTED
Benzodiazepines: NOT DETECTED
Cocaine: NOT DETECTED
Opiates: NOT DETECTED
Tetrahydrocannabinol: NOT DETECTED

## 2023-07-03 LAB — CBC
HCT: 47.2 % (ref 39.0–52.0)
Hemoglobin: 15.4 g/dL (ref 13.0–17.0)
MCH: 28.7 pg (ref 26.0–34.0)
MCHC: 32.6 g/dL (ref 30.0–36.0)
MCV: 87.9 fL (ref 80.0–100.0)
Platelets: 205 10*3/uL (ref 150–400)
RBC: 5.37 MIL/uL (ref 4.22–5.81)
RDW: 13 % (ref 11.5–15.5)
WBC: 6.6 10*3/uL (ref 4.0–10.5)
nRBC: 0 % (ref 0.0–0.2)

## 2023-07-03 LAB — ETHANOL: Alcohol, Ethyl (B): <10 mg/dL (ref <10)

## 2023-07-03 MED ORDER — IOHEXOL 350 MG/ML SOLN
75.0000 mL | Freq: Once | INTRAVENOUS | Status: AC | PRN
  Administered 2023-07-03: 75 mL via INTRAVENOUS

## 2023-07-03 MED ORDER — ONDANSETRON 4 MG PO TBDP
4.0000 mg | ORAL_TABLET | Freq: Once | ORAL | Status: AC
  Administered 2023-07-03: 4 mg via ORAL
  Filled 2023-07-03: qty 1

## 2023-07-03 MED ORDER — DIPHENHYDRAMINE HCL 50 MG/ML IJ SOLN: 12.5000 mg | Freq: Once | INTRAMUSCULAR | Status: DC

## 2023-07-03 MED ORDER — SODIUM CHLORIDE (PF) 0.9 % IJ SOLN
INTRAMUSCULAR | Status: AC
  Filled 2023-07-03: qty 50

## 2023-07-03 MED ORDER — METOCLOPRAMIDE HCL 5 MG/ML IJ SOLN
10.0000 mg | Freq: Once | INTRAMUSCULAR | Status: AC
  Administered 2023-07-03: 10 mg via INTRAVENOUS
  Filled 2023-07-03: qty 2

## 2023-07-03 MED ORDER — ACETAMINOPHEN 500 MG PO TABS
1000.0000 mg | ORAL_TABLET | Freq: Once | ORAL | Status: AC
  Administered 2023-07-03: 1000 mg via ORAL
  Filled 2023-07-03: qty 2

## 2023-07-03 NOTE — Discharge Instructions (Addendum)
Your CT and MRI imaging was reassuring.  Symptoms are consistent with likely viral upper respiratory infection.  Neurology felt that your symptoms of weakness were less likely transient ischemic attack (mini stroke) and more consistent with complex migraine headache.  Recommend you follow-up with your primary care physician.

## 2023-07-09 ENCOUNTER — Encounter (HOSPITAL_COMMUNITY): Payer: Self-pay | Admitting: Emergency Medicine

## 2023-07-09 ENCOUNTER — Emergency Department (HOSPITAL_COMMUNITY): Payer: BLUE CROSS/BLUE SHIELD

## 2023-07-09 ENCOUNTER — Other Ambulatory Visit: Payer: Self-pay

## 2023-07-09 ENCOUNTER — Emergency Department (HOSPITAL_COMMUNITY)
Admission: EM | Admit: 2023-07-09 | Discharge: 2023-07-09 | Disposition: A | Discharge status: Home / Self Care | Payer: BLUE CROSS/BLUE SHIELD | Attending: Emergency Medicine | Admitting: Emergency Medicine

## 2023-07-09 DIAGNOSIS — Z1152 Encounter for screening for COVID-19: Secondary | ICD-10-CM | POA: Insufficient documentation

## 2023-07-09 DIAGNOSIS — R059 Cough, unspecified: Secondary | ICD-10-CM | POA: Insufficient documentation

## 2023-07-09 DIAGNOSIS — Z79899 Other long term (current) drug therapy: Secondary | ICD-10-CM | POA: Diagnosis not present

## 2023-07-09 DIAGNOSIS — R509 Fever, unspecified: Secondary | ICD-10-CM | POA: Diagnosis not present

## 2023-07-09 DIAGNOSIS — R0600 Dyspnea, unspecified: Secondary | ICD-10-CM | POA: Insufficient documentation

## 2023-07-09 DIAGNOSIS — L539 Erythematous condition, unspecified: Secondary | ICD-10-CM | POA: Diagnosis not present

## 2023-07-09 DIAGNOSIS — R0981 Nasal congestion: Secondary | ICD-10-CM | POA: Insufficient documentation

## 2023-07-09 DIAGNOSIS — J069 Acute upper respiratory infection, unspecified: Secondary | ICD-10-CM

## 2023-07-09 LAB — RESP PANEL BY RT-PCR (RSV, FLU A&B, COVID)  RVPGX2
Influenza A by PCR: NEGATIVE
Influenza B by PCR: NEGATIVE
Resp Syncytial Virus by PCR: NEGATIVE
SARS Coronavirus 2 by RT PCR: NEGATIVE

## 2023-07-09 MED ORDER — BENZONATATE 100 MG PO CAPS: 100.0000 mg | ORAL_CAPSULE | Freq: Three times a day (TID) | ORAL | 0 refills | Status: AC | PRN

## 2023-07-09 MED ORDER — ALBUTEROL SULFATE HFA 108 (90 BASE) MCG/ACT IN AERS: INHALATION_SPRAY | RESPIRATORY_TRACT | 0 refills | Status: AC

## 2023-07-09 MED ORDER — ACETAMINOPHEN 325 MG PO TABS
650.0000 mg | ORAL_TABLET | Freq: Once | ORAL | Status: AC
  Administered 2023-07-09: 650 mg via ORAL
  Filled 2023-07-09: qty 2

## 2023-07-09 NOTE — ED Provider Notes (Signed)
[facility] Provider Note   CSN: [id number] Arrival date & time: 07/09/23  1517     History  Chief Complaint  Patient presents with   Cough    Charles Graves is a 42 y.o. male with history of alcohol abuse who presents to the ED today for cough.  Patient states for the past week he has been having cough, fevers, body aches, and intermittent dyspnea for the past week.  Patient was seen in the ED last week when the symptoms began, but the cough and dyspnea has developed since then.  Patient states his Tmax is 102F. He has been taking Tylenol with some relief. Denies vomiting, abdominal pain, or diarrhea. No additional complaints or concerns at this time.    Home Medications Prior to Admission medications   Medication Sig Start Date End Date Taking? Authorizing Provider  amLODipine (NORVASC) 10 MG tablet Take 1 tablet (10 mg total) by mouth at bedtime. 08/07/22 07/03/23  Mayers, Cari S, PA-C  benztropine (COGENTIN) 2 MG tablet Take 1 tablet (2 mg total) by mouth 2 (two) times daily. Patient not taking: Reported on 07/03/2023 08/21/22   Mayers, Cari S, PA-C  DULoxetine (CYMBALTA) 60 MG capsule Take 1 capsule (60 mg total) by mouth 2 (two) times daily. Patient not taking: Reported on 07/03/2023 08/21/22   Mayers, Cari S, PA-C  hydrOXYzine (ATARAX) 25 MG tablet Take 1/2  - 1 full tablets TID PRN for anxiety Patient not taking: Reported on 07/03/2023 08/21/22   Mayers, Cari S, PA-C  QUEtiapine (SEROQUEL) 100 MG tablet Take 1 tablet (100 mg total) by mouth at bedtime. Patient not taking: Reported on 07/03/2023 08/07/22 10/06/22  Mayers, Cari S, PA-C      Allergies    Oxycodone hcl and Ativan [lorazepam]    Review of Systems   Review of Systems  Respiratory:  Positive for cough.   All other systems reviewed and are negative.   Physical Exam Updated Vital Signs BP 122/81   Pulse 84   Temp 97.9 F (36.6 C) (Oral)   Resp 18   SpO2 100%   Physical Exam Vitals and nursing note reviewed.  Constitutional:      Appearance: Normal appearance. He is ill-appearing.  HENT:     Head: Normocephalic and atraumatic.     Right Ear: Tympanic membrane normal.     Left Ear: Tympanic membrane normal.     Nose: Congestion present.     Mouth/Throat:     Mouth: Mucous membranes are moist.     Pharynx: Oropharynx is clear. Posterior oropharyngeal erythema present. No oropharyngeal exudate.  Eyes:     Conjunctiva/sclera: Conjunctivae normal.     Pupils: Pupils are equal, round, and reactive to light.  Cardiovascular:     Rate and Rhythm: Normal rate and regular rhythm.     Pulses: Normal pulses.     Heart sounds: Normal heart sounds.  Pulmonary:     Effort: Pulmonary effort is normal.     Breath sounds: Normal breath sounds.  Abdominal:     Palpations: Abdomen is soft.     Tenderness: There is no abdominal tenderness.  Lymphadenopathy:     Cervical: No cervical adenopathy.  Skin:    General: Skin is warm and dry.     Findings: No rash.  Neurological:     General: No focal deficit present.     Mental Status: He is alert.     Sensory: No sensory deficit.  Motor: No weakness.  Psychiatric:        Mood and Affect: Mood normal.        Behavior: Behavior normal.    ED Results / Procedures / Treatments   Labs (all labs ordered are listed, but only abnormal results are displayed) Labs Reviewed  RESP PANEL BY RT-PCR (RSV, FLU A&B, COVID)  [id number]    EKG None  Radiology DG Chest 2 View  Result Date: 07/09/2023 CLINICAL DATA:  Severe cough, fever and chills, occasional shortness of breath, and generalized body aches. Onset 4 days ago. EXAM: CHEST - 2 VIEW COMPARISON:  PA Lat chest 07/02/2023 FINDINGS: The heart size and mediastinal contours are within normal limits. Both lungs are clear. The visualized skeletal structures are unremarkable apart from upper to midthoracic mild-to-moderate kyphosis. IMPRESSION: No evidence of  acute chest disease. Stable chest. Thoracic kyphosis. Electronically Signed   By: Almira Bar M.D.   On: 07/09/2023 20:39    Procedures Procedures: not indicated.   Medications Ordered in ED Medications  acetaminophen (TYLENOL) tablet 650 mg (650 mg Oral Given 07/09/23 1751)    ED Course/ Medical Decision Making/ A&P                                 Medical Decision Making Amount and/or Complexity of Data Reviewed Radiology: ordered.  Risk OTC drugs. Prescription drug management.   This patient presents to the ED for concern of cough, this involves an extensive number of treatment options, and is a complaint that carries with it a high risk of complications and morbidity.   Differential diagnosis includes: flu, COVID, RSV, URI, pneumonia, etc.   Comorbidities  See HPI above   Additional History  Additional history obtained from prior ED note.   Lab Tests  I ordered and personally interpreted labs.  The pertinent results include:   Negative for flu, COVID, RSV   Imaging Studies  I ordered imaging studies including CXR  I independently visualized and interpreted imaging which showed: both lungs are clear. No evidence of acute chest disease. I agree with the radiologist interpretation   Problem List / ED Course / Critical Interventions / Medication Management  Cough I ordered medications including: Tylenol for body aches  Reevaluation of the patient after these medicines showed that the patient improved I have reviewed the patients home medicines and have made adjustments as needed   Social Determinants of Health  Tobacco use   Test / Admission - Considered  Discussed findings with patient. All questions were answered. He is hemodynamically stable and safe for discharge home. Return precautions provided.        Final Clinical Impression(s) / ED Diagnoses Final diagnoses:  None    Rx / DC Orders ED Discharge Orders     None          Maxwell Marion, PA-C 07/09/23 2146    Gloris Manchester, MD 07/10/23 0030

## 2023-07-09 NOTE — ED Triage Notes (Signed)
Patient due to sever cough, fever, chills, occasional shortness of breath and general body aches. Symptoms started Thursday of last week.

## 2023-07-09 NOTE — Discharge Instructions (Signed)
You're negative for flu, COVID, RSV, pneumonia, or bronchitis.  I have sent a prescription for Benzonatate to your pharmacy you can take for cough as needed.  Follow up with your PCP in 1 week for reevaluation.  Return to the ED if symptoms worsen in the interim.

## 2023-09-11 IMAGING — CT CT HEAD W/O CM
3 series · 15 of 47 positions shown, 18 images · non-contrast
Comparison: 08/25/2021

CLINICAL DATA: Head trauma, moderate-severe; seizure, fall



[Series 2: head wo · axial · 0.43mm/px · z∈[-148,-18]mm · 9 of 32 slices shown, 12 images]
[im 3/32  brain]
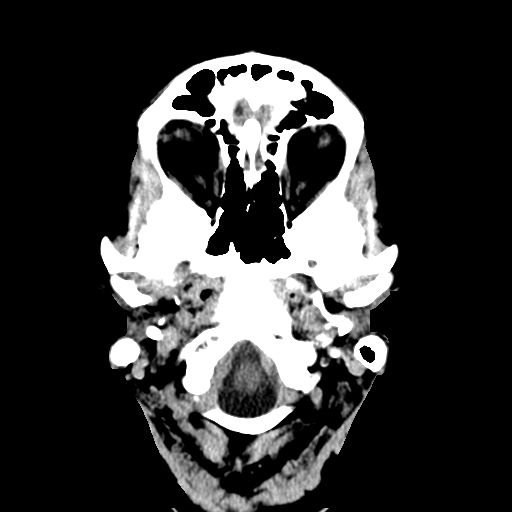
[im 3/32  bone]
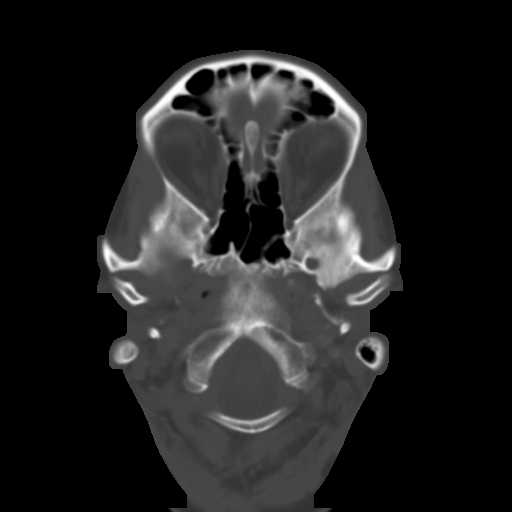
[im 6/32  brain]
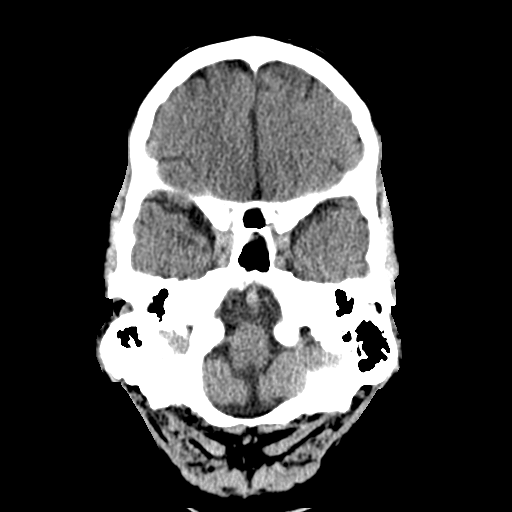
[im 9/32  brain]
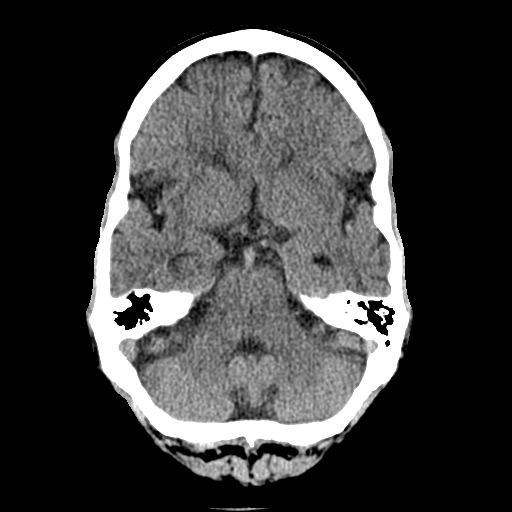
[im 12/32  brain]
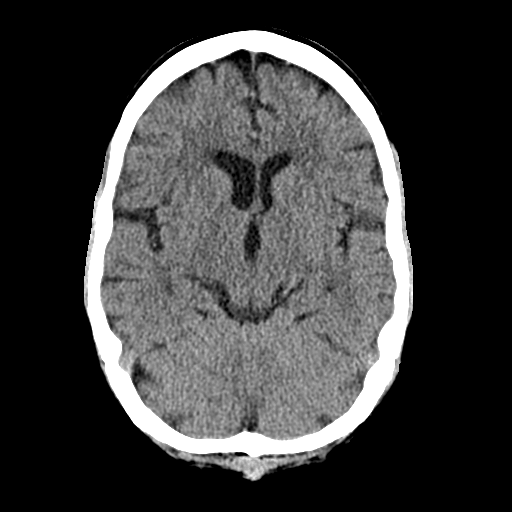
[im 17/32  brain]
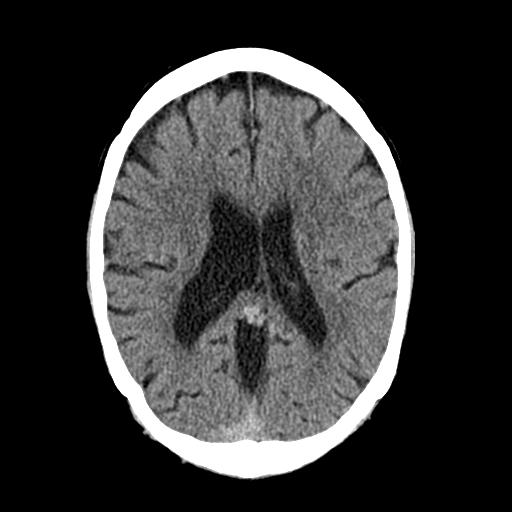
[im 17/32  bone]
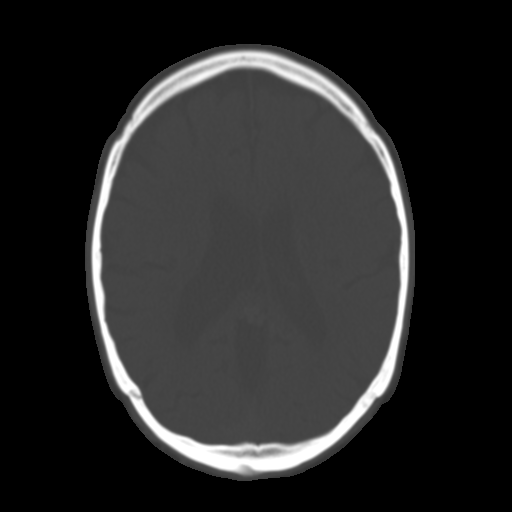
[im 20/32  brain]
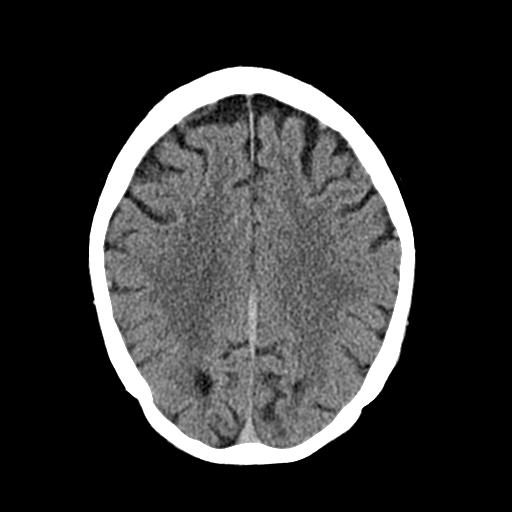
[im 23/32  brain]
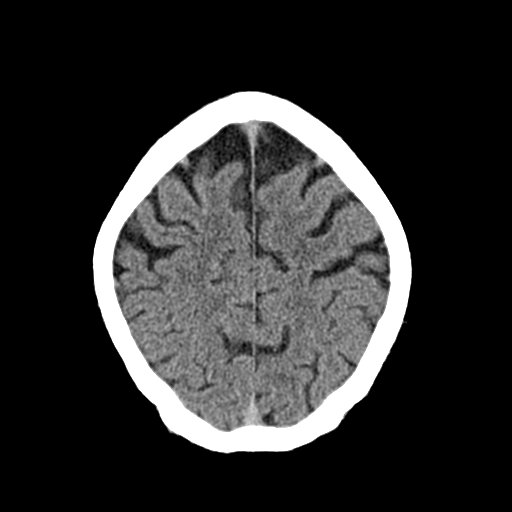
[im 26/32  brain]
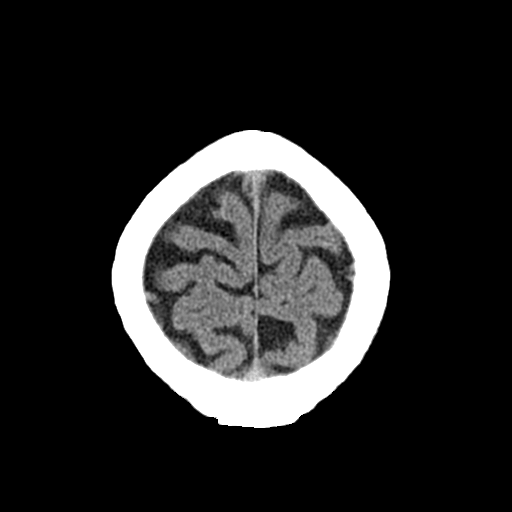
[im 29/32  brain]
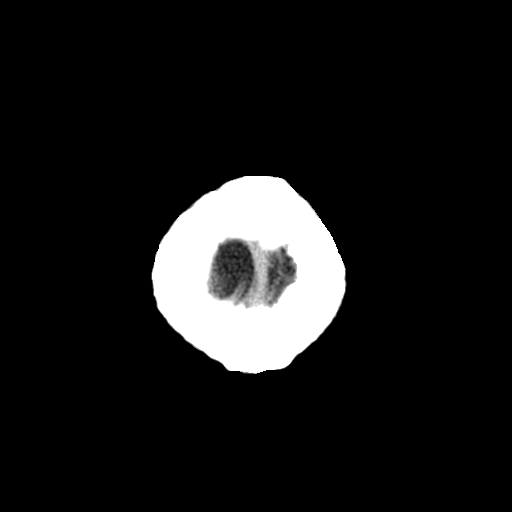
[im 29/32  bone]
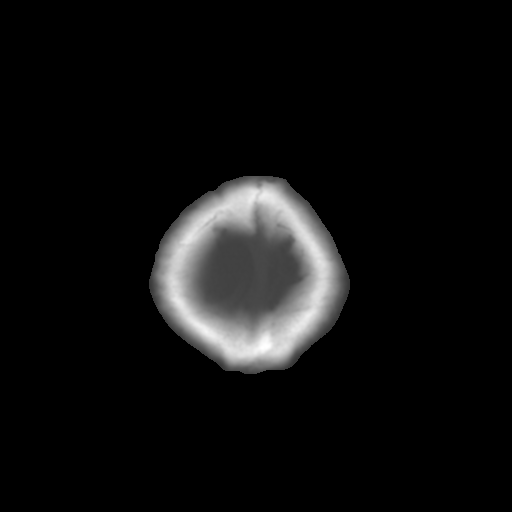

[Series 4: cor soft · coronal · 0.31mm/px · 3 of 70 slices shown]
[im 24/70  brain]
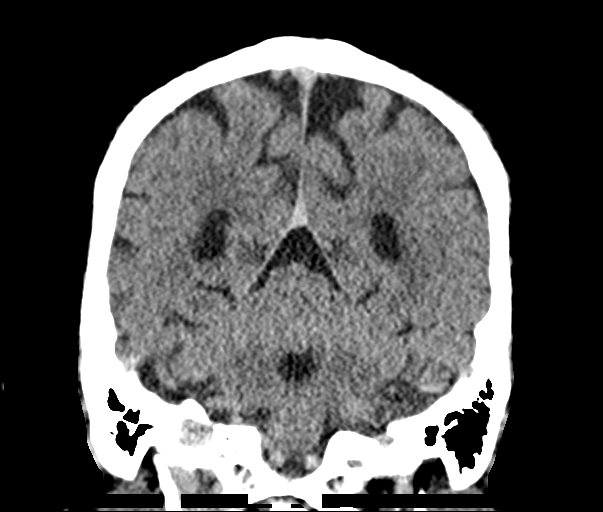
[im 31/70  brain]
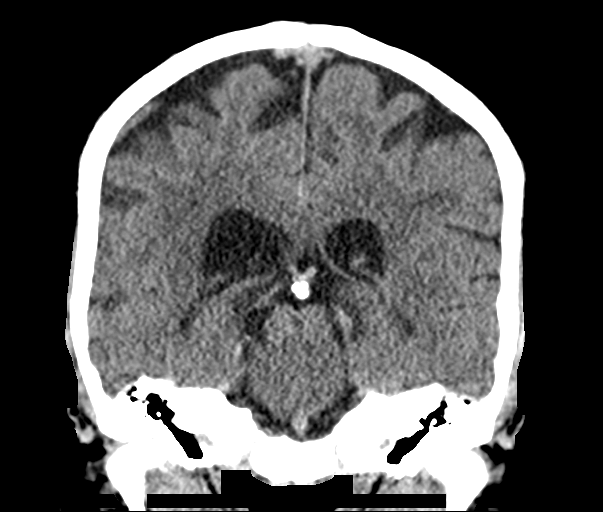
[im 39/70  brain]
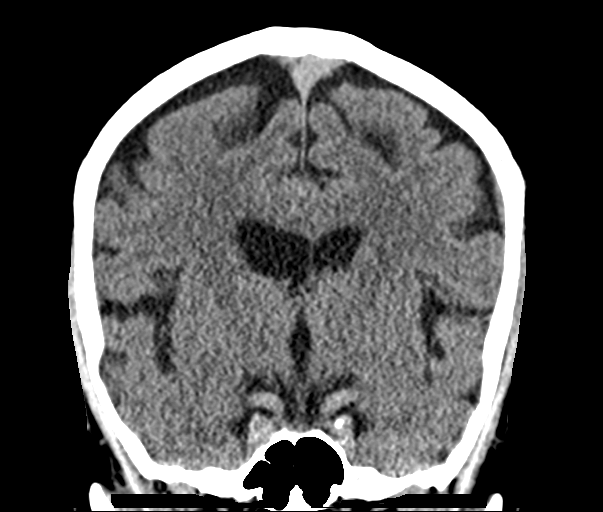

[Series 5: sag soft · sagittal · 0.31mm/px · 3 of 54 slices shown]
[im 18/54  brain]
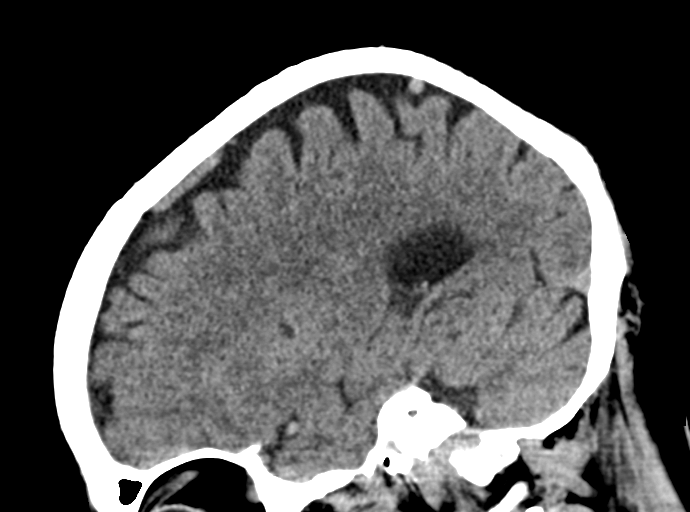
[im 27/54  brain]
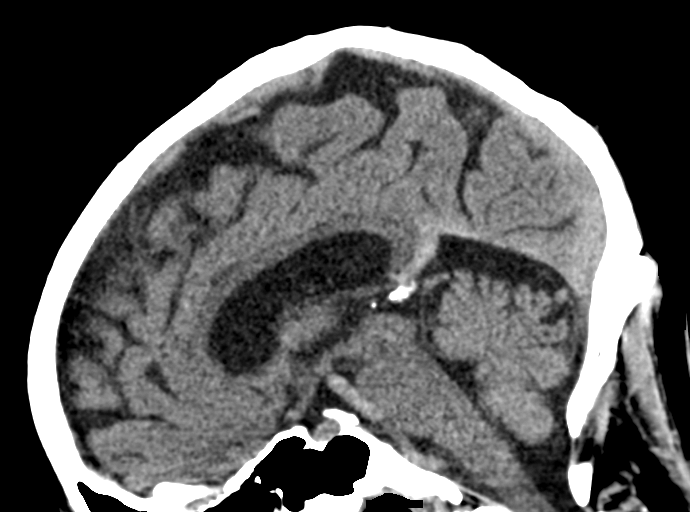
[im 36/54  brain]
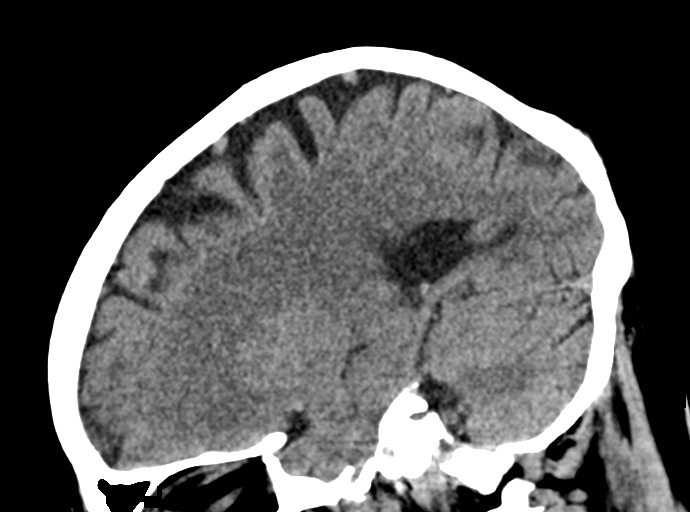

[15 of 47 positions shown; findings below may reference images not displayed]

FINDINGS: Brain: There is no acute intracranial hemorrhage, mass effect, or
edema. Gray-white differentiation is preserved. There is no
extra-axial fluid collection. Ventricles and sulci are stable in
size and configuration.

Vascular: No hyperdense vessel or unexpected calcification.

Skull: Calvarium is unremarkable.

Sinuses/Orbits: No acute finding.

Other: Mastoid air cells are clear. Soft tissue within the external
auditory canal bilaterally presumably reflects cerumen.
IMPRESSION: No acute intracranial abnormality.

## 2024-05-04 ENCOUNTER — Encounter (HOSPITAL_COMMUNITY): Payer: Self-pay | Admitting: Emergency Medicine

## 2024-05-04 ENCOUNTER — Emergency Department (HOSPITAL_COMMUNITY): Payer: MEDICAID

## 2024-05-04 ENCOUNTER — Other Ambulatory Visit: Payer: Self-pay

## 2024-05-04 ENCOUNTER — Observation Stay (HOSPITAL_COMMUNITY)
Admission: EM | Admit: 2024-05-04 | Discharge: 2024-05-04 | Disposition: A | Discharge status: Home / Self Care | Payer: MEDICAID

## 2024-05-04 DIAGNOSIS — R0789 Other chest pain: Secondary | ICD-10-CM | POA: Insufficient documentation

## 2024-05-04 DIAGNOSIS — I10 Essential (primary) hypertension: Secondary | ICD-10-CM | POA: Diagnosis not present

## 2024-05-04 DIAGNOSIS — E871 Hypo-osmolality and hyponatremia: Secondary | ICD-10-CM

## 2024-05-04 DIAGNOSIS — Z79899 Other long term (current) drug therapy: Secondary | ICD-10-CM | POA: Diagnosis not present

## 2024-05-04 DIAGNOSIS — E876 Hypokalemia: Secondary | ICD-10-CM | POA: Diagnosis not present

## 2024-05-04 DIAGNOSIS — F17210 Nicotine dependence, cigarettes, uncomplicated: Secondary | ICD-10-CM

## 2024-05-04 DIAGNOSIS — E785 Hyperlipidemia, unspecified: Secondary | ICD-10-CM | POA: Diagnosis not present

## 2024-05-04 DIAGNOSIS — F32A Depression, unspecified: Secondary | ICD-10-CM

## 2024-05-04 DIAGNOSIS — Z720 Tobacco use: Secondary | ICD-10-CM

## 2024-05-04 DIAGNOSIS — R202 Paresthesia of skin: Principal | ICD-10-CM | POA: Insufficient documentation

## 2024-05-04 DIAGNOSIS — R200 Anesthesia of skin: Secondary | ICD-10-CM | POA: Diagnosis not present

## 2024-05-04 DIAGNOSIS — E782 Mixed hyperlipidemia: Secondary | ICD-10-CM | POA: Diagnosis not present

## 2024-05-04 DIAGNOSIS — Z8679 Personal history of other diseases of the circulatory system: Secondary | ICD-10-CM

## 2024-05-04 HISTORY — DX: Hyperlipidemia, unspecified: E78.5

## 2024-05-04 LAB — CBC
HCT: 47.4 % (ref 39.0–52.0)
Hemoglobin: 15.3 g/dL (ref 13.0–17.0)
MCH: 29.2 pg (ref 26.0–34.0)
MCHC: 32.3 g/dL (ref 30.0–36.0)
MCV: 90.5 fL (ref 80.0–100.0)
Platelets: 280 K/uL (ref 150–400)
RBC: 5.24 MIL/uL (ref 4.22–5.81)
RDW: 13.2 % (ref 11.5–15.5)
WBC: 10.5 K/uL (ref 4.0–10.5)
nRBC: 0 % (ref 0.0–0.2)

## 2024-05-04 LAB — RAPID URINE DRUG SCREEN, HOSP PERFORMED
Amphetamines: NOT DETECTED
Barbiturates: NOT DETECTED
Benzodiazepines: NOT DETECTED
Cocaine: NOT DETECTED
Opiates: NOT DETECTED
Tetrahydrocannabinol: NOT DETECTED

## 2024-05-04 LAB — COMPREHENSIVE METABOLIC PANEL WITH GFR
ALT: 11 U/L (ref 0–44)
AST: 13 U/L — ABNORMAL LOW (ref 15–41)
Albumin: 4.2 g/dL (ref 3.5–5.0)
Alkaline Phosphatase: 64 U/L (ref 38–126)
Anion gap: 9 (ref 5–15)
BUN: 10 mg/dL (ref 6–20)
CO2: 24 mmol/L (ref 22–32)
Calcium: 8.9 mg/dL (ref 8.9–10.3)
Chloride: 98 mmol/L (ref 98–111)
Creatinine, Ser: 0.85 mg/dL (ref 0.61–1.24)
GFR, Estimated: >60 mL/min (ref >60)
Glucose, Bld: 99 mg/dL (ref 70–99)
Potassium: 3.4 mmol/L — ABNORMAL LOW (ref 3.5–5.1)
Sodium: 131 mmol/L — ABNORMAL LOW (ref 135–145)
Total Bilirubin: 0.7 mg/dL (ref 0.0–1.2)
Total Protein: 7 g/dL (ref 6.5–8.1)

## 2024-05-04 LAB — URINALYSIS, ROUTINE W REFLEX MICROSCOPIC
Bilirubin Urine: NEGATIVE
Glucose, UA: NEGATIVE mg/dL
Hgb urine dipstick: NEGATIVE
Ketones, ur: NEGATIVE mg/dL
Leukocytes,Ua: NEGATIVE
Nitrite: NEGATIVE
Protein, ur: NEGATIVE mg/dL
Specific Gravity, Urine: 1.005 (ref 1.005–1.030)
pH: 7 (ref 5.0–8.0)

## 2024-05-04 LAB — I-STAT CHEM 8, ED
BUN: 8 mg/dL (ref 6–20)
Calcium, Ion: 1.19 mmol/L (ref 1.15–1.40)
Chloride: 99 mmol/L (ref 98–111)
Creatinine, Ser: 0.9 mg/dL (ref 0.61–1.24)
Glucose, Bld: 96 mg/dL (ref 70–99)
HCT: 47 % (ref 39.0–52.0)
Hemoglobin: 16 g/dL (ref 13.0–17.0)
Potassium: 3.7 mmol/L (ref 3.5–5.1)
Sodium: 137 mmol/L (ref 135–145)
TCO2: 25 mmol/L (ref 22–32)

## 2024-05-04 LAB — PROTIME-INR
INR: 1 (ref 0.8–1.2)
Prothrombin Time: 13.7 s (ref 11.4–15.2)

## 2024-05-04 LAB — CBG MONITORING, ED: Glucose-Capillary: 112 mg/dL — ABNORMAL HIGH (ref 70–99)

## 2024-05-04 LAB — APTT: aPTT: 30 s (ref 24–36)

## 2024-05-04 LAB — ETHANOL: Alcohol, Ethyl (B): <15 mg/dL (ref <15)

## 2024-05-04 LAB — TROPONIN I (HIGH SENSITIVITY): Troponin I (High Sensitivity): 3 ng/L (ref <18)

## 2024-05-04 MED ORDER — MELATONIN 3 MG PO TABS: 3.0000 mg | ORAL_TABLET | Freq: Every evening | ORAL | Status: DC | PRN

## 2024-05-04 MED ORDER — ACETAMINOPHEN 325 MG PO TABS: 650.0000 mg | ORAL_TABLET | Freq: Four times a day (QID) | ORAL | Status: DC | PRN

## 2024-05-04 MED ORDER — ONDANSETRON HCL 4 MG/2ML IJ SOLN: 4.0000 mg | Freq: Four times a day (QID) | INTRAMUSCULAR | Status: DC | PRN

## 2024-05-04 MED ORDER — ACETAMINOPHEN 650 MG RE SUPP: 650.0000 mg | Freq: Four times a day (QID) | RECTAL | Status: DC | PRN

## 2024-05-04 MED ORDER — HYDRALAZINE HCL 20 MG/ML IJ SOLN: 10.0000 mg | INTRAMUSCULAR | Status: DC | PRN

## 2024-05-04 MED ORDER — ASPIRIN 81 MG PO TBEC: 81.0000 mg | DELAYED_RELEASE_TABLET | Freq: Every day | ORAL | Status: DC

## 2024-05-04 NOTE — Consult Note (Signed)
 [facility] Consult Services   Patient Name:   Charles Graves, Charles Graves Date of Birth:   October 07, 1980 Date of Service:   05/04/2024 20:32:34  Diagnosis:       R20.2 - Paresthesia of skin  Impression:      43 year old male who presents to the hospital because of left side paresthesia in the left leg. Presentation concerning for small vessel stroke.  Our recommendations are outlined below.  Recommendations:        Stroke/Telemetry Floor       Neuro Checks (Q4)       Bedside Swallow Eval       DVT Prophylaxis       IV Fluids, Normal Saline       Head of Bed 30 Degrees       Euglycemia and Avoid Hyperthermia (PRN Acetaminophen )       Initiate or continue Aspirin  81 MG daily       Antihypertensives PRN if Blood pressure is greater than 220/120 or there is a concern for End organ damage/contraindications for permissive HTN. If blood pressure is greater than 220/120 give labetalol  PO or IV or Vasotec IV with a goal of 15% reduction in BP during the first 24 hours.  Sign Out:       Discussed with Emergency Department Provider    ------------------------------------------------------------------------------  Advanced Imaging: Advanced Imaging Deferred because:  Non-disabling symptoms as verified by the patient; no cortical signs so not consistent with LVO   Metrics: Last Known Well: 05/04/2024 08:00:00 Dispatch Time: 05/04/2024 20:32:34 Arrival Time: 05/04/2024 19:59:00 Initial Response Time: 05/04/2024 20:40:32 Symptoms: left side paresthesia. Initial patient interaction: 05/04/2024 20:51:57 NIHSS Assessment Completed: 05/04/2024 20:55:00 Patient is not a candidate for Thrombolytic. Thrombolytic Medical Decision: 05/04/2024 20:55:00 Patient was not deemed candidate for Thrombolytic because of following reasons: LKW outside 4.5 hr window. .  CT Head: I personally reviewed all the CT images that were available to me and it showed: No acute hemorrhage  or large territory infarct.  Primary Provider Notified of Diagnostic Impression and Management Plan on: 05/04/2024 21:08:04    ------------------------------------------------------------------------------  History of Present Illness: Patient is a 43 year old Male.  Patient was brought by private transportation with symptoms of left side paresthesia. 43 year old male with a history of HTN who presnets to the hospital because of left side numbness and tingling. This morning he had a 5 minute episode of tingling and numbness in the left side of his body as well as mild nausea. He felt back to normal throughout the day and then at 19:30 while laying down on the couch he noticed recurrent numbness and tingling on the left. On exam patient had decreased sensation in the left leg but no other drift or limb ataxia or changes in speech.  He did noticed some tingling in the left leg intermittently throughout the day.   Past Medical History:      Hypertension  Medications:  No Anticoagulant use  No Antiplatelet use Reviewed EMR for current medications  Allergies:  Reviewed  Social History: Drug Use: No  Family History:  There is no family history of premature cerebrovascular disease pertinent to this consultation  ROS : 14 Points Review of Systems was performed and was negative except mentioned in HPI.  Past Surgical History: There Is No Surgical History Contributory To Today's Visit    Examination: BP(121/97), Pulse(76), 1A: Level of Consciousness - Alert; keenly responsive + 0 1B: Ask Month and Age - Both  Questions Right + 0 1C: Blink Eyes & Squeeze Hands - Performs Both Tasks + 0 2: Test Horizontal Extraocular Movements - Normal + 0 3: Test Visual Fields - No Visual Loss + 0 4: Test Facial Palsy (Use Grimace if Obtunded) - Normal symmetry + 0 5A: Test Left Arm Motor Drift - No Drift for 10 Seconds + 0 5B: Test Right Arm Motor Drift - No Drift for 10 Seconds + 0 6A: Test  Left Leg Motor Drift - No Drift for 5 Seconds + 0 6B: Test Right Leg Motor Drift - No Drift for 5 Seconds + 0 7: Test Limb Ataxia (FNF/Heel-Shin) - No Ataxia + 0 8: Test Sensation - Mild-Moderate Loss: Less Sharp/More Dull + 1 9: Test Language/Aphasia - Normal; No aphasia + 0 10: Test Dysarthria - Normal + 0 11: Test Extinction/Inattention - No abnormality + 0  NIHSS Score: 1   Pre-Morbid Modified Rankin Scale: 1 Points = No significant disability despite symptoms; able to carry out all usual duties and activities  Spoke with : Dr. Gennaro  This consult was conducted in real time using interactive audio and Immunologist. Patient was informed of the technology being used for this visit and agreed to proceed. Patient located in hospital and provider located at home/office setting.   Patient is being evaluated for possible acute neurologic impairment and high probability of imminent or life-threatening deterioration. I spent total of 30 minutes providing care to this patient, including time for face to face visit via telemedicine, review of medical records, imaging studies and discussion of findings with providers, the patient and/or family.    Dr Kerri Gables   [facility] For Inpatient follow-up with [facility] physician please call [facility] at [phone]. As we are not an outpatient service for any post hospital discharge needs please contact the hospital for assistance. If you have any questions for the [facility] physicians or need to reconsult for clinical or diagnostic changes please contact us  via [facility] at [phone].   Signature : Kerri Gables

## 2024-05-04 NOTE — ED Notes (Signed)
Pt made aware of need for urine specimen 

## 2024-05-04 NOTE — ED Notes (Signed)
 Patient speaking with tele stroke at this time.

## 2024-05-04 NOTE — H&P (Signed)
 History and Physical AND Discharge Summary   Of note as this patient left Against Medical Advice [facility]) within the same calendar day of his admission, the following document will serve both as admission H&P as well as Discharge Summary.    Rondale Nies FMW:[id number] DOB: May 30, 1981 DOA: 05/04/2024; DOS: 05/04/2024  PCP: Loris Elsie PARAS, PA-C  Patient coming from: home   I have personally briefly reviewed patient's old medical records in [facility]  Chief Complaint: Left upper extremity numbness  HPI: Charles Graves is a 43 y.o. male with medical history significant for chronic left shoulder pain, subarachnoid hemorrhage, send hypertension, hyperlipidemia, chronic tobacco abuse, who is admitted to [facility] on 05/04/2024 with left upper extremity numbness after presenting from home to [facility] ED complaining of left upper extremity numbness.   The patient reports acute onset of left upper extremity numbness starting at 1915 this evening, noting that the distribution of numbness is throughout the totality of his left upper extremity and has been persistent, without any significant ensuing improvement following initial onset.  He conveys that he has never previously experienced similar unilateral numbness, and denies involvement in any other extremity at this time nor any involvement in the left portion of the face.  Denies any associated acute focal weakness or any associated acute visual change, diplopia, facial droop, dysarthria, expressive aphasia, vertigo, or dysphagia.   He denies any associated chest pain, shortness of breath, palpitations, diaphoresis, dizziness, presyncope, or syncope.  No recent trauma and denies any recent neck pain or known history of degenerative disc disease.  He has documentation of a history of subarachnoid hemorrhage, but denies any known history of acute ischemic stroke.  Medical history notable for essential hypertension, hyperlipidemia, for which she has  recently been started on rosuvastatin 5 mg p.o. daily.  He also has a history of chronic tobacco abuse, given that he continues to smoke half pack per day.  Denies any known history of diabetes, paroxysmal atrial fibrillation, or obstructive sleep apnea.  Not on any blood thinners as an outpatient, including no aspirin .   [facility] ED Course:  Vital signs in the ED were notable for the following: Afebrile; heart rates in the 70s; systolic blood pressures in the 120s; respiratory rate 18, oxygen saturation 100% on room air.  Labs were notable for the following: CMP was notable for the following: Sodium 131 compared to most recent prior value of 136 on 07/03/2023, potassium 3.4, bicarbonate 24, creatinine 0.85, glucose 99, liver enzymes within normal limits.  High sensitive troponin I was noted to be 3.  Serum ethanol level less than 15.  CBC notable for white cell count 10,500, hemoglobin 15.3 I count 280.  INR 1.0, PTT 30.  Urinalysis showed no white blood cells and was leukocyte esterase/nitrate negative.  Drug screen was pan negative.  Per my interpretation, EKG in ED demonstrated the following: Sinus rhythm with heart rate 87, normal intervals, nonspecific T wave inversion in aVL, will demonstrate no evidence of ST changes, including no evidence of ST elevation.  Imaging in the ED, per corresponding formal radiology read, was notable for the following: Noncontrast CT head showed no evidence of acute intracranial process, including no evidence of intracranial hemorrhage or any evidence of acute infarct.  EDP d/w on-call teleneurologist, Dr. Donata, who recommended admission for further evaluation of potential TIA vs small vessel ischemic stroke, including MRI brain, and conveyed that patient is not a candidate for TPA given the limited severity of the pt's  symptoms, with NIH 1. In the meantime, Dr. Donata also recommended q4h neuro checks, initiating daily baby aspirin , and permissive hypertension.    While in the ED, the following were administered: No medications or IV fluids were administered in the ED today.  Subsequently, the patient was admitted to [facility] for further evaluation and management of the presenting acute onset left upper extremity numbness, including further evaluation for TIA versus small vessel acute ischemic CVA, with presenting labs also notable for acute hyponatremia as well as hypokalemia.     Review of Systems: As per HPI otherwise 10 point review of systems negative.   Past Medical History:  Diagnosis Date   Alcohol abuse    Alcohol abuse 11/03/2021   Brain hemangioma (HCC)    Chronic left shoulder pain    HLD (hyperlipidemia)    Malingering    Subarachnoid bleed (HCC)    Suicidal thoughts     History reviewed. No pertinent surgical history.  Social History:  reports that he has been smoking cigarettes. He has never used smokeless tobacco. He reports that he does not currently use alcohol. He reports that he does not currently use drugs.   Allergies  Allergen Reactions   Oxycodone  Hcl Diarrhea, Nausea And Vomiting and Swelling   Ativan  [Lorazepam ] Nausea And Vomiting    Family history reviewed and not pertinent    Prior to Admission medications   Medication Sig Start Date End Date Taking? Authorizing Provider  amLODipine -benazepril (LOTREL) 5-40 MG capsule Take 1 capsule by mouth daily. 08/22/23 08/21/24 Yes [provider]  cloNIDine (CATAPRES) 0.1 MG tablet Take 0.1 mg by mouth See admin instructions.  Take 1 tablet (0.1 mg total) by mouth 2 (two) times a day as needed for high blood pressure (>160/90). 11/23/23  Yes [provider]  rizatriptan (MAXALT-MLT) 5 MG disintegrating tablet Take 5 mg by mouth as needed for migraine. 04/17/24  Yes [provider]  rosuvastatin (CRESTOR) 5 MG tablet Take 5 mg by mouth daily. 04/18/24 04/18/25 Yes [provider]  venlafaxine XR (EFFEXOR-XR) 75 MG 24 hr capsule Take  75 mg by mouth daily. 04/17/24  Yes [provider]  Vitamin D, Ergocalciferol, (DRISDOL) 1.25 MG (50000 UNIT) CAPS capsule Take 50,000 Units by mouth See admin instructions. One capsule twice a week for 6 weeks then once a week 08/30/23  Yes [provider]  albuterol  (VENTOLIN  HFA) 108 (90 Base) MCG/ACT inhaler 1-2 puffs as needed 07/09/23   Waddell Sluder, PA-C  amLODipine  (NORVASC ) 10 MG tablet Take 1 tablet (10 mg total) by mouth at bedtime. 08/07/22 07/03/23  Mayers, Cari S, PA-C  benztropine  (COGENTIN ) 2 MG tablet Take 1 tablet (2 mg total) by mouth 2 (two) times daily. Patient not taking: Reported on 07/03/2023 08/21/22   Mayers, Cari S, PA-C  DULoxetine  (CYMBALTA ) 60 MG capsule Take 1 capsule (60 mg total) by mouth 2 (two) times daily. Patient not taking: Reported on 07/03/2023 08/21/22   Mayers, Cari S, PA-C  hydrOXYzine  (ATARAX ) 25 MG tablet Take 1/2  - 1 full tablets TID PRN for anxiety Patient not taking: Reported on 07/03/2023 08/21/22   Mayers, Cari S, PA-C  QUEtiapine  (SEROQUEL ) 100 MG tablet Take 1 tablet (100 mg total) by mouth at bedtime. Patient not taking: Reported on 07/03/2023 08/07/22 10/06/22  Darcus Kirk RAMAN, PA-C     Objective    Physical Exam: Vitals:   05/04/24 2006 05/04/24 2006  BP:  (!) 121/97  Pulse:  76  Resp:  18  Temp: 98.3 F (36.8 C)   TempSrc: Oral   SpO2:  100%    General: appears to be stated age; alert, oriented Skin: warm, dry, no rash Head:  AT/Adrian Mouth:  Oral mucosa membranes appear moist, normal dentition Neck: supple; trachea midline Heart:  RRR; did not appreciate any M/R/G Lungs: CTAB, did not appreciate any wheezes, rales, or rhonchi Abdomen: + BS; soft, ND, NT Vascular: 2+ pedal pulses b/l; 2+ radial pulses b/l Extremities: no peripheral edema, no muscle wasting Neuro: strength and sensation intact in upper and lower extremities b/l; diminished sensation to light touch involving the left upper extremity.   Otherwise, sensation to light touch was intact in the right upper extremity as well as the bilateral lower extremities. no evidence suggestive of slurred speech, dysarthria, or facial droop; Normal muscle tone. No tremors.    Labs on Admission: I have personally reviewed following labs and imaging studies  CBC: Recent Labs  Lab 05/04/24 2036 05/04/24 2040  WBC  --  10.5  HGB 16.0 15.3  HCT 47.0 47.4  MCV  --  90.5  PLT  --  280   Basic Metabolic Panel: Recent Labs  Lab 05/04/24 2036 05/04/24 2040  NA 137 131*  K 3.7 3.4*  CL 99 98  CO2  --  24  GLUCOSE 96 99  BUN 8 10  CREATININE 0.90 0.85  CALCIUM  --  8.9   GFR: CrCl cannot be calculated (Unknown ideal weight.). Liver Function Tests: Recent Labs  Lab 05/04/24 2040  AST 13*  ALT 11  ALKPHOS 64  BILITOT 0.7  PROT 7.0  ALBUMIN 4.2   No results for input(s): LIPASE, AMYLASE in the last 168 hours. No results for input(s): AMMONIA in the last 168 hours. Coagulation Profile: Recent Labs  Lab 05/04/24 2041  INR 1.0   Cardiac Enzymes: No results for input(s): CKTOTAL, CKMB, CKMBINDEX, TROPONINI in the last 168 hours. BNP (last 3 results) No results for input(s): PROBNP in the last 8760 hours. HbA1C: No results for input(s): HGBA1C in the last 72 hours. CBG: Recent Labs  Lab 05/04/24 2017  GLUCAP 112*   Lipid Profile: No results for input(s): CHOL, HDL, LDLCALC, TRIG, CHOLHDL, LDLDIRECT in the last 72 hours. Thyroid  Function Tests: No results for input(s): TSH, T4TOTAL, FREET4, T3FREE, THYROIDAB in the last 72 hours. Anemia Panel: No results for input(s): VITAMINB12, FOLATE, FERRITIN, TIBC, IRON, RETICCTPCT in the last 72 hours. Urine analysis:    Component Value Date/Time   COLORURINE STRAW (A) 05/04/2024 2038   APPEARANCEUR CLEAR 05/04/2024 2038   LABSPEC 1.005 05/04/2024 2038   PHURINE 7.0 05/04/2024 2038   GLUCOSEU NEGATIVE 05/04/2024 2038    HGBUR NEGATIVE 05/04/2024 2038   BILIRUBINUR NEGATIVE 05/04/2024 2038   KETONESUR NEGATIVE 05/04/2024 2038   PROTEINUR NEGATIVE 05/04/2024 2038   NITRITE NEGATIVE 05/04/2024 2038   LEUKOCYTESUR NEGATIVE 05/04/2024 2038    Radiological Exams on Admission: CT HEAD CODE STROKE WO CONTRAST Result Date: 05/04/2024 CLINICAL DATA:  Code stroke.  Neuro deficit, acute, stroke suspected EXAM: CT HEAD WITHOUT CONTRAST TECHNIQUE: Contiguous axial images were obtained from the base of the skull through the vertex without intravenous contrast. RADIATION DOSE REDUCTION: This exam was performed according to the departmental dose-optimization program which includes automated exposure control, adjustment of the mA and/or kV according to patient size and/or use of iterative reconstruction technique. COMPARISON:  CT July 03, 2023. FINDINGS: Brain: No evidence of acute large vascular territory infarction, hemorrhage, hydrocephalus, extra-axial  collection or mass lesion/mass effect. Vascular: No hyperdense vessel identified. Skull: No acute fracture. Sinuses/Orbits: Clear sinuses.  No acute orbital findings. ASPECTS [facility] Stroke Program Early CT Score) Total score (0-10 with 10 being normal): 10. IMPRESSION: No evidence of acute intracranial abnormality. ASPECTS is 10. Electronically Signed   By: Gilmore GORMAN Molt M.D.   On: 05/04/2024 20:45      Assessment/Plan   Principal Problem:   Left upper extremity numbness Active Problems:   Tobacco abuse   HLD (hyperlipidemia)   Acute hyponatremia   Hypokalemia   History of essential hypertension   Depression     OF NOTE THE PATIENT LEFT THE HOSPITAL AGAINST MEDICAL ADVICE (AMA) PRIOR TO ENACTMENT OF THE TOTALITY OF THE FOLLOWING PLAN. CONSEQUENTLY, THE FOLLOWING ASSESSMENT AND PLAN REPRESENTS THE ANTICIPATED PLAN THAT WOULD OTHERWISE HAVE BEEN ENACTED IF THE PATIENT HAD NOT LEFT THE HOSPITAL AMA.       #) Acute left upper extremity numbness: The patient  presented with acute onset of left upper extremity numbness starting at 1915 on 05/04/2024, representing last known well in the setting of persistence of the symptoms.  No evidence of additional acute focal neurologic deficits.  CT head showed no evidence of acute intracranial process, including no evidence of intracranial hemorrhage or any evidence of acute infarct.  However, given the abrupt left upper extremity numbness in the setting of several ischemic CVA risk factors, presentation is concerning for small vessel acute ischemic CVA versus TIA.   EDP d/w on-call teleneurologist, Dr. Donata, who recommended admission for further evaluation of potential TIA vs small vessel ischemic stroke, including MRI brain, and conveyed that patient is not a candidate for TPA given the limited severity of the pt's symptoms, with NIH 1. In the meantime, Dr. Donata also recommended q4h neuro checks, initiating daily baby aspirin , and permissive hypertension.   Should have MRI brain subsequently show evidence of acute ischemic infarct, will then pursue completion of the acute ischemic CVA order set, including pursuit of imaging of the neck, echocardiogram with bubble study, as well as further evaluation and management of potential modifiable ischemic CVA risk factors, including checking of hemoglobin A1c as well as lipid panel at that time.  The patient's known modifiable CV risk factors include a history of essential pretension, hyperlipidemia, and he is also noted to be a current/long-term cigarette smoker.  No known history of paroxysmal atrial fibrillation, diabetes, obstructive sleep apnea.  Presenting EKG shows sinus rhythm without overt evidence of acute ischemic changes.  Not on any blood thinners as an outpatient, including no aspirin .  While the patient reports no recent trauma, no recent neck discomfort, and no history of degenerative disc disease, may also consider CT of the cervical spine if MRI brain is  negative for acute ischemic stroke.    Plan: will admit to [facility], with plan for formal in-person neurology consult. Nursing bedside swallow evaluation x 1 now, and will not initiate oral medications or diet until the patient has passed this. Head of the bed at 30 degrees. Neuro checks per protocol. VS per protocol.  Per recommendation of teleneurologist, will allow for permissive hypertension for 48 hours during which will hold home antihypertensive medications, with prn IV hydralazine  ordered for SBP greater than 220 mmHg or DBP greater than 120 mmHg until 1915 on 05/06/2024. Monitor on telemetry, including monitoring for atrial fibrillation as modifiable risk factor for acute ischemic CVA.   MRI brain.  PT/OT consults ordered.  Per recommendation of teleneurologist, will  initiate daily baby aspirin , with possibility of escalating to brief course of dual antiplatelet therapy considering MRI brain shows evidence of acute ischemic stroke.  Counseled the patient on importance of smoking discontinuation.  Fall precautions ordered.                  #) Acute hypo-osmolar euvolemic hyponatremia: Presenting sodium 131 compared to most recent prior value of 136 on 07/03/2023.  Patient appears relatively euvolemic at this time and no overt evidence of hypervolemia.  Differential includes SIADH, including potential contribution from TIA versus acute ischemic CVA in the context of his presenting acute onset left upper extremity numbness, as further detailed above.  Consequently, we will refrain from IV fluids at this time, rather pursue additional diagnostic evaluation as further outlined below.  The patient denies any alcohol consumption for nearly 2 years.  There may be a pharmacologic contribution in the setting of outpatient use of Effexor.  Plan: monitor strict I's and O's and daily weights.  check random urine sodium, urine osmolality.  Check serum osmolality to confirm suspected hypoosmolar  etiology.  Repeat CMP in the morning. Check TSH. Check serum uric acid level, as SIADH can be associated with hypouricemia due to hyperuricuria.  Further evaluation management of presenting acute left upper extremity numbness, including MRI brain.  Every 4 hours neurochecks, as above.  Hold home Effexor for now.                      #) Hypokalemia: presenting potassium level noted to be 3.4.    Plan: monitor on tele. KCl 40 meq p.o. x 1 dose. Add-on serum mag level. CMP, mag level in the AM.                      #) Essential Hypertension: documented h/o such, with outpatient antihypertensive regimen including benazepril, amlodipine .  SBP's in the ED today: 120s mmHg. however, will hold home and hypertensive medications for now given teleneurology's recommendation for permissive hypertension pending further evaluation for potential TIA versus acute ischemic CVA, as above.  Plan: Close monitoring of subsequent BP via routine VS. hold home benazepril and amlodipine  for now.  Permissive hypertension for 48 hours from starting time of 1915 on 05/05/23, with interval prn IV hydralazine  for systolic blood pressure greater than 220 mmHg or DBP greater than 120 mmHg, as further detailed above.  MRI brain.  Monitor strict I's and O's and daily weights.                      #) Hyperlipidemia: documented h/o such. On rosuvastatin as outpatient.  Offensively MRI brain demonstrates evidence of acute ischemic stroke, will pursue updated lipid panel at that time a component of evaluation of modifiable ischemic CVA risk factors.  Plan: continue home statin.                      #) Chronic tobacco abuse: Patient conveys that they are a current smoker, continuing to smoke half pack per day.  Notable in the context of his presenting acute left upper extremity numbness concerning for TIA versus small vessel acute ischemic CVA.   Plan: Counseled  the patient for 3-5 minutes on the importance of complete smoking discontinuation.  Order placed for prn nicotine  patch as well as prn Nicorette  gum for use during this hospitalization.                       #)  Depression: documented h/o such. On Effexor as outpatient.  However, in the setting of presenting acute hyponatremia, will hold home Effexor for now.  Plan: Hold home Effexor for now.  Further evaluation management of presenting acute hyponatremia, as above.  Repeat CMP in the morning.     DVT prophylaxis: SCD's   Code Status: Full code Family Communication: none Disposition Plan: Per Rounding Team Consults called: EDP d/w on-call teleneurologist, Dr. Donata, who recommended admission for further evaluation of potential TIA vs small vessel ischemic stroke, including MRI brain, and conveyed that patient is not a candidate for TPA given the limited severity of the pt's symptoms, with NIH 1. In the meantime, Dr. Donata also recommended q4h neuro checks, initiating daily baby aspirin , and permissive hypertension. ;  Admission status: Admit to PCU at [facility];      I SPENT GREATER THAN 75  MINUTES IN CLINICAL CARE TIME/MEDICAL DECISION-MAKING IN COMPLETING THIS ADMISSION.      Nilah Belcourt B Eran Mistry DO [facility]  From 7PM - 7AM   05/04/2024, 10:19 PM

## 2024-05-04 NOTE — ED Triage Notes (Addendum)
 Pt reports numbness & tingling on his left arm & leg that began this morning at 0830. Reports that he also had a period of confusion. Reports that it lasted 5 mins and then went away. Since then he reports he has felt dizzy & weaker than usual. Reports the numbness has returned on the left side & decided to get checked out.  In triage pt has less sensation on the L side.

## 2024-05-04 NOTE — ED Notes (Signed)
 ED Provider at bedside.

## 2024-05-04 NOTE — ED Notes (Signed)
 Patient speaking with neurologist Geetanjali  at this time.

## 2024-05-04 NOTE — ED Provider Notes (Signed)
 [facility] AT [facility] Provider Note   CSN: [id number] Arrival date & time: 05/04/24  8040     Patient presents with: Numbness   Charles Graves is a 43 y.o. male.   43 year old male presents for evaluation of left-sided numbness difficulty with his balance.  States he woke up this morning around 8:30 AM and had some numbness on his left and felt very nauseous.  States that resolved in about 5 minutes.  He states again later in the day around 2:00 he had an episode where his left side was numb he had some trouble walking and some left-sided arm weakness as well.  Also states that this resolved.  Tonight around 715 he developed again left-sided numbness, difficulty with balance and chest pain.  He states this time it has not improved and has been persistent.  He is not on any blood thinners, no history of stroke        Prior to Admission medications   Medication Sig Start Date End Date Taking? Authorizing Provider  albuterol  (VENTOLIN  HFA) 108 (90 Base) MCG/ACT inhaler 1-2 puffs as needed 07/09/23  Yes Waddell Sluder, PA-C  amLODipine -benazepril (LOTREL) 5-40 MG capsule Take 1 capsule by mouth daily. 08/22/23 08/21/24 Yes [provider]  QUEtiapine  (SEROQUEL ) 100 MG tablet Take 1 tablet (100 mg total) by mouth at bedtime. 08/07/22 05/04/24 Yes Mayers, Cari S, PA-C  rizatriptan (MAXALT-MLT) 5 MG disintegrating tablet Take 5 mg by mouth as needed for migraine. 04/17/24  Yes [provider]  rosuvastatin (CRESTOR) 5 MG tablet Take 5 mg by mouth daily. 04/18/24 04/18/25 Yes [provider]  venlafaxine XR (EFFEXOR-XR) 75 MG 24 hr capsule Take 75 mg by mouth daily. 04/17/24  Yes [provider]  benztropine  (COGENTIN ) 2 MG tablet Take 1 tablet (2 mg total) by mouth 2 (two) times daily. Patient not taking: Reported on 07/03/2023 08/21/22   Mayers, Cari S, PA-C  cloNIDine (CATAPRES) 0.1 MG tablet Take 0.1 mg by mouth See admin  instructions.  Take 1 tablet (0.1 mg total) by mouth 2 (two) times a day as needed for high blood pressure (>160/90). Patient not taking: Reported on 05/04/2024 11/23/23   [provider]  DULoxetine  (CYMBALTA ) 60 MG capsule Take 1 capsule (60 mg total) by mouth 2 (two) times daily. Patient not taking: Reported on 07/03/2023 08/21/22   Mayers, Cari S, PA-C  hydrOXYzine  (ATARAX ) 25 MG tablet Take 1/2  - 1 full tablets TID PRN for anxiety Patient not taking: Reported on 07/03/2023 08/21/22   Mayers, Cari S, PA-C  Vitamin D, Ergocalciferol, (DRISDOL) 1.25 MG (50000 UNIT) CAPS capsule Take 50,000 Units by mouth See admin instructions. One capsule twice a week for 6 weeks then once a week Patient not taking: Reported on 05/04/2024 08/30/23   [provider]    Allergies: Oxycodone  hcl and Ativan  [lorazepam ]    Review of Systems  Constitutional:  Negative for chills and fever.  HENT:  Negative for ear pain and sore throat.   Eyes:  Negative for pain and visual disturbance.  Respiratory:  Negative for cough and shortness of breath.   Cardiovascular:  Negative for chest pain and palpitations.  Gastrointestinal:  Negative for abdominal pain and vomiting.  Genitourinary:  Negative for dysuria and hematuria.  Musculoskeletal:  Negative for arthralgias and back pain.  Skin:  Negative for color change and rash.  Neurological:  Positive for numbness. Negative for seizures and syncope.  All other systems reviewed and  are negative.   Updated Vital Signs BP (!) 121/97 (BP Location: Left Arm)   Pulse 76   Temp 98.3 F (36.8 C) (Oral)   Resp 18   SpO2 100%   Physical Exam Vitals and nursing note reviewed.  Constitutional:      General: He is not in acute distress.    Appearance: Normal appearance. He is well-developed. He is not ill-appearing.  HENT:     Head: Normocephalic and atraumatic.  Eyes:     Conjunctiva/sclera: Conjunctivae normal.  Cardiovascular:     Rate and  Rhythm: Normal rate and regular rhythm.     Heart sounds: No murmur heard. Pulmonary:     Effort: Pulmonary effort is normal. No respiratory distress.     Breath sounds: Normal breath sounds.  Abdominal:     Palpations: Abdomen is soft.     Tenderness: There is no abdominal tenderness.  Musculoskeletal:        General: No swelling.     Cervical back: Neck supple.  Skin:    General: Skin is warm and dry.     Capillary Refill: Capillary refill takes less than 2 seconds.  Neurological:     Mental Status: He is alert.     Comments: Sensation deficit on left arm and left leg, ms is 5/5 throughout, no facial droop   Psychiatric:        Mood and Affect: Mood normal.     (all labs ordered are listed, but only abnormal results are displayed) Labs Reviewed  COMPREHENSIVE METABOLIC PANEL WITH GFR - Abnormal; Notable for the following components:      Result Value   Sodium 131 (*)    Potassium 3.4 (*)    AST 13 (*)    All other components within normal limits  URINALYSIS, ROUTINE W REFLEX MICROSCOPIC - Abnormal; Notable for the following components:   Color, Urine STRAW (*)    All other components within normal limits  CBG MONITORING, ED - Abnormal; Notable for the following components:   Glucose-Capillary 112 (*)    All other components within normal limits  CBC  ETHANOL  PROTIME-INR  APTT  RAPID URINE DRUG SCREEN, HOSP PERFORMED  CBC WITH DIFFERENTIAL/PLATELET  COMPREHENSIVE METABOLIC PANEL WITH GFR  MAGNESIUM   MAGNESIUM   I-STAT CHEM 8, ED  TROPONIN I (HIGH SENSITIVITY)    EKG: EKG Interpretation Date/Time:  Sunday May 04 2024 20:30:41 EDT Ventricular Rate:  87 PR Interval:  173 QRS Duration:  94 QT Interval:  350 QTC Calculation: 421 R Axis:   61  Text Interpretation: Sinus rhythm ST elev, probable normal early repol pattern Compared with EKG from 07/27/2022 Confirmed by Gennaro Bouchard ([id number]) on 05/04/2024 9:06:19 PM  Radiology: CT HEAD CODE STROKE WO  CONTRAST Result Date: 05/04/2024 CLINICAL DATA:  Code stroke.  Neuro deficit, acute, stroke suspected EXAM: CT HEAD WITHOUT CONTRAST TECHNIQUE: Contiguous axial images were obtained from the base of the skull through the vertex without intravenous contrast. RADIATION DOSE REDUCTION: This exam was performed according to the departmental dose-optimization program which includes automated exposure control, adjustment of the mA and/or kV according to patient size and/or use of iterative reconstruction technique. COMPARISON:  CT July 03, 2023. FINDINGS: Brain: No evidence of acute large vascular territory infarction, hemorrhage, hydrocephalus, extra-axial collection or mass lesion/mass effect. Vascular: No hyperdense vessel identified. Skull: No acute fracture. Sinuses/Orbits: Clear sinuses.  No acute orbital findings. ASPECTS [facility] Stroke Program Early CT Score) Total score (0-10 with 10 being normal): 10.  IMPRESSION: No evidence of acute intracranial abnormality. ASPECTS is 10. Electronically Signed   By: Gilmore GORMAN Molt M.D.   On: 05/04/2024 20:45     Procedures   Medications Ordered in the ED  acetaminophen  (TYLENOL ) tablet 650 mg (has no administration in time range)    Or  acetaminophen  (TYLENOL ) suppository 650 mg (has no administration in time range)  melatonin tablet 3 mg (has no administration in time range)  ondansetron  (ZOFRAN ) injection 4 mg (has no administration in time range)  aspirin  EC tablet 81 mg (has no administration in time range)  hydrALAZINE  (APRESOLINE ) injection 10 mg (has no administration in time range)                                    Medical Decision Making Cardiac monitor interpretation: Sinus rhythm, no ectopy  Patient symptoms started at 715 as of likely it may have been having TIAs throughout the day as he would have symptoms and they resolved.  His NIH is 1, teleneuro was consulted and recommended admission for MRI and stroke workup.  No TNK at this  time.  Patient's workup largely negative otherwise.  Discussed patient's case with hospitalist and patient to be admitted for further workup and management.  Patient feels comfortable to plan to be admitted.  Problems Addressed: Atypical chest pain: undiagnosed new problem with uncertain prognosis Left sided numbness: undiagnosed new problem with uncertain prognosis  Amount and/or Complexity of Data Reviewed External Data Reviewed: notes.    Details: Prior records reviewed and patient has had numbness in the past  Labs: ordered. Decision-making details documented in ED Course.    Details: Ordered and reviewed by me and unremarkable, troponin negative Radiology: ordered and independent interpretation performed. Decision-making details documented in ED Course.    Details: Ordered and interpreted by me independently radiology Chest x-ray: Shows no acute abnormality on chest CT head: Shows no acute intracranial process ECG/medicine tests: ordered and independent interpretation performed. Decision-making details documented in ED Course.    Details: Ordered and interpreted in the absence of cardiology and shows sinus rhythm, no STEMI or acute change compared to prior EKG Discussion of management or test interpretation with external provider(s): Teleneurology-I spoke with teleneurology on the phone and she recommended admission for stroke workup and MRI but no TNK at this time as patient has a low NIH  Dr. Marcene - hospitalist spoke with him regarding the patient's case and he will admit the patient for further workup and management to his service  Risk OTC drugs. Prescription drug management. Decision regarding hospitalization. Diagnosis or treatment significantly limited by social determinants of health. Risk Details: CRITICAL CARE Performed by: Duwaine LITTIE Fusi   Total critical care time: 35 minutes  Critical care time was exclusive of separately billable procedures and treating other  patients.  Critical care was necessary to treat or prevent imminent or life-threatening deterioration.  Critical care was time spent personally by me on the following activities: development of treatment plan with patient and/or surrogate as well as nursing, discussions with consultants, evaluation of patient's response to treatment, examination of patient, obtaining history from patient or surrogate, ordering and performing treatments and interventions, ordering and review of laboratory studies, ordering and review of radiographic studies, pulse oximetry and re-evaluation of patient's condition.   Critical Care Total time providing critical care: 35 minutes     Final diagnoses:  Left sided numbness  Atypical  chest pain    ED Discharge Orders     None          Gennaro Duwaine CROME, DO 05/04/24 [id number]

## 2024-05-04 NOTE — ED Notes (Signed)
 Patient resting in bed with no new complaints at this time.

## 2024-05-04 NOTE — ED Notes (Signed)
Patient in transport to CT.

## 2024-05-05 DIAGNOSIS — F32A Depression, unspecified: Secondary | ICD-10-CM | POA: Diagnosis present

## 2024-05-05 DIAGNOSIS — E871 Hypo-osmolality and hyponatremia: Secondary | ICD-10-CM | POA: Diagnosis present

## 2024-05-05 DIAGNOSIS — E876 Hypokalemia: Secondary | ICD-10-CM | POA: Diagnosis present

## 2024-05-05 DIAGNOSIS — Z8679 Personal history of other diseases of the circulatory system: Secondary | ICD-10-CM
